# Patient Record
Sex: Male | Born: 1990
Health system: Southern US, Community
[De-identification: ages and names within clinical notes are randomized; demographics above are authoritative.]

## PROBLEM LIST (undated history)

## (undated) ENCOUNTER — Ambulatory Visit (HOSPITAL_COMMUNITY): Discharge status: Absent without leave | Payer: MEDICAID

## (undated) DIAGNOSIS — R569 Unspecified convulsions: Secondary | ICD-10-CM

## (undated) DIAGNOSIS — F819 Developmental disorder of scholastic skills, unspecified: Secondary | ICD-10-CM

## (undated) DIAGNOSIS — J45909 Unspecified asthma, uncomplicated: Secondary | ICD-10-CM

## (undated) DIAGNOSIS — I10 Essential (primary) hypertension: Secondary | ICD-10-CM

## (undated) HISTORY — DX: Developmental disorder of scholastic skills, unspecified: F81.9

## (undated) HISTORY — PX: OTHER SURGICAL HISTORY: SHX169

## (undated) HISTORY — DX: Essential (primary) hypertension: I10

---
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
## 2012-04-16 ENCOUNTER — Encounter (HOSPITAL_COMMUNITY): Payer: Self-pay | Admitting: *Deleted

## 2012-04-16 ENCOUNTER — Emergency Department (HOSPITAL_COMMUNITY)
Admission: EM | Admit: 2012-04-16 | Discharge: 2012-04-16 | Disposition: A | Discharge status: Home / Self Care | Payer: Medicaid Other | Attending: Emergency Medicine | Admitting: Emergency Medicine

## 2012-04-16 DIAGNOSIS — J069 Acute upper respiratory infection, unspecified: Secondary | ICD-10-CM | POA: Insufficient documentation

## 2012-04-16 DIAGNOSIS — R062 Wheezing: Secondary | ICD-10-CM

## 2012-04-16 DIAGNOSIS — Z79899 Other long term (current) drug therapy: Secondary | ICD-10-CM | POA: Insufficient documentation

## 2012-04-16 DIAGNOSIS — IMO0002 Reserved for concepts with insufficient information to code with codable children: Secondary | ICD-10-CM | POA: Insufficient documentation

## 2012-04-16 DIAGNOSIS — G40909 Epilepsy, unspecified, not intractable, without status epilepticus: Secondary | ICD-10-CM | POA: Insufficient documentation

## 2012-04-16 DIAGNOSIS — J45909 Unspecified asthma, uncomplicated: Secondary | ICD-10-CM | POA: Insufficient documentation

## 2012-04-16 DIAGNOSIS — Z8659 Personal history of other mental and behavioral disorders: Secondary | ICD-10-CM | POA: Insufficient documentation

## 2012-04-16 DIAGNOSIS — J3489 Other specified disorders of nose and nasal sinuses: Secondary | ICD-10-CM | POA: Insufficient documentation

## 2012-04-16 DIAGNOSIS — R51 Headache: Secondary | ICD-10-CM | POA: Insufficient documentation

## 2012-04-16 HISTORY — DX: Unspecified asthma, uncomplicated: J45.909

## 2012-04-16 HISTORY — DX: Unspecified convulsions: R56.9

## 2012-04-16 MED ORDER — ALBUTEROL SULFATE (5 MG/ML) 0.5% IN NEBU: 2.5000 mg | INHALATION_SOLUTION | Freq: Four times a day (QID) | RESPIRATORY_TRACT | Status: DC | PRN

## 2012-04-16 NOTE — ED Provider Notes (Signed)
History     CSN: [id number]  Arrival date & time 04/16/12  1825   First MD Initiated Contact with Patient 04/16/12 1955      Chief Complaint  Patient presents with  . Cough  . URI    (Consider location/radiation/quality/duration/timing/severity/associated sxs/prior treatment) HPI Comments: Patient with URI symptoms and wheezing intermittently for the past 2 days has been give OTC Mucinox and and Albuterol neb treatment Has remote Hx asthma  Also has an area on the back of his head that has been sore intermittently for many months without any sign of trauma, redness   Patient is a 21 y.o. male presenting with cough and URI. The history is provided by the patient.  Cough This is a new problem. Associated symptoms include headaches, rhinorrhea and wheezing. Pertinent negatives include no chills.  URI The primary symptoms include headaches, cough and wheezing. Primary symptoms do not include fever, vomiting or rash.  Symptoms associated with the illness include rhinorrhea. The illness is not associated with chills.    Past Medical History  Diagnosis Date  . Mental handicap   . Asthma   . Seizures     History reviewed. No pertinent past surgical history.  History reviewed. No pertinent family history.  History  Substance Use Topics  . Smoking status: Not on file  . Smokeless tobacco: Not on file  . Alcohol Use:       Review of Systems  Constitutional: Negative for fever and chills.  HENT: Positive for rhinorrhea.   Respiratory: Positive for cough and wheezing.   Gastrointestinal: Negative for vomiting.  Skin: Negative for rash and wound.  Neurological: Positive for headaches.    Allergies  Dilantin and Chloral hydrate  Home Medications   Current Outpatient Rx  Name  Route  Sig  Dispense  Refill  . FELBAMATE 600 MG PO TABS   Oral   Take 300-600 mg by mouth 3 (three) times daily. Takes 600mg  in the am, 300mg  in the afternoon and 600mg  in the pm         .  LAMOTRIGINE 150 MG PO TABS   Oral   Take 150 mg by mouth 2 (two) times daily. Take with lamictal 25mg =175mg          . LAMOTRIGINE 25 MG PO TABS   Oral   Take 25 mg by mouth 2 (two) times daily. Take with lamictal 150mg =175mg          . LEVETIRACETAM 250 MG PO TABS   Oral   Take 250 mg by mouth 2 (two) times daily.         Marland Kitchen PREDNISONE 5 MG PO TABS   Oral   Take 5 mg by mouth daily.         . ALBUTEROL SULFATE (5 MG/ML) 0.5% IN NEBU   Nebulization   Take 0.5 mLs (2.5 mg total) by nebulization every 6 (six) hours as needed for wheezing.   20 mL   12     BP 117/70  Pulse 87  Temp 97.8 F (36.6 C) (Oral)  Resp 20  SpO2 96%  Physical Exam  Constitutional: He appears well-developed and well-nourished.       MR no non conversational   HENT:  Head: Normocephalic.    Eyes: Pupils are equal, round, and reactive to light.  Neck: Normal range of motion.  Cardiovascular: Normal rate.   Pulmonary/Chest: No respiratory distress. He has no wheezes. He exhibits no tenderness.  Abdominal: Soft. Bowel sounds are normal.  Musculoskeletal: Normal range of motion.  Neurological: He is alert.  Skin: Skin is warm.    ED Course  Procedures (including critical care time)  Labs Reviewed - No data to display No results found.   1. URI (upper respiratory infection)   2. Wheezing       MDM   URI symptoms with reactive airway         Arman Filter, NP 04/16/12 2048

## 2012-04-16 NOTE — ED Notes (Signed)
Mom reports that pt having cough and cold symptoms since yesterday, gave him a breathing tx at home. No resp distress noted at triage. Pt also complaining recently of headache, pt has a handicap and unable to describe pain or symptoms.

## 2012-04-21 NOTE — ED Provider Notes (Signed)
Medical screening examination/treatment/procedure(s) were performed by non-physician practitioner and as supervising physician I was immediately available for consultation/collaboration.  Alainna Stawicki, MD 04/21/12 0131 

## 2012-10-18 ENCOUNTER — Other Ambulatory Visit: Payer: Self-pay

## 2012-10-18 DIAGNOSIS — G40309 Generalized idiopathic epilepsy and epileptic syndromes, not intractable, without status epilepticus: Secondary | ICD-10-CM

## 2012-10-18 MED ORDER — FELBATOL 600 MG PO TABS: ORAL_TABLET | ORAL | Status: DC

## 2012-10-18 MED ORDER — LAMICTAL 25 MG PO TABS: ORAL_TABLET | ORAL | Status: DC

## 2012-10-18 MED ORDER — LAMICTAL 150 MG PO TABS: ORAL_TABLET | ORAL | Status: DC

## 2012-10-18 NOTE — Telephone Encounter (Signed)
Rxs were sent to [facility] fax # [phone].

## 2012-10-18 NOTE — Telephone Encounter (Signed)
Pharmacy called and said pt transferred Rx's over there and that they need new Rx's with BMN on it.

## 2012-11-07 ENCOUNTER — Telehealth: Payer: Self-pay

## 2012-11-07 DIAGNOSIS — G40309 Generalized idiopathic epilepsy and epileptic syndromes, not intractable, without status epilepticus: Secondary | ICD-10-CM

## 2012-11-07 MED ORDER — PREDNISONE 5 MG PO TABS: ORAL_TABLET | ORAL | Status: DC

## 2012-11-07 MED ORDER — LEVETIRACETAM 500 MG PO TABS: ORAL_TABLET | ORAL | Status: DC

## 2012-11-07 NOTE — Telephone Encounter (Signed)
[name] lvm stating that pt needed refills on Keppra and Prednisone sent to [facility] on [address]. I called mom to see why she would need a refill on Prednisone and she said that Dr. Rexene Edison put child on it at a low dose for seizures and that he has been taking it for years. Please call mom with any questions at [phone].

## 2012-11-07 NOTE — Telephone Encounter (Signed)
The Rx's are ready for [employer]. Please fax to [facility] as requested. TG

## 2012-11-17 ENCOUNTER — Encounter: Payer: Self-pay | Admitting: Family

## 2012-11-17 DIAGNOSIS — R269 Unspecified abnormalities of gait and mobility: Secondary | ICD-10-CM | POA: Insufficient documentation

## 2012-11-17 DIAGNOSIS — R6250 Unspecified lack of expected normal physiological development in childhood: Secondary | ICD-10-CM | POA: Insufficient documentation

## 2012-11-17 DIAGNOSIS — F71 Moderate intellectual disabilities: Secondary | ICD-10-CM | POA: Insufficient documentation

## 2012-11-17 DIAGNOSIS — F848 Other pervasive developmental disorders: Secondary | ICD-10-CM

## 2012-11-17 DIAGNOSIS — G40309 Generalized idiopathic epilepsy and epileptic syndromes, not intractable, without status epilepticus: Secondary | ICD-10-CM | POA: Insufficient documentation

## 2012-11-18 ENCOUNTER — Ambulatory Visit: Payer: Self-pay | Admitting: Family

## 2012-12-29 ENCOUNTER — Other Ambulatory Visit: Payer: Self-pay

## 2012-12-29 DIAGNOSIS — G40309 Generalized idiopathic epilepsy and epileptic syndromes, not intractable, without status epilepticus: Secondary | ICD-10-CM

## 2012-12-29 MED ORDER — LAMICTAL 150 MG PO TABS: ORAL_TABLET | ORAL | Status: DC

## 2012-12-29 MED ORDER — LAMICTAL 25 MG PO TABS: ORAL_TABLET | ORAL | Status: DC

## 2012-12-29 MED ORDER — FELBATOL 600 MG PO TABS: ORAL_TABLET | ORAL | Status: DC

## 2013-01-20 ENCOUNTER — Ambulatory Visit (INDEPENDENT_AMBULATORY_CARE_PROVIDER_SITE_OTHER): Payer: Medicaid Other | Admitting: Family

## 2013-01-20 ENCOUNTER — Encounter: Payer: Self-pay | Admitting: Family

## 2013-01-20 VITALS — BP 120/70 | HR 80 | Ht 66.0 in | Wt 143.4 lb

## 2013-01-20 DIAGNOSIS — G40309 Generalized idiopathic epilepsy and epileptic syndromes, not intractable, without status epilepticus: Secondary | ICD-10-CM

## 2013-01-20 DIAGNOSIS — F848 Other pervasive developmental disorders: Secondary | ICD-10-CM

## 2013-01-20 DIAGNOSIS — F71 Moderate intellectual disabilities: Secondary | ICD-10-CM

## 2013-01-20 DIAGNOSIS — R269 Unspecified abnormalities of gait and mobility: Secondary | ICD-10-CM

## 2013-01-20 NOTE — Patient Instructions (Signed)
Continue your medications without change. If the arm spasms become more prominent, frequent or painful, call and let me know.  Please plan to follow up in 6 months or sooner if needed.

## 2013-01-20 NOTE — Progress Notes (Signed)
Patient: [name] MRN: [id number] Sex: male DOB: 12/11/1990  Provider: Elveria Rising, NP Location of Care: [facility]  Note type: Routine return visit  History of Present Illness: Referral Source: Dr. Maudie Flakes History from: Mother Chief Complaint: Seizures  [name] is a 22 y.o. 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 This was later thought to be gastroesophageal reflux. Nissen fundoplication caused vomiting to stop. He has organic gait disorder, essential tremor, and stuttering. He is taking and tolerating Felbatol, Lamictal and Keppra for his seizure disorder.   His mother says that he has had no overt seizures since last seen in August, 2013. He has had some brief, infrequent involuntary movements of his right arm. These have not changed in frequency or severity. [name] says that they are not painful and that he doesn't notice them.   Mom says that he is doing ok in school. He enjoys playing basketball at home with his friends.  Review of Systems: 12 system review was remarkable for allergies and seizure  Past Medical History  Diagnosis Date  . Intellectual delay   . Asthma   . Seizures    Hospitalizations: yes, Head Injury: no, Nervous System Infections: no, Immunizations up to date: yes Past Medical History Comments: The patient has been treated with broad-spectrum antiepileptic drugs and prednisone since he came to this community in 2004.  He has been admitted to [facility] with recurrent seizures.  He was hospitalized November 08, 2004 with involuntary movements that were choreiform in nature.  He has significant cognitive impairments, pervasive developmental delays in many domains, but no focal or lateralized neurologic findings.  MRI performed October 11, 2002  was normal.  EEG Oct 05 2002 showed diffuse background slowing and multifocal sharply contoured slow-wave is principally in the central, parietal and temporal regions, right greater than left.  He has organic gait disorder, essential tremor, and stuttering,  He had episodic vomiting. Nissen fundoplication caused vomiting to stop.   Surgical History Past Surgical History  Procedure Laterality Date  . Nissen fundoplication  April 2009    Family History family history is not on file. He has a nephew that is being evaluated for possible bipolar disorder. Family History is negative migraines, seizures, cognitive impairment, blindness, deafness, birth defects, chromosomal disorder, autism.  Social History History   Social History  . Marital Status: Single    Spouse Name: N/A    Number of Children: N/A  . Years of Education: N/A   Social History Main Topics  . Smoking status: Never Smoker   . Smokeless tobacco: None  . Alcohol Use: No  . Drug Use: No  . Sexual Activity: No   Other Topics Concern  . None   Social History Narrative  . None   Educational level 12th grade School Attending: [employer]. Occupation: Consulting civil engineer  Living with mother  Hobbies/Interest: Basketball School comments Dreshaun is doing well in school.  Current Outpatient Prescriptions on File Prior to Visit  Medication Sig Dispense Refill  . albuterol (PROVENTIL) (5 MG/ML) 0.5% nebulizer solution Take 0.5 mLs (2.5 mg total) by nebulization every 6 (six) hours as needed for wheezing.  20 mL  12  . FELBATOL 600 MG tablet Take 1 tab by mouth every morning, 1/2 tab every afternoon and 1 tab every evening  78 tablet  0  . LAMICTAL 150 MG tablet Take 1 tab by mouth twice daily.  60 tablet  0  . LAMICTAL 25 MG tablet Take 1 tab by mouth twice daily  60 tablet  0  . levETIRAcetam (KEPPRA) 500 MG tablet Take 1/2 tablet twice per day  Brand Medically Necessary  30 tablet  0  . predniSONE (DELTASONE) 5 MG  tablet Take 1 tab by mouth every morning  31 tablet  0   No current facility-administered medications on file prior to visit.   The medication list was reviewed and reconciled. All changes or newly prescribed medications were explained.  A complete medication list was provided to the patient/caregiver.  Allergies  Allergen Reactions  . Dilantin [Phenytoin Sodium Extended] Other (See Comments)    hallucinations  . Chloral Hydrate Hives and Rash    Physical Exam BP 120/70  Pulse 80  Ht 5\' 6"  (1.676 m)  Wt 143 lb 6.4 oz (65.046 kg)  BMI 23.16 kg/m2 General: alert, well developed, well nourished, in no acute distress brown hair, round eyes, right-handed Head: normocephalic, no dysmorphic features. He has an occipital protuberance that is unchanged. Ears, Nose and Throat: Otoscopic: tympanic membranes normal .  Pharynx: oropharynx is pink without exudates or tonsillar hypertrophy. Neck: supple, full range of motion, no cranial or cervical bruits Respiratory: auscultation clear Cardiovascular: no murmurs, pulses are normal Musculoskeletal: no skeletal deformities or apparent scoliosis Skin: no rashes or neurocutaneous lesions, he has multiple scars on his face.  Neurologic Exam  Mental Status: alert; Poor eye contact, slow to respond to commands, limited verbal output with dysarthria Cranial Nerves: visual fields are full to double simultaneous stimuli; extraocular movements are full and conjugate; pupils are round reactive to light; funduscopic examination shows sharp disc margins with normal vessels; symmetric facial strength; midline tongue and uvula; hearing is intact and symmetric Motor: Normal functional strength, tone, and mass; good fine motor movements; no pronator drift. Sensory: intact responses to touch and temperature  Coordination: good finger-to-nose, rapid repetitive alternating movements and finger apposition   Gait and Station:  broad-based but stable gait and station;  patient has difficulty walking on his heels, toes and tandems with difficulty; balance is poor; Romberg exam is negative; Gower response is negative Reflexes: symmetric and diminished bilaterally; no clonus; bilateral flexor plantar responses.   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 He is taking and tolerating Felbatol, Lamictal and Keppra for his seizure disorder.  He has had some brief, infrequent involuntary movements of his right arm that are not painful or problematic to him. We will make no changes in his plan of care at this time. I will see him back in follow up in 6 months or sooner if needed.

## 2013-02-10 ENCOUNTER — Other Ambulatory Visit: Payer: Self-pay | Admitting: Family

## 2013-02-10 DIAGNOSIS — G40309 Generalized idiopathic epilepsy and epileptic syndromes, not intractable, without status epilepticus: Secondary | ICD-10-CM

## 2013-02-10 DIAGNOSIS — F71 Moderate intellectual disabilities: Secondary | ICD-10-CM

## 2013-02-10 DIAGNOSIS — R269 Unspecified abnormalities of gait and mobility: Secondary | ICD-10-CM

## 2013-02-10 DIAGNOSIS — F849 Pervasive developmental disorder, unspecified: Secondary | ICD-10-CM

## 2013-03-16 ENCOUNTER — Other Ambulatory Visit: Payer: Self-pay

## 2013-03-16 DIAGNOSIS — G40309 Generalized idiopathic epilepsy and epileptic syndromes, not intractable, without status epilepticus: Secondary | ICD-10-CM

## 2013-03-16 MED ORDER — LEVETIRACETAM 500 MG PO TABS: ORAL_TABLET | ORAL | Status: DC

## 2013-03-16 MED ORDER — LAMICTAL 150 MG PO TABS: ORAL_TABLET | ORAL | Status: DC

## 2013-03-16 MED ORDER — LAMICTAL 25 MG PO TABS: ORAL_TABLET | ORAL | Status: DC

## 2013-03-16 MED ORDER — FELBATOL 600 MG PO TABS: ORAL_TABLET | ORAL | Status: DC

## 2013-03-16 MED ORDER — PREDNISONE 5 MG PO TABS: ORAL_TABLET | ORAL | Status: DC

## 2013-04-25 ENCOUNTER — Other Ambulatory Visit: Payer: Self-pay | Admitting: Pediatrics

## 2013-04-25 ENCOUNTER — Ambulatory Visit
Admission: RE | Admit: 2013-04-25 | Discharge: 2013-04-25 | Disposition: A | Discharge status: Home / Self Care | Payer: Medicaid Other | Source: Ambulatory Visit | Attending: Pediatrics | Admitting: Pediatrics

## 2013-04-25 DIAGNOSIS — IMO0001 Reserved for inherently not codable concepts without codable children: Secondary | ICD-10-CM

## 2013-07-31 ENCOUNTER — Ambulatory Visit (INDEPENDENT_AMBULATORY_CARE_PROVIDER_SITE_OTHER): Payer: Medicaid Other | Admitting: Family

## 2013-07-31 ENCOUNTER — Encounter: Payer: Self-pay | Admitting: Family

## 2013-07-31 VITALS — BP 122/68 | HR 82 | Ht 66.0 in | Wt 151.0 lb

## 2013-07-31 DIAGNOSIS — F71 Moderate intellectual disabilities: Secondary | ICD-10-CM

## 2013-07-31 DIAGNOSIS — L708 Other acne: Secondary | ICD-10-CM

## 2013-07-31 DIAGNOSIS — R269 Unspecified abnormalities of gait and mobility: Secondary | ICD-10-CM

## 2013-07-31 DIAGNOSIS — F848 Other pervasive developmental disorders: Secondary | ICD-10-CM

## 2013-07-31 DIAGNOSIS — G40309 Generalized idiopathic epilepsy and epileptic syndromes, not intractable, without status epilepticus: Secondary | ICD-10-CM

## 2013-07-31 DIAGNOSIS — L709 Acne, unspecified: Secondary | ICD-10-CM

## 2013-07-31 NOTE — Progress Notes (Signed)
Patient: [name] MRN: [id number] Sex: male DOB: Sep 14, 1990  Provider: Elveria Rising, NP Location of Care: [facility]  Note type: Routine return visit  History of Present Illness: Referral Source: [name] History from: [name] and [name] Chief Complaint: Seizures  [name] is a 23 y.o. 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 This was later thought to be gastroesophageal reflux. Nissen fundoplication caused vomiting to stop. He has organic gait disorder, essential tremor, and stuttering. He is taking and tolerating Felbatol, Lamictal and Keppra for his seizure disorder.  Today his [name] reports that he has had no seizures since he was last seen in September 2014. He has not had seizures in many years and [name] has made the decision to keep him on medication since he has been stable. He has brief involuntary movements of his right arm that have been ongoing for several years. These have not proven to be seizure activity. [name] says that they do not bother him.   [name] is doing well in school. There are no problems with behavior. He will graduate with a certificate in June. His [name] is unsure what he will do after graduation because of limited finances. She is concerned today because he has acne on his cheeks that has worsened. She says that she has asked his PCP for a referral to a dermatologist. She said that [name] has been otherwise healthy since last seen.   Review of Systems: 12 system review was unremarkable  Past Medical History  Diagnosis Date  . Intellectual delay   . Asthma   . Seizures    Hospitalizations: no, Head Injury: no, Nervous System Infections: no, Immunizations up to date: yes Past Medical History Comments: The  patient has been treated with broad-spectrum antiepileptic drugs and prednisone since he came to this community in 2004. He has been admitted to [facility] with recurrent seizures. He was hospitalized November 08, 2004 with involuntary movements that were choreiform in nature. He has significant cognitive impairments, pervasive developmental delays in many domains, but no focal or lateralized neurologic findings.  MRI performed October 11, 2002 was normal. EEG Oct 05 2002 showed diffuse background slowing and multifocal sharply contoured slow-wave is principally in the central, parietal and temporal regions, right greater than left.  He has organic gait disorder, essential tremor, and stuttering,  He had episodic vomiting. Nissen fundoplication caused vomiting to stop.   Surgical History Past Surgical History  Procedure Laterality Date  . Nissen fundoplication  April 2009    Family History family history is not on file. Family History is otherwise negative for migraines, seizures, cognitive impairment, blindness, deafness, birth defects, chromosomal disorder, autism.  Social History History   Social History  . Marital Status: Single    Spouse Name: N/A    Number of Children: N/A  . Years of Education: N/A   Social History Main Topics  . Smoking status: Never Smoker   . Smokeless tobacco: Never Used  . Alcohol Use: No  . Drug Use: No  . Sexual Activity: No   Other Topics Concern  . None   Social History Narrative  . None   Educational level: 12th grade special education School Attending: [name] [name] Living with:  [name]  Hobbies/Interest: Enjoys playing basketball, riding his bike and he is a  huge wrestling fan.  School comments:  [name] is doing well in school he will graduate June of 2015.  Physical Exam BP 122/68  Pulse 82  Ht 5\' 6"  (1.676 m)  Wt 151 lb (68.493 kg)  BMI 24.38 kg/m2 General: alert, well developed, well nourished, in no acute distress  brown hair, round eyes, right-handed  Head: normocephalic, no dysmorphic features. He has an occipital protuberance that is unchanged.  Ears, Nose and Throat: Otoscopic: tympanic membranes normal . Pharynx: oropharynx is pink without exudates or tonsillar hypertrophy.  Neck: supple, full range of motion, no cranial or cervical bruits  Respiratory: auscultation clear  Cardiovascular: no murmurs, pulses are normal  Musculoskeletal: no skeletal deformities or apparent scoliosis  Skin: no rashes or neurocutaneous lesions, he has acne and multiple scars on his face.   Neurologic Exam  Mental Status: alert; Poor eye contact, slow to respond to commands, limited verbal output with dysarthria  Cranial Nerves: visual fields are full to double simultaneous stimuli; extraocular movements are full and conjugate; pupils are round reactive to light; funduscopic examination shows sharp disc margins with normal vessels; symmetric facial strength; midline tongue and uvula; hearing is intact and symmetric  Motor: Normal functional strength, tone, and mass; good fine motor movements; no pronator drift.  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 He has had no seizures in many years and will continue on his medications without change. I agree with his [name] than he needs to see a dermatologist for  his acne. I will see him back in follow up in 6 months or sooner if needed.

## 2013-08-02 ENCOUNTER — Encounter: Payer: Self-pay | Admitting: Family

## 2013-08-02 DIAGNOSIS — L709 Acne, unspecified: Secondary | ICD-10-CM | POA: Insufficient documentation

## 2013-08-02 NOTE — Patient Instructions (Signed)
Continue [name]' medications without change for now. Let me know if he has any breakthrough seizures.  Please plan to return for follow up in 6 months or sooner if needed.

## 2014-01-16 ENCOUNTER — Telehealth: Payer: Self-pay | Admitting: Family

## 2014-01-16 NOTE — Telephone Encounter (Signed)
Mom [name] left a message about [name] saying that the school switched his bus route and he is riding a bus now for 1+1/2 hrs each way. Mom said that he gets agitated & can't tolerate that amount of time on the bus. She said that Dr Sharene Skeans has written a note before to school saying that he should not ride a bus for no more than 1 hour. She needs a note faxed to attention [employer]ymes fax [phone]. Mom can be reached at  [phone]. I called mom and talked with her. I told her that I would write a note and have Dr Sharene Skeans sign it. TG

## 2014-01-16 NOTE — Telephone Encounter (Signed)
The letter was written and faxed to school as requested. TG

## 2014-01-17 NOTE — Telephone Encounter (Signed)
Thanks

## 2014-01-17 NOTE — Telephone Encounter (Signed)
The fax would not go through so I called Mom and verified the fax number. The correct fax number for the school is [phone]. The letter was faxed as requested. TG

## 2014-01-23 ENCOUNTER — Encounter: Payer: Self-pay | Admitting: Family

## 2014-01-23 ENCOUNTER — Ambulatory Visit (INDEPENDENT_AMBULATORY_CARE_PROVIDER_SITE_OTHER): Payer: Medicaid Other | Admitting: Family

## 2014-01-23 VITALS — BP 118/70 | HR 80 | Ht 66.75 in | Wt 151.4 lb

## 2014-01-23 DIAGNOSIS — F71 Moderate intellectual disabilities: Secondary | ICD-10-CM

## 2014-01-23 DIAGNOSIS — R269 Unspecified abnormalities of gait and mobility: Secondary | ICD-10-CM

## 2014-01-23 DIAGNOSIS — F848 Other pervasive developmental disorders: Secondary | ICD-10-CM

## 2014-01-23 DIAGNOSIS — G40309 Generalized idiopathic epilepsy and epileptic syndromes, not intractable, without status epilepticus: Secondary | ICD-10-CM

## 2014-01-23 NOTE — Progress Notes (Signed)
Patient: [name] MRN: [id number] Sex: male DOB: February 05, 2022  Provider: Elveria Rising, NP Location of Care: [facility]  Note type: Routine return visit  History of Present Illness: Referral Source: Dr. Maudie Flakes History from: his mother Chief Complaint: Seizures  [name] is a 23 y.o. 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 This was later thought to be gastroesophageal reflux. Nissen fundoplication caused vomiting to stop. He has organic gait disorder, essential tremor, and stuttering. [name] was last seen July 31, 2013.   [name] is taking and tolerating Felbatol, Lamictal and Keppra for his seizure disorder. Today his mother reports that he has had no seizures since he was last seen. He has not had seizures in many years and Dr Sharene Skeans has made the decision to keep him on medication since he has been stable. He has brief involuntary movements of his right arm that have been ongoing for several years. These have not proven to be seizure activity. [name] says that they do not bother him.   [name] graduated with a certificate in June, and was eligible to continue to take classes for 3 years so he is doing so. He has been otherwise healthy since last seen.   Review of Systems: 12 system review was unremarkable  Past Medical History  Diagnosis Date  . Intellectual delay   . Asthma   . Seizures    Hospitalizations: No., Head Injury: No., Nervous System Infections: No., Immunizations up to date: Yes.   Past Medical History Comments: The patient has been treated with broad-spectrum antiepileptic drugs and prednisone since he came to this community in 2004. He has been admitted to [facility] with recurrent seizures. He was hospitalized November 08, 2004 with  involuntary movements that were choreiform in nature. He has significant cognitive impairments, pervasive developmental delays in many domains, but no focal or lateralized neurologic findings.  MRI performed October 11, 2002 was normal. EEG Oct 05 2002 showed diffuse background slowing and multifocal sharply contoured slow-wave is principally in the central, parietal and temporal regions, right greater than left.    Surgical History Past Surgical History  Procedure Laterality Date  . Nissen fundoplication  April 2009   Family History family history is not on file. Family History is otherwise negative for migraines, seizures, cognitive impairment, blindness, deafness, birth defects, chromosomal disorder, autism.  Social History History   Social History  . Marital Status: Single    Spouse Name: N/A    Number of Children: N/A  . Years of Education: N/A   Social History Main Topics  . Smoking status: Never Smoker   . Smokeless tobacco: Never Used  . Alcohol Use: No  . Drug Use: No  . Sexual Activity: No   Other Topics Concern  . None   Social History Narrative  . None   Educational level: 12th grade School Attending: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 He is taking courses at his high school.  Physical Exam BP 118/70  Pulse 80  Ht 5' 6.75" (1.695 m)  Wt 151 lb 6.4 oz (68.675 kg)  BMI 23.90 kg/m2 General: alert, well developed, well nourished, in no acute distress brown hair, round eyes, right-handed  Head: normocephalic, no dysmorphic features. He has an occipital protuberance that is unchanged.  Ears, Nose and Throat: Otoscopic: tympanic membranes normal . Pharynx: oropharynx is pink without exudates or tonsillar hypertrophy.  Neck: supple, full range of motion, no cranial or cervical bruits   Respiratory: auscultation clear  Cardiovascular: no murmurs, pulses are normal  Musculoskeletal: no skeletal deformities or apparent scoliosis  Skin: no rashes or neurocutaneous lesions, he has acne and multiple scars on his face.   Neurologic Exam  Mental Status: alert; Poor eye contact, slow to respond to commands, limited verbal output with dysarthria  Cranial Nerves: visual fields are full to double simultaneous stimuli; extraocular movements are full and conjugate; pupils are round reactive to light; funduscopic examination shows sharp disc margins with normal vessels; symmetric facial strength; midline tongue and uvula; hearing is intact and symmetric  Motor: Normal functional strength, tone, and mass; good fine motor movements; no pronator drift.  Sensory: intact responses to touch and temperature  Coordination: good finger-to-nose, rapid repetitive alternating movements and finger apposition  Gait and Station: broad-based but stable gait and station; patient has difficulty walking on his heels, toes and tandems with difficulty; balance is poor; Romberg exam is negative; Gower response is negative  Reflexes: symmetric and diminished bilaterally; no clonus; bilateral flexor plantar responses  Assessment and Plan [name] is a 23 year old young man with a history of infantile spasms and myoclonic seizures that he evolved into a mixed seizure disorder involving complex partial, and atypical absence seizures. He has had no seizures in many years and will continue on his medications without change. I will see him back in follow up in 6 months or sooner if needed.

## 2014-01-25 ENCOUNTER — Encounter: Payer: Self-pay | Admitting: Family

## 2014-01-25 NOTE — Patient Instructions (Signed)
[name] needs to continue his seizure medications without change. Call me if he has any seizures or if you have any concerns.   I will see him in follow up in 6 months or sooner if needed.

## 2014-04-24 ENCOUNTER — Other Ambulatory Visit: Payer: Self-pay

## 2014-04-24 DIAGNOSIS — G40309 Generalized idiopathic epilepsy and epileptic syndromes, not intractable, without status epilepticus: Secondary | ICD-10-CM

## 2014-04-24 MED ORDER — PREDNISONE 5 MG PO TABS: ORAL_TABLET | ORAL | Status: DC

## 2014-04-24 MED ORDER — LAMICTAL 150 MG PO TABS: ORAL_TABLET | ORAL | Status: DC

## 2014-04-24 MED ORDER — LEVETIRACETAM 500 MG PO TABS: ORAL_TABLET | ORAL | Status: DC

## 2014-04-24 MED ORDER — FELBATOL 600 MG PO TABS: ORAL_TABLET | ORAL | Status: DC

## 2014-04-24 MED ORDER — LAMICTAL 25 MG PO TABS
ORAL_TABLET | ORAL | Status: DC
Start: 2014-04-24 — End: 2014-08-20

## 2014-08-20 ENCOUNTER — Encounter: Payer: Self-pay | Admitting: Family

## 2014-08-20 ENCOUNTER — Ambulatory Visit (INDEPENDENT_AMBULATORY_CARE_PROVIDER_SITE_OTHER): Payer: Medicaid Other | Admitting: Family

## 2014-08-20 VITALS — BP 120/68 | HR 82 | Ht 66.75 in | Wt 155.0 lb

## 2014-08-20 DIAGNOSIS — R269 Unspecified abnormalities of gait and mobility: Secondary | ICD-10-CM

## 2014-08-20 DIAGNOSIS — R6250 Unspecified lack of expected normal physiological development in childhood: Secondary | ICD-10-CM

## 2014-08-20 DIAGNOSIS — F71 Moderate intellectual disabilities: Secondary | ICD-10-CM | POA: Diagnosis not present

## 2014-08-20 DIAGNOSIS — G40309 Generalized idiopathic epilepsy and epileptic syndromes, not intractable, without status epilepticus: Secondary | ICD-10-CM | POA: Diagnosis not present

## 2014-08-20 MED ORDER — LAMICTAL 150 MG PO TABS: ORAL_TABLET | ORAL | Status: DC

## 2014-08-20 MED ORDER — FELBATOL 600 MG PO TABS: ORAL_TABLET | ORAL | Status: DC

## 2014-08-20 MED ORDER — LAMICTAL 25 MG PO TABS: ORAL_TABLET | ORAL | Status: DC

## 2014-08-20 MED ORDER — KEPPRA 500 MG PO TABS: ORAL_TABLET | ORAL | Status: DC

## 2014-08-20 NOTE — Progress Notes (Signed)
Patient: [name] MRN: [id number] Sex: male DOB: 1990-12-18  Provider: Elveria Rising, NP Location of Care: [facility]  Note type: Routine return visit  History of Present Illness: Referral Source: Dr. Maudie Flakes  History from: patient and [facility] chart Chief Complaint: Seizures  [name] is a 24 y.o. 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 This was later thought to be gastroesophageal reflux. Nissen fundoplication caused vomiting to stop. He has organic gait disorder, essential tremor, and stuttering. He is taking and tolerating Felbatol, Lamictal and Keppra for his seizure disorder and had been seizure free for several years until this winter. [name] was last seen January 23, 2014.  Today his mother reports that he had 1 brief seizure since he was last seen. She said that it happened in January or February when he had the flu. [name] has brief involuntary movements of his right arm that have been ongoing for several years. These have not proven to be seizure activity and are not problematic to him.   [name] has been otherwise healthy since last seen. His mother's only concern today is about his vision, and she wonders if he should have an evaluation by an opthalmologist. She says that he has not had complaints about his vision, but that she wonders if he needs an evaluation as he has never had a formal eye examination.  Review of Systems: Please see the HPI for neurologic and other pertinent review of systems. Otherwise, the following systems are noncontributory including constitutional, eyes, ears, nose and throat, cardiovascular, respiratory, gastrointestinal, genitourinary, musculoskeletal, skin, endocrine, hematologic/lymph, allergic/immunologic and psychiatric.   Past  Medical History  Diagnosis Date  . Intellectual delay   . Asthma   . Seizures    Hospitalizations: No., Head Injury: No., Nervous System Infections: No., Immunizations up to date: Yes.   Past Medical History Comments:The patient has been treated with broad-spectrum antiepileptic drugs and prednisone since he came to this community in 2004. He has been admitted to [facility] with recurrent seizures. He was hospitalized November 08, 2004 with involuntary movements that were choreiform in nature. He has significant cognitive impairments, pervasive developmental delays in many domains, but no focal or lateralized neurologic findings.  MRI performed October 11, 2002 was normal. EEG Oct 05 2002 showed diffuse background slowing and multifocal sharply contoured slow-wave is principally in the central, parietal and temporal regions, right greater than left.   Surgical History Past Surgical History  Procedure Laterality Date  . Nissen fundoplication  April 2009    Family History family history is not on file. Family History is otherwise negative for migraines, seizures, cognitive impairment, blindness, deafness, birth defects, chromosomal disorder, autism.  Social History History   Social History  . Marital Status: Single    Spouse Name: N/A  . Number of Children: N/A  . Years of Education: N/A   Social History Main Topics  . Smoking status: Never Smoker   . Smokeless tobacco: Never Used  . Alcohol Use: No  . Drug Use: No  . Sexual Activity: No   Other Topics Concern  . None   Social History Narrative   Educational level: 12th grade School Attending: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 School  comments:  Master's mother reports that he is doing great in school in special education classes.  Allergies Allergies  Allergen Reactions  .  Dilantin [Phenytoin Sodium Extended] Other (See Comments)    hallucinations  . Chloral Hydrate Hives and Rash    Physical Exam BP 120/68 mmHg  Pulse 82  Ht 5' 6.75" (1.695 m)  Wt 155 lb (70.308 kg)  BMI 24.47 kg/m2 General: alert, well developed, well nourished, in no acute distress brown hair, round eyes, right-handed  Head: normocephalic, no dysmorphic features. He has an occipital protuberance that is unchanged.  Ears, Nose and Throat: Otoscopic: tympanic membranes normal . Pharynx: oropharynx is pink without exudates or tonsillar hypertrophy.  Neck: supple, full range of motion, no cranial or cervical bruits  Respiratory: auscultation clear  Cardiovascular: no murmurs, pulses are normal  Musculoskeletal: no skeletal deformities or apparent scoliosis  Skin: no rashes or neurocutaneous lesions, he has acne and multiple scars on his face.   Neurologic Exam  Mental Status: alert; Poor eye contact, slow to respond to commands, limited verbal output with dysarthria  Cranial Nerves: visual fields are full to double simultaneous stimuli; extraocular movements are full and conjugate; pupils are round reactive to light; funduscopic examination shows sharp disc margins with normal vessels; symmetric facial strength; midline tongue and uvula; hearing is intact and symmetric  Motor: Normal functional strength, tone, and mass; good fine motor movements; no pronator drift.  Sensory: intact responses to touch and temperature  Coordination: good finger-to-nose, rapid repetitive alternating movements and finger apposition  Gait and Station: broad-based but stable gait and station; patient has difficulty walking on his heels, toes and tandems with difficulty; balance is poor; Romberg exam is negative; Gower response is negative  Reflexes: symmetric and diminished bilaterally; no clonus; bilateral flexor plantar responses  Impression 1. 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 History of infantile spasms and myoclonic seizures 3. Cognitive delay 4. Organic gait disorder 5. Benign essential tremor 6. Speech disorder with dysarthria and stuttering   Recommendations for plan of care [facility]. [name] has neither had nor required imaging or lab studies since the last visit. 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 He had one seizure in January or February when he had the flu. We will make no changes in his medication since the seizure was triggered by illness. He will continue on his medications without change for now. I asked his mother to let me know if he has any more seizures. I talked with his mother about her question regarding him seeing an ophthalmologist and told her that it is good practice for adults to have an eye examination periodically. She will discuss a referral with his PCP. I will otherwise see him back in follow up in 6 months or sooner if needed.  The medication list was reviewed and reconciled.  No changes were made in the prescribed medications today.  A complete medication list was provided to his mother.  Total time spent with the patient was 25 minutes, of which 50% or more was spent in counseling and coordination of care.

## 2014-08-21 NOTE — Patient Instructions (Signed)
Continue giving [name] the medications for seizures as you have been giving them. Let me know if he has any more seizures.  Talk to his PCP about your questions regarding a referral for an eye examination.  Please plan to return for follow up in 6 months or sooner if needed.

## 2015-02-25 ENCOUNTER — Ambulatory Visit (INDEPENDENT_AMBULATORY_CARE_PROVIDER_SITE_OTHER): Payer: Medicaid Other | Admitting: Family

## 2015-02-25 ENCOUNTER — Encounter: Payer: Self-pay | Admitting: Family

## 2015-02-25 VITALS — BP 124/76 | HR 84 | Ht 66.0 in | Wt 154.4 lb

## 2015-02-25 DIAGNOSIS — R269 Unspecified abnormalities of gait and mobility: Secondary | ICD-10-CM

## 2015-02-25 DIAGNOSIS — G40309 Generalized idiopathic epilepsy and epileptic syndromes, not intractable, without status epilepticus: Secondary | ICD-10-CM

## 2015-02-25 DIAGNOSIS — R6250 Unspecified lack of expected normal physiological development in childhood: Secondary | ICD-10-CM

## 2015-02-25 DIAGNOSIS — F71 Moderate intellectual disabilities: Secondary | ICD-10-CM | POA: Diagnosis not present

## 2015-02-25 MED ORDER — PREDNISONE 5 MG PO TABS: ORAL_TABLET | ORAL | Status: DC

## 2015-02-25 MED ORDER — FELBATOL 600 MG PO TABS: ORAL_TABLET | ORAL | Status: DC

## 2015-02-25 MED ORDER — LAMICTAL 25 MG PO TABS: ORAL_TABLET | ORAL | Status: DC

## 2015-02-25 MED ORDER — LAMICTAL 150 MG PO TABS
ORAL_TABLET | ORAL | Status: DC
Start: 2015-02-25 — End: 2015-09-23

## 2015-02-25 MED ORDER — KEPPRA 500 MG PO TABS: ORAL_TABLET | ORAL | Status: DC

## 2015-02-25 NOTE — Progress Notes (Signed)
Patient: Rad Gramling MRN: [id number] Sex: male DOB: August 01, 1990  Provider: Elveria Rising, NP Location of Care: [facility]  Note type: Routine return visit  History of Present Illness: Referral Source: Dr. Theadore Nan History from: mother and [facility] chart Chief Complaint: Seizures  Ben Habermann is a 24 y.o. 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 This was later thought to be gastroesophageal reflux. Nissen fundoplication caused vomiting to stop. He has organic gait disorder, essential tremor, and stuttering. He is taking and tolerating Felbatol, Lamictal and Keppra for his seizure disorder and had been seizure free for several years until this winter. Aubra was last seen August 20, 2014.  Today his mother reports that Avraj has had no seizures since he was last seen. Tylan has brief involuntary movements of his right arm that have been ongoing for several years. These have not proven to be seizure activity and are not problematic to him. He sometimes complains of generalized scalp soreness but there are no visible scalp lesions present.   Davidmichael has been otherwise healthy since last seen. His mother has no other health concerns for him today other than previously mentioned.  Review of Systems: Please see the HPI for neurologic and other pertinent review of systems. Otherwise, the following systems are noncontributory including constitutional, eyes, ears, nose and throat, cardiovascular, respiratory, gastrointestinal, genitourinary, musculoskeletal, skin, endocrine, hematologic/lymph, allergic/immunologic and psychiatric.   Past Medical History  Diagnosis Date  . Intellectual delay   . Asthma   . Seizures (HCC)    Hospitalizations: No., Head Injury: No., Nervous System  Infections: No., Immunizations up to date: Yes.   Past Medical History Comments: The patient has been treated with broad-spectrum antiepileptic drugs and prednisone since he came to this community in 2004. He has been admitted to [facility] with recurrent seizures. He was hospitalized November 08, 2004 with involuntary movements that were choreiform in nature. He has significant cognitive impairments, pervasive developmental delays in many domains, but no focal or lateralized neurologic findings.  MRI performed October 11, 2002 was normal. EEG Oct 05 2002 showed diffuse background slowing and multifocal sharply contoured slow-wave is principally in the central, parietal and temporal regions, right greater than left.   Surgical History Past Surgical History  Procedure Laterality Date  . Nissen fundoplication  April 2009    Family History family history is not on file. Family History is otherwise negative for migraines, seizures, cognitive impairment, blindness, deafness, birth defects, chromosomal disorder, autism.  Allergies Allergies  Allergen Reactions  . Dilantin [Phenytoin Sodium Extended] Other (See Comments)    hallucinations  . Chloral Hydrate Hives and Rash    Physical Exam Ht  (1.676 m)  Wt 154 lb 6.4 oz (70.035 kg)  BMI 24.93 kg/m2 General: alert, well developed, well nourished, in no acute distress brown hair, round eyes, right-handed  Head: normocephalic, no dysmorphic features. He has an occipital protuberance that is unchanged.  Ears, Nose and Throat: Otoscopic: tympanic membranes normal . Pharynx: oropharynx is pink without exudates or tonsillar hypertrophy.  Neck: supple, full range of motion, no cranial or cervical bruits  Respiratory: auscultation clear  Cardiovascular: no murmurs, pulses are normal  Musculoskeletal: no skeletal deformities or apparent scoliosis  Skin: no rashes or neurocutaneous lesions, he has acne and multiple scars on his face.    Neurologic Exam  Mental Status: alert; Poor eye contact, slow to respond to commands, limited verbal output with dysarthria  Cranial Nerves: visual fields are full to double simultaneous stimuli; extraocular movements are full and conjugate; pupils are round reactive to light; funduscopic examination shows sharp disc margins with normal vessels; symmetric facial strength; midline tongue and uvula; hearing is intact and symmetric  Motor: Normal functional strength, tone, and mass; good fine motor movements; no pronator drift.  Sensory: intact responses to touch and temperature  Coordination: good finger-to-nose, rapid repetitive alternating movements and finger apposition  Gait and Station: broad-based but stable gait and station; patient has difficulty walking on his heels, toes and tandems with difficulty; balance is poor; Romberg exam is negative; Gower response is negative  Reflexes: symmetric and diminished bilaterally; no clonus; bilateral flexor plantar responses  Impression 1. 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 History of infantile spasms and myoclonic seizures 3. Cognitive delay 4. Organic gait disorder 5. Benign essential tremor 6. Speech disorder with dysarthria and stuttering   Recommendations for plan of care [facility]. [name] has neither had nor required imaging or lab studies since the last visit. 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 He has had no seizures since last seen in April 2016. He will continue on his medications without change for now. I asked his mother to let me know if he has any breakthrough seizures. I will see him back in follow up in  6 months or sooner if needed  The medication list was reviewed and reconciled.  No changes were made in the prescribed medications today.  A complete medication list was provided to the patient's mother.  Total time spent with the patient was 20 minutes, of which 50% or more was spent in counseling and coordination of care.

## 2015-02-27 NOTE — Patient Instructions (Signed)
[name] should continue his medication as he has been taking it. Let me know if he has any seizures or if you have any other concerns.   Please plan to return for follow up in 6 months or sooner if needed.

## 2015-08-16 ENCOUNTER — Encounter: Payer: Self-pay | Admitting: Family

## 2015-09-23 ENCOUNTER — Encounter: Payer: Self-pay | Admitting: Pediatrics

## 2015-09-23 ENCOUNTER — Ambulatory Visit (INDEPENDENT_AMBULATORY_CARE_PROVIDER_SITE_OTHER): Payer: Medicaid Other | Admitting: Pediatrics

## 2015-09-23 VITALS — BP 110/80 | HR 80 | Ht 66.0 in | Wt 157.8 lb

## 2015-09-23 DIAGNOSIS — F71 Moderate intellectual disabilities: Secondary | ICD-10-CM | POA: Diagnosis not present

## 2015-09-23 DIAGNOSIS — G40309 Generalized idiopathic epilepsy and epileptic syndromes, not intractable, without status epilepticus: Secondary | ICD-10-CM | POA: Diagnosis not present

## 2015-09-23 MED ORDER — FELBATOL 600 MG PO TABS: ORAL_TABLET | ORAL | Status: DC

## 2015-09-23 MED ORDER — PREDNISONE 5 MG PO TABS: ORAL_TABLET | ORAL | Status: DC

## 2015-09-23 MED ORDER — LAMICTAL 25 MG PO TABS: ORAL_TABLET | ORAL | Status: DC

## 2015-09-23 MED ORDER — KEPPRA 500 MG PO TABS: ORAL_TABLET | ORAL | Status: DC

## 2015-09-23 MED ORDER — LAMICTAL 150 MG PO TABS: ORAL_TABLET | ORAL | Status: DC

## 2015-09-23 NOTE — Progress Notes (Signed)
Patient: [name] MRN: [id number] Sex: male DOB: 04/12/1991  Provider: Deetta PerlaHICKLING,WILLIAM H, MD [facility]  Note type: Routine return visit  History of Present Illness: Referral Source: Dr. Theadore NanHilary McCormick History from: mother, patient and [facility] Chief Complaint: Seizures  [name] is a 25 y.o. male who returns on Sep 23, 2015 for the first time since February 25, 2015.  I have not seen him in a while.  He has been followed by my nurse practitioner, Elveria Risingina Goodpasture.  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  He is on three antiepileptic medications as well as prednisone and has been unwilling to take him off that medication for all these years because it is not causing Cushingoid response and every time it was discontinued he started to have seizures.  His mother says that he has episodes where he will flex his hand at the wrist and seem to be unable to control it.  I do not think that dystonic posturing occurs when he loses consciousness and I suspect that it is not a seizure.  This was mentioned back in October and was thought not represent a seizure.  Given that he has not lost contact with his world or had a convulsion, I do not want to change his medications.  This is his last year it has been high school.  He will be graduating soon and his mother is excited about taking to the prom.  He has a Dance movement psychotherapistmentor who has been working with him and trying to acquire gainful skills for work.  His health has been good.  He is sleeping well.  No other concerns were raised today.  Review of Systems: 12 system review was assessed and was negative  Past Medical History Diagnosis Date  . Intellectual delay   . Asthma   . Seizures (HCC)    Hospitalizations: No., Head Injury: No., Nervous  System Infections: No., Immunizations up to date: Yes.    Treated with broad-spectrum antiepileptic drugs and prednisone since he came to this community in 2004. [facility] with recurrent seizures. He was hospitalized November 08, 2004 with involuntary movements that were choreiform in nature. He has significant cognitive impairments, pervasive developmental delays in many domains, but no focal or lateralized neurologic findings.  MRI performed October 11, 2002 was normal. EEG Oct 05 2002 showed diffuse background slowing and multifocal sharply contoured slow-wave is principally in the central, parietal and temporal regions, right greater than left.   Behavior History none  Surgical History Procedure Laterality Date  . Nissen fundoplication  April 2009   Family History family history is not on file. Family history is negative for migraines, seizures, intellectual disabilities, blindness, deafness, birth defects, chromosomal disorder, or autism.  Social History . Marital Status: Single    Spouse Name: N/A  . Number of Children: N/A  . Years of Education: N/A   Social History Main Topics  . Smoking status: Never Smoker   . Smokeless tobacco: Never Used  . Alcohol Use: No  . Drug Use: No  . Sexual Activity: No   Social History Narrative    [name] is a 12th grade student at [employer], he does well in school. He lives with his mother, sister, and niece. He enjoys basketball, riding his bike, and watching wrestling.  Allergies Allergen Reactions  . Dilantin [Phenytoin Sodium Extended] Other (See Comments)    hallucinations  . Chloral Hydrate Hives and Rash   Physical Exam BP 110/80 mmHg  Pulse 80  Ht  (1.676 m)  Wt 157 lb 12.8 oz (71.578 kg)  BMI 25.48 kg/m2  General: alert, well developed, well nourished, in no acute distress, brown hair, brown eyes, right handed Head: normocephalic, no dysmorphic features Ears, Nose and Throat:  Otoscopic: tympanic membranes normal; pharynx: oropharynx is pink without exudates or tonsillar hypertrophy Neck: supple, full range of motion, no cranial or cervical bruits Respiratory: auscultation clear Cardiovascular: no murmurs, pulses are normal Musculoskeletal: no skeletal deformities or apparent scoliosis Skin: no neurocutaneous lesions  Neurologic Exam  Mental Status: alert; oriented to person; knowledge is belownormal for age; language is below normal; dysarthria Cranial Nerves: visual fields are full to double simultaneous stimuli; extraocular movements are full and conjugate; pupils are round reactive to light; funduscopic examination shows sharp disc margins with normal vessels; symmetric facial strength; midline tongue and uvula; air conduction is greater than bone conduction bilaterally Motor: Normal strength, tone and mass; good fine motor movements; no pronator drift Sensory: intact responses to cold, vibration, proprioception and stereognosis Coordination: good finger-to-nose, rapid repetitive alternating movements and finger apposition Gait and Station: normal gait and station: patient has difficulty walking on heels, toes and tandem with difficulty; balance is poor; Romberg exam is negative; Gower response is negative Reflexes: symmetric and diminished bilaterally; no clonus; bilateral flexor plantar responses  Assessment 1. 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 3. 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  Discussion I am pleased with how [name] is doing.    Plan I refilled prescriptions for Felbatol, Keppra, and both Lamictal tablets as well as prednisone.  The latter was electronically sent; the other four because they are trade drug were printed.  He will return to see me in six months' time.  I spent 30 minutes of face-to-face time with Zi and his mother, more than half of it in consultation.   Medication  List   albuterol (5 MG/ML) 0.5% nebulizer solution  Commonly known as:  PROVENTIL  Take 0.5 mLs (2.5 mg total) by nebulization every 6 (six) hours as needed for wheezing.     AMBULATORY NON FORMULARY MEDICATION  Clavaris  daily for acne     cetirizine 10 MG tablet  Commonly known as:  ZYRTEC  Take 10 mg by mouth as needed.     FELBATOL 600 MG tablet  Generic drug:  felbamate  Take 1 tab by mouth every morning, 1/2 tab every afternoon and 1 tab every evening     ibuprofen 400 MG tablet  Commonly known as:  ADVIL,MOTRIN  Reported on 09/23/2015     KEPPRA 500 MG tablet  Generic drug:  levETIRAcetam  Take 1/2 tablet twice per day     LAMICTAL 25 MG tablet  Generic drug:  lamoTRIgine  Take 1 tab by mouth twice daily     LAMICTAL 150 MG tablet  Generic drug:  lamoTRIgine  Take 1 tab by mouth twice daily.     predniSONE 5 MG tablet  Commonly known as:  DELTASONE  Take 1 tab by mouth every morning      The medication list was reviewed and reconciled. All changes or newly prescribed medications were explained.  A complete medication list was provided to the patient/caregiver.  Deetta Perla MD

## 2015-10-10 ENCOUNTER — Emergency Department (HOSPITAL_COMMUNITY)
Admission: EM | Admit: 2015-10-10 | Discharge: 2015-10-10 | Disposition: A | Discharge status: Home / Self Care | Payer: Medicaid Other | Attending: Emergency Medicine | Admitting: Emergency Medicine

## 2015-10-10 ENCOUNTER — Encounter (HOSPITAL_COMMUNITY): Payer: Self-pay

## 2015-10-10 DIAGNOSIS — R569 Unspecified convulsions: Secondary | ICD-10-CM | POA: Diagnosis present

## 2015-10-10 DIAGNOSIS — J45909 Unspecified asthma, uncomplicated: Secondary | ICD-10-CM | POA: Insufficient documentation

## 2015-10-10 DIAGNOSIS — Z7952 Long term (current) use of systemic steroids: Secondary | ICD-10-CM | POA: Insufficient documentation

## 2015-10-10 DIAGNOSIS — Z8659 Personal history of other mental and behavioral disorders: Secondary | ICD-10-CM | POA: Insufficient documentation

## 2015-10-10 DIAGNOSIS — Z79899 Other long term (current) drug therapy: Secondary | ICD-10-CM | POA: Diagnosis not present

## 2015-10-10 LAB — BASIC METABOLIC PANEL
ANION GAP: 8 (ref 5–15)
BUN: 13 mg/dL (ref 6–20)
CALCIUM: 9.6 mg/dL (ref 8.9–10.3)
CHLORIDE: 102 mmol/L (ref 101–111)
CO2: 27 mmol/L (ref 22–32)
Creatinine, Ser: 0.96 mg/dL (ref 0.61–1.24)
GFR calc non Af Amer: >60 mL/min (ref >60)
GLUCOSE: 95 mg/dL (ref 65–99)
Potassium: 4 mmol/L (ref 3.5–5.1)
Sodium: 137 mmol/L (ref 135–145)

## 2015-10-10 LAB — CBC WITH DIFFERENTIAL/PLATELET
BASOS PCT: 1 %
Basophils Absolute: 0.1 10*3/uL (ref 0.0–0.1)
Eosinophils Absolute: 0.1 10*3/uL (ref 0.0–0.7)
Eosinophils Relative: 1 %
HEMATOCRIT: 48.3 % (ref 39.0–52.0)
HEMOGLOBIN: 16.7 g/dL (ref 13.0–17.0)
LYMPHS ABS: 2 10*3/uL (ref 0.7–4.0)
LYMPHS PCT: 19 %
MCH: 30.7 pg (ref 26.0–34.0)
MCHC: 34.6 g/dL (ref 30.0–36.0)
MCV: 88.8 fL (ref 78.0–100.0)
MONOS PCT: 11 %
Monocytes Absolute: 1.1 10*3/uL — ABNORMAL HIGH (ref 0.1–1.0)
NEUTROS ABS: 7.4 10*3/uL (ref 1.7–7.7)
NEUTROS PCT: 68 %
Platelets: 347 10*3/uL (ref 150–400)
RBC: 5.44 MIL/uL (ref 4.22–5.81)
RDW: 12.8 % (ref 11.5–15.5)
WBC: 10.7 10*3/uL — ABNORMAL HIGH (ref 4.0–10.5)

## 2015-10-10 NOTE — ED Provider Notes (Signed)
CSN: [id number]     Arrival date & time 10/10/15  1820 History   First MD Initiated Contact with Patient 10/10/15 2149     Chief Complaint  Patient presents with  . possible seizure      (Consider location/radiation/quality/duration/timing/severity/associated sxs/prior Treatment) HPI   Pt is a 25 yo male with PMH of seizures, asthma and intellectual delay who presents to the ED accompanied by his mother with complaint of seizure, onset 6pm. Mother reports the pt was with his mentor earlier today who reported to the pt's mother that the pt had an episode where his nose started running, head leaned forward, lips turned blue and arms started twitching intermittently. Mentor reported the episode lasted for a few minutes and then resolved. Mother reports the pt was then dropped off at her house and she notes he appeared more fatigued tonight. She reports the pt having a hx of seizures and notes he has a variety of seizures ranging from full body convulsions to head leaning forward/twitching. She notes he has been taking his seizure medications as prescribed. Mother denies fever, cough, SOB, vomiting, diarrhea, weakness.   Neurologist Dr. Sharene SkeansHickling.   Level V caveat- intellectual delay, pt nonverbal at baseline  Past Medical History  Diagnosis Date  . Intellectual delay   . Asthma   . Seizures [facility](HCC)    Past Surgical History  Procedure Laterality Date  . Nissen fundoplication  April 2009   No family history on file. Social History  Substance Use Topics  . Smoking status: Never Smoker   . Smokeless tobacco: Never Used  . Alcohol Use: No    Review of Systems  Unable to perform ROS: Patient nonverbal      Allergies  Dilantin and Chloral hydrate  Home Medications   Prior to Admission medications   Medication Sig Start Date End Date Taking? Authorizing Provider  albuterol (PROVENTIL) (5 MG/ML) 0.5% nebulizer solution Take 0.5 mLs (2.5 mg total) by nebulization every 6 (six) hours  as needed for wheezing. 04/16/12   Earley FavorGail Schulz, NP  AMBULATORY NON FORMULARY MEDICATION Clavaris 40mg  daily for acne    Historical Provider, MD  cetirizine (ZYRTEC) 10 MG tablet Take 10 mg by mouth as needed.  05/29/14   Historical Provider, MD  FELBATOL 600 MG tablet Take 1 tab by mouth every morning, 1/2 tab every afternoon and 1 tab every evening 09/23/15   Deetta PerlaWilliam H Hickling, MD  KEPPRA 500 MG tablet Take 1/2 tablet twice per day 09/23/15   Deetta PerlaWilliam H Hickling, MD  LAMICTAL 150 MG tablet Take 1 tab by mouth twice daily. 09/23/15   Deetta PerlaWilliam H Hickling, MD  LAMICTAL 25 MG tablet Take 1 tab by mouth twice daily 09/23/15   Deetta PerlaWilliam H Hickling, MD  predniSONE (DELTASONE) 5 MG tablet Take 1 tab by mouth every morning 09/23/15   Deetta PerlaWilliam H Hickling, MD   BP 124/84 mmHg  Pulse 102  Temp(Src) 98.5 F (36.9 C) (Oral)  Resp 14  Wt 71.215 kg  SpO2 99% Physical Exam  Constitutional: He is oriented to person, place, and time. He appears well-developed and well-nourished. No distress.  Pt non verbal  HENT:  Head: Normocephalic and atraumatic.  Mouth/Throat: Oropharynx is clear and moist. No oropharyngeal exudate.  Eyes: Conjunctivae and EOM are normal. Pupils are equal, round, and reactive to light. Right eye exhibits no discharge. Left eye exhibits no discharge. No scleral icterus.  Neck: Normal range of motion. Neck supple.  Cardiovascular: Normal rate, regular rhythm, normal  heart sounds and intact distal pulses.   HR 96  Pulmonary/Chest: Effort normal and breath sounds normal. No respiratory distress. He has no wheezes. He has no rales. He exhibits no tenderness.  Abdominal: Soft. Bowel sounds are normal. He exhibits no distension and no mass. There is no tenderness. There is no rebound and no guarding.  Musculoskeletal: Normal range of motion. He exhibits no edema.  Lymphadenopathy:    He has no cervical adenopathy.  Neurological: He is alert and oriented to person, place, and time. He has normal  strength. No cranial nerve deficit or sensory deficit. He displays a negative Romberg sign. Coordination normal.  Skin: Skin is warm and dry. He is not diaphoretic.  Nursing note and vitals reviewed.   ED Course  Procedures (including critical care time) Labs Review Labs Reviewed  CBC WITH DIFFERENTIAL/PLATELET - Abnormal; Notable for the following:    WBC 10.7 (*)    Monocytes Absolute 1.1 (*)    All other components within normal limits  BASIC METABOLIC PANEL    Imaging Review No results found. I have personally reviewed and evaluated these images and lab results as part of my medical decision-making.   EKG Interpretation None      MDM   Final diagnoses:  Seizure (HCC)    Pt presents s/p reported seizure. Hx of seizures and intellectual delay, pt nonverbal at baseline. Mother reports pt has been taking his seizure meds as prescribed. Patient with no evidence of focal neuro deficits on physical exam and is at mental baseline.  Labs unremarkable.  Patient is advised to followup with neurologist in regards to today's event.  Mother verbalizes understanding.  Answered all questions.  Patient is hemodynamically stable and in no acute distress prior to discharge.      Satira Sark [place], [place] 10/10/15 2225  Jerelyn Scott, MD 10/10/15 2226

## 2015-10-10 NOTE — ED Notes (Signed)
Mother here with patient who reports that patient was with his mentor this evening and developed nose running and head leaned forward and lips turned blue with some twitching. Patient does have seizure disorder. On arrival patient alert, ambulatory to triage with steady gait. Lips pink, color normal.

## 2015-10-10 NOTE — Discharge Instructions (Signed)
Continue taking your home medications as prescribed. Follow up with your neurologist, Dr. Sharene SkeansHickling, within the next week for follow up and further management of your seizures. Please return to the Emergency Department if symptoms worsen or new onset of fever, change in behavior/mental status, unresponsive, vomiting, difficulty breathing, weakness.

## 2016-03-16 ENCOUNTER — Encounter (HOSPITAL_COMMUNITY): Payer: Self-pay | Admitting: Emergency Medicine

## 2016-03-16 DIAGNOSIS — J45909 Unspecified asthma, uncomplicated: Secondary | ICD-10-CM | POA: Insufficient documentation

## 2016-03-16 DIAGNOSIS — K529 Noninfective gastroenteritis and colitis, unspecified: Secondary | ICD-10-CM | POA: Diagnosis not present

## 2016-03-16 DIAGNOSIS — Z79899 Other long term (current) drug therapy: Secondary | ICD-10-CM | POA: Diagnosis not present

## 2016-03-16 DIAGNOSIS — R109 Unspecified abdominal pain: Secondary | ICD-10-CM | POA: Diagnosis present

## 2016-03-16 LAB — COMPREHENSIVE METABOLIC PANEL
ALBUMIN: 4.1 g/dL (ref 3.5–5.0)
ALK PHOS: 89 U/L (ref 38–126)
ALT: 16 U/L — ABNORMAL LOW (ref 17–63)
ANION GAP: 9 (ref 5–15)
AST: 19 U/L (ref 15–41)
BILIRUBIN TOTAL: 0.4 mg/dL (ref 0.3–1.2)
BUN: 9 mg/dL (ref 6–20)
CALCIUM: 8.9 mg/dL (ref 8.9–10.3)
CO2: 24 mmol/L (ref 22–32)
Chloride: 106 mmol/L (ref 101–111)
Creatinine, Ser: 0.91 mg/dL (ref 0.61–1.24)
GFR calc Af Amer: >60 mL/min (ref >60)
GFR calc non Af Amer: >60 mL/min (ref >60)
GLUCOSE: 111 mg/dL — AB (ref 65–99)
Potassium: 3.6 mmol/L (ref 3.5–5.1)
Sodium: 139 mmol/L (ref 135–145)
TOTAL PROTEIN: 7 g/dL (ref 6.5–8.1)

## 2016-03-16 LAB — CBC
HCT: 44.5 % (ref 39.0–52.0)
HEMOGLOBIN: 15.9 g/dL (ref 13.0–17.0)
MCH: 31.4 pg (ref 26.0–34.0)
MCHC: 35.7 g/dL (ref 30.0–36.0)
MCV: 87.8 fL (ref 78.0–100.0)
Platelets: 381 10*3/uL (ref 150–400)
RBC: 5.07 MIL/uL (ref 4.22–5.81)
RDW: 12.6 % (ref 11.5–15.5)
WBC: 9.3 10*3/uL (ref 4.0–10.5)

## 2016-03-16 LAB — LIPASE, BLOOD: Lipase: 27 U/L (ref 11–51)

## 2016-03-16 NOTE — ED Triage Notes (Signed)
Pt presents to ED for assessment of generalized abdominal pain starting 2 days ago.  Pt has extensive abdominal surgery history.  NAD noted at triage.  Pt has a disability, and is non-verbal.  Mother found pt today c/o pain to stomach, was diaphoretic and mother stated "not looking good".

## 2016-03-17 ENCOUNTER — Encounter (HOSPITAL_COMMUNITY): Payer: Self-pay | Admitting: *Deleted

## 2016-03-17 ENCOUNTER — Emergency Department (HOSPITAL_COMMUNITY)
Admission: EM | Admit: 2016-03-17 | Discharge: 2016-03-17 | Disposition: A | Discharge status: Home / Self Care | Payer: Medicaid Other | Attending: Emergency Medicine | Admitting: Emergency Medicine

## 2016-03-17 ENCOUNTER — Emergency Department (HOSPITAL_COMMUNITY): Payer: Medicaid Other

## 2016-03-17 DIAGNOSIS — K529 Noninfective gastroenteritis and colitis, unspecified: Secondary | ICD-10-CM

## 2016-03-17 LAB — URINALYSIS, ROUTINE W REFLEX MICROSCOPIC
Bilirubin Urine: NEGATIVE
GLUCOSE, UA: NEGATIVE mg/dL
Ketones, ur: NEGATIVE mg/dL
LEUKOCYTES UA: NEGATIVE
Nitrite: NEGATIVE
PH: 6 (ref 5.0–8.0)
Protein, ur: NEGATIVE mg/dL
Specific Gravity, Urine: 1.025 (ref 1.005–1.030)

## 2016-03-17 LAB — URINE MICROSCOPIC-ADD ON

## 2016-03-17 MED ORDER — ONDANSETRON HCL 4 MG/2ML IJ SOLN
4.0000 mg | Freq: Once | INTRAMUSCULAR | Status: AC
  Administered 2016-03-17: 4 mg via INTRAVENOUS
  Filled 2016-03-17: qty 2

## 2016-03-17 MED ORDER — ONDANSETRON 4 MG PO TBDP: 4.0000 mg | ORAL_TABLET | Freq: Three times a day (TID) | ORAL | 0 refills | Status: DC | PRN

## 2016-03-17 MED ORDER — IOPAMIDOL (ISOVUE-300) INJECTION 61%
INTRAVENOUS | Status: AC
  Administered 2016-03-17: 100 mL
  Filled 2016-03-17: qty 100

## 2016-03-17 MED ORDER — GI COCKTAIL ~~LOC~~
30.0000 mL | Freq: Once | ORAL | Status: AC
  Administered 2016-03-17: 30 mL via ORAL
  Filled 2016-03-17: qty 30

## 2016-03-17 MED ORDER — LEVETIRACETAM 250 MG PO TABS
250.0000 mg | ORAL_TABLET | Freq: Once | ORAL | Status: AC
  Administered 2016-03-17: 250 mg via ORAL
  Filled 2016-03-17: qty 1

## 2016-03-17 MED ORDER — FELBAMATE 600 MG/5ML PO SUSP
600.0000 mg | Freq: Once | ORAL | Status: AC
  Administered 2016-03-17: 600 mg via ORAL
  Filled 2016-03-17: qty 5

## 2016-03-17 MED ORDER — LAMOTRIGINE 25 MG PO TABS
175.0000 mg | ORAL_TABLET | Freq: Once | ORAL | Status: AC
  Administered 2016-03-17: 175 mg via ORAL
  Filled 2016-03-17: qty 1

## 2016-03-17 NOTE — Discharge Instructions (Signed)
Please follow-up with your doctor in the next 1-2 days for recheck. Your child develops diarrhea, you may use Imodium over-the-counter. You may alternate Tylenol and ibuprofen as needed for fever and pain. If your child develops worsening pain, vomiting that does not stop, blood in his stool, any symptoms concerning to you, please return to the hospital.

## 2016-03-17 NOTE — ED Provider Notes (Signed)
TIME SEEN:  By signing my name below, I, Arianna Nassar, attest that this documentation has been prepared under the direction and in the presence of Enbridge EnergyKristen N Edi Gorniak, DO.  Electronically Signed: Octavia HeirArianna Nassar, ED Scribe. 03/17/16. 4:30 AM.   CHIEF COMPLAINT:  Chief Complaint  Patient presents with  . Abdominal Pain   *history is provided by mother. Level 5 caveat for cognitive delay*  HPI:  HPI Comments: [name] is a 25 y.o. male who has a PMhx of intellectual delay and seizures presents to the Emergency Department by his mother complaining of constant, gradual worsening, moderate central abdominal pain x 2 days. Pt is mostly non-verbal at baseline. Pt vaguely describes his pain as burning. Associated loss of appetite. Mother notes pt has been complaining of similar symptoms in the past and was diagnosed with GERD. She says she found him in his room this evening very diaphoretic and she notes he did not "look good". She denies diarrhea, nausea, or vomiting.  States he has had a Nissens fundoplication in the past because "his esophagus was to big and he couldn't stop vomiting". She denies any known fever.   ROS:  Level V caveat for cognitive delay   PAST MEDICAL HISTORY/PAST SURGICAL HISTORY:  Past Medical History:  Diagnosis Date  . Asthma   . Intellectual delay   . Seizures (HCC)     MEDICATIONS:  Prior to Admission medications   Medication Sig Start Date End Date Taking? Authorizing Provider  albuterol (PROVENTIL) (5 MG/ML) 0.5% nebulizer solution Take 0.5 mLs (2.5 mg total) by nebulization every 6 (six) hours as needed for wheezing. 04/16/12   Earley FavorGail Schulz, NP  AMBULATORY NON FORMULARY MEDICATION Clavaris 40mg  daily for acne    Historical Provider, MD  cetirizine (ZYRTEC) 10 MG tablet Take 10 mg by mouth as needed.  05/29/14   Historical Provider, MD  FELBATOL 600 MG tablet Take 1 tab by mouth every morning, 1/2 tab every afternoon and 1 tab every evening 09/23/15   Deetta PerlaWilliam H  Hickling, MD  KEPPRA 500 MG tablet Take 1/2 tablet twice per day 09/23/15   Deetta PerlaWilliam H Hickling, MD  LAMICTAL 150 MG tablet Take 1 tab by mouth twice daily. 09/23/15   Deetta PerlaWilliam H Hickling, MD  LAMICTAL 25 MG tablet Take 1 tab by mouth twice daily 09/23/15   Deetta PerlaWilliam H Hickling, MD  predniSONE (DELTASONE) 5 MG tablet Take 1 tab by mouth every morning 09/23/15   Deetta PerlaWilliam H Hickling, MD    ALLERGIES:  Allergies  Allergen Reactions  . Dilantin [Phenytoin Sodium Extended] Other (See Comments)    hallucinations  . Chloral Hydrate Hives and Rash    SOCIAL HISTORY:  Social History  Substance Use Topics  . Smoking status: Never Smoker  . Smokeless tobacco: Never Used  . Alcohol use No    FAMILY HISTORY: History reviewed. No pertinent family history.  EXAM: BP 130/69 (BP Location: Right Arm)   Pulse 68   Temp 98 F (36.7 C) (Oral)   Resp 19   Ht 5\' 7"  (1.702 m)   Wt 160 lb (72.6 kg)   SpO2 99%   BMI 25.06 kg/m  CONSTITUTIONAL: Alert And will nod his head to some questions but is mostly nonverbal, nontoxic appearing, afebrile, well-nourished, well-hydrated HEAD: Normocephalic EYES: Conjunctivae clear, PERRL ENT: normal nose; no rhinorrhea; moist mucous membranes NECK: Supple, no meningismus, no LAD  CARD: RRR; S1 and S2 appreciated; no murmurs, no clicks, no rubs, no gallops RESP: Normal chest excursion  without splinting or tachypnea; breath sounds clear and equal bilaterally; no wheezes, no rhonchi, no rales, no hypoxia or respiratory distress, speaking full sentences ABD/GI: Normal bowel sounds; non-distended; soft, mildly tender to palpation diffusely throughout the abdomen, no significant tenderness at McBurney's point, negative Murphy sign, no rebound, no guarding, no peritoneal signs BACK:  The back appears normal and is non-tender to palpation, there is no CVA tenderness EXT: Normal ROM in all joints; non-tender to palpation; no edema; normal capillary refill; no cyanosis, no calf  tenderness or swelling    SKIN: Normal color for age and race; warm; no rash NEURO: Moves all extremities equally  MEDICAL DECISION MAKING: Patient here with abdominal pain. History and exam limited as patient is mostly nonverbal at baseline. Labs obtained in triage are unremarkable. He is mildly tender to palpation diffusely. Discussed with mother that this could be gastritis, GERD. Will treat with Zofran, GI cocktail. Given I am not able to obtain reliable history or exam, I feel he will need imaging for further evaluation of his pain. We'll obtain CT scan.  ED PROGRESS: Patient reports feeling better after GI cocktail, Zofran and fluids. Repeat abdominal exam completely benign.  CT scan shows small amount of extraluminal fluid in the lateral left mid abdomen with a single loop of mildly dilated jejunum likely from localized enteritis. No free air, bowel obstruction. Appendix is normal. Normal-appearing gallbladder, pancreas, kidneys. I feel he is safe to be discharged home. Suspect viral illness. Will discharge with Zofran to take as needed. Discussed with mother she should alternate Tylenol and Motrin for pain. Discussed with her knee may develop diarrhea and she can use Imodium over-the-counter. I have recommended close follow-up with their doctor in the next 1-2 days. I have given her copy of these results to take to her PCP. Have discussed with her at length return precautions. She is comfortable with this plan.  At this time, I do not feel there is any life-threatening condition present. I have reviewed and discussed all results (EKG, imaging, lab, urine as appropriate), exam findings with patient/family. I have reviewed nursing notes and appropriate previous records.  I feel the patient is safe to be discharged home without further emergent workup and can continue workup as an outpatient as needed. Discussed usual and customary return precautions. Patient/family verbalize understanding and are  comfortable with this plan.  Outpatient follow-up has been provided. All questions have been answered.    I personally performed the services described in this documentation, which was scribed in my presence. The recorded information has been reviewed and is accurate.    Layla MawKristen N Binta Statzer, DO 03/17/16 [phone]

## 2016-03-17 NOTE — ED Notes (Signed)
Patient transported to CT 

## 2016-04-12 ENCOUNTER — Emergency Department (HOSPITAL_COMMUNITY)
Admission: EM | Admit: 2016-04-12 | Discharge: 2016-04-13 | Disposition: A | Discharge status: Home / Self Care | Payer: Medicaid Other | Attending: Emergency Medicine | Admitting: Emergency Medicine

## 2016-04-12 ENCOUNTER — Encounter (HOSPITAL_COMMUNITY): Payer: Self-pay | Admitting: Emergency Medicine

## 2016-04-12 DIAGNOSIS — R109 Unspecified abdominal pain: Secondary | ICD-10-CM | POA: Diagnosis present

## 2016-04-12 DIAGNOSIS — J45909 Unspecified asthma, uncomplicated: Secondary | ICD-10-CM | POA: Insufficient documentation

## 2016-04-12 DIAGNOSIS — R1013 Epigastric pain: Secondary | ICD-10-CM | POA: Diagnosis not present

## 2016-04-12 NOTE — ED Triage Notes (Signed)
Pt presents with [facility] for increasing abd pain; pt noted to be rubbing RUQ; EMS reports upper abd firm and warm to the touch; pts family states pt has hx of MR and that he has not had a BM in 2 days; family also reports that pt has hx of unspecified abd surgery and possible enlarged intestine, was told to take miralax; VSS enroute per EMS report

## 2016-04-13 ENCOUNTER — Emergency Department (HOSPITAL_COMMUNITY): Payer: Medicaid Other

## 2016-04-13 LAB — CBC
HCT: 46.9 % (ref 39.0–52.0)
Hemoglobin: 17.4 g/dL — ABNORMAL HIGH (ref 13.0–17.0)
MCH: 31.9 pg (ref 26.0–34.0)
MCHC: 36.6 g/dL — AB (ref 30.0–36.0)
MCV: 85.6 fL (ref 78.0–100.0)
PLATELETS: 312 10*3/uL (ref 150–400)
RBC: 5.48 MIL/uL (ref 4.22–5.81)
RDW: 12.6 % (ref 11.5–15.5)
WBC: 10.5 10*3/uL (ref 4.0–10.5)

## 2016-04-13 LAB — URINALYSIS, ROUTINE W REFLEX MICROSCOPIC
BILIRUBIN URINE: NEGATIVE
Glucose, UA: NEGATIVE mg/dL
Ketones, ur: NEGATIVE mg/dL
Leukocytes, UA: NEGATIVE
Nitrite: NEGATIVE
PROTEIN: 30 mg/dL — AB
Specific Gravity, Urine: >1.046 — ABNORMAL HIGH (ref 1.005–1.030)
pH: 6 (ref 5.0–8.0)

## 2016-04-13 LAB — COMPREHENSIVE METABOLIC PANEL
ALK PHOS: 89 U/L (ref 38–126)
ALT: 18 U/L (ref 17–63)
AST: 25 U/L (ref 15–41)
Albumin: 4.6 g/dL (ref 3.5–5.0)
Anion gap: 12 (ref 5–15)
BUN: 8 mg/dL (ref 6–20)
CALCIUM: 9.4 mg/dL (ref 8.9–10.3)
CO2: 26 mmol/L (ref 22–32)
CREATININE: 0.92 mg/dL (ref 0.61–1.24)
Chloride: 100 mmol/L — ABNORMAL LOW (ref 101–111)
GFR calc non Af Amer: >60 mL/min (ref >60)
GLUCOSE: 110 mg/dL — AB (ref 65–99)
Potassium: 3.7 mmol/L (ref 3.5–5.1)
SODIUM: 138 mmol/L (ref 135–145)
Total Bilirubin: 0.4 mg/dL (ref 0.3–1.2)
Total Protein: 7.5 g/dL (ref 6.5–8.1)

## 2016-04-13 LAB — URINE MICROSCOPIC-ADD ON

## 2016-04-13 LAB — LIPASE, BLOOD: Lipase: 16 U/L (ref 11–51)

## 2016-04-13 LAB — RAPID STREP SCREEN (MED CTR MEBANE ONLY): STREPTOCOCCUS, GROUP A SCREEN (DIRECT): NEGATIVE

## 2016-04-13 MED ORDER — ONDANSETRON HCL 4 MG/2ML IJ SOLN
4.0000 mg | Freq: Once | INTRAMUSCULAR | Status: AC
  Administered 2016-04-13: 4 mg via INTRAVENOUS
  Filled 2016-04-13: qty 2

## 2016-04-13 MED ORDER — SODIUM CHLORIDE 0.9 % IV BOLUS (SEPSIS): 1000.0000 mL | Freq: Once | INTRAVENOUS | Status: DC

## 2016-04-13 MED ORDER — SODIUM CHLORIDE 0.9 % IV BOLUS (SEPSIS)
1000.0000 mL | Freq: Once | INTRAVENOUS | Status: AC
  Administered 2016-04-13: 1000 mL via INTRAVENOUS

## 2016-04-13 MED ORDER — FAMOTIDINE 20 MG PO TABS: 20.0000 mg | ORAL_TABLET | Freq: Two times a day (BID) | ORAL | 0 refills | Status: DC

## 2016-04-13 MED ORDER — GI COCKTAIL ~~LOC~~
30.0000 mL | Freq: Once | ORAL | Status: AC
  Administered 2016-04-13: 30 mL via ORAL
  Filled 2016-04-13: qty 30

## 2016-04-13 MED ORDER — MORPHINE SULFATE (PF) 4 MG/ML IV SOLN
2.0000 mg | Freq: Once | INTRAVENOUS | Status: AC
  Administered 2016-04-13: 2 mg via INTRAVENOUS
  Filled 2016-04-13: qty 1

## 2016-04-13 MED ORDER — IOPAMIDOL (ISOVUE-300) INJECTION 61%
INTRAVENOUS | Status: AC
  Administered 2016-04-13: 100 mL
  Filled 2016-04-13: qty 100

## 2016-04-13 NOTE — ED Provider Notes (Signed)
[facility] DEPT Provider Note   CSN: [id number] Arrival date & time: 04/12/16  2327     History   Chief Complaint Chief Complaint  Patient presents with  . Abdominal Pain    HPI [name] is a 25 y.o. male.  HPI [name] is a 25 y.o. male with history of MR, seizures, asthma, presents to emergency department complaining of abdominal pain. Patient's abdominal pain started suddenly, this evening according to his mother. His mother found him laying across his bed, diaphoretic, moaning in pain. Patient was pointing over periumbilical area, stating that he was hurting. Patient had similar episode a month ago for which she was seen in emergency department and was diagnosed with enteritis. Mother states the patient has been more comfortable since then until this evening. He has not had any episodes of emesis. She is unsure if his bowel movements but patient states that he has had regular bowel movements. Pt has also mentioned he has had some sore throat. No cough. No tx prior to coming in. Has been eating and drinking well.   Past Medical History:  Diagnosis Date  . Asthma   . Intellectual delay   . Seizures [facility])     Patient Active Problem List   Diagnosis Date Noted  . Acne 08/02/2013  . 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 Abnormality of gait 11/17/2012  . Moderate intellectual disabilities 11/17/2012  . Developmental delay disorder 11/17/2012    Past Surgical History:  Procedure Laterality Date  . NISSEN FUNDOPLICATION  April 2009       Home Medications    Prior to Admission medications   Medication Sig Start Date End Date Taking? Authorizing Provider  albuterol (PROVENTIL) (5 MG/ML) 0.5% nebulizer solution Take 0.5 mLs (2.5 mg total) by nebulization every 6 (six) hours as needed for wheezing. 04/16/12  Yes Earley Favor, NP  FELBATOL 600 MG tablet Take 1 tab by mouth every morning, 1/2 tab  every afternoon and 1 tab every evening Patient taking differently: Take 300-600 mg by mouth See admin instructions. Take 1 tab by mouth every morning, 1/2 tab every afternoon and 1 tab every evening 09/23/15  Yes Deetta Perla, MD  KEPPRA 500 MG tablet Take 1/2 tablet twice per day Patient taking differently: Take 250 mg by mouth 2 (two) times daily.  09/23/15  Yes Deetta Perla, MD  LAMICTAL 150 MG tablet Take 1 tab by mouth twice daily. Patient taking differently: Take 150 mg by mouth 2 (two) times daily. Take with lamictal 25 to equal 175 mg 09/23/15  Yes Deetta Perla, MD  LAMICTAL 25 MG tablet Take 1 tab by mouth twice daily Patient taking differently: Take 25 mg by mouth 2 (two) times daily. Take with lamictal 150 mg to equal 175 mg 09/23/15  Yes Deetta Perla, MD  ondansetron (ZOFRAN ODT) 4 MG disintegrating tablet Take 1 tablet (4 mg total) by mouth every 8 (eight) hours as needed for nausea or vomiting. 03/17/16  Yes Kristen N Ward, DO  predniSONE (DELTASONE) 5 MG tablet Take 5 mg by mouth daily. 03/19/16  Yes Historical Provider, MD    Family History History reviewed. No pertinent family history.  Social History Social History  Substance Use Topics  . Smoking status: Never Smoker  . Smokeless tobacco: Never Used  . Alcohol use No     Allergies   Dilantin [phenytoin sodium extended] and Chloral hydrate   Review of Systems  Review of Systems  Constitutional: Negative for chills and fever.  HENT: Positive for sore throat. Negative for trouble swallowing and voice change.   Respiratory: Negative for cough, chest tightness and shortness of breath.   Cardiovascular: Negative for chest pain, palpitations and leg swelling.  Gastrointestinal: Positive for abdominal pain. Negative for abdominal distention, diarrhea, nausea and vomiting.  Genitourinary: Negative for dysuria, frequency, hematuria and urgency.  Musculoskeletal: Negative for arthralgias, myalgias, neck  pain and neck stiffness.  Skin: Negative for rash.  Allergic/Immunologic: Negative for immunocompromised state.  Neurological: Negative for dizziness, weakness, light-headedness, numbness and headaches.  All other systems reviewed and are negative.    Physical Exam Updated Vital Signs BP 130/75   Pulse 88   Temp 99.2 F (37.3 C) (Oral)   Resp 16   Ht 5\' 6"  (1.676 m)   Wt 72.6 kg   SpO2 97%   BMI 25.82 kg/m   Physical Exam  Constitutional: He appears well-developed and well-nourished. No distress.  HENT:  Head: Normocephalic and atraumatic.  Eyes: Conjunctivae are normal.  Neck: Neck supple.  Cardiovascular: Normal rate, regular rhythm and normal heart sounds.   Pulmonary/Chest: Effort normal. No respiratory distress. He has no wheezes. He has no rales.  Abdominal: Soft. Bowel sounds are normal. He exhibits no distension. There is tenderness. There is no rebound.  Diffuse tenderness  Musculoskeletal: He exhibits no edema.  Neurological: He is alert.  Skin: Skin is warm and dry.  Nursing note and vitals reviewed.    ED Treatments / Results  Labs (all labs ordered are listed, but only abnormal results are displayed) Labs Reviewed  CBC - Abnormal; Notable for the following:       Result Value   Hemoglobin 17.4 (*)    MCHC 36.6 (*)    All other components within normal limits  RAPID STREP SCREEN (NOT AT [facility])  LIPASE, BLOOD  COMPREHENSIVE METABOLIC PANEL  URINALYSIS, ROUTINE W REFLEX MICROSCOPIC ([facility])    EKG  EKG Interpretation None       Radiology Ct Abdomen Pelvis W Contrast  Result Date: 04/13/2016 CLINICAL DATA:  Abdominal pain EXAM: CT ABDOMEN AND PELVIS WITH CONTRAST TECHNIQUE: Multidetector CT imaging of the abdomen and pelvis was performed using the standard protocol following bolus administration of intravenous contrast. CONTRAST:  100ml ISOVUE-300 IOPAMIDOL (ISOVUE-300) INJECTION 61% COMPARISON:  03/17/2016 FINDINGS: Lower chest: No acute  abnormality. Hepatobiliary: No focal liver abnormality is seen. No gallstones, gallbladder wall thickening, or biliary dilatation. Pancreas: Unremarkable. No pancreatic ductal dilatation or surrounding inflammatory changes. Spleen: Normal in size without focal abnormality. Adrenals/Urinary Tract: Adrenal glands are unremarkable. Kidneys are normal, without renal calculi, focal lesion, or hydronephrosis. Bladder is unremarkable. Stomach/Bowel: Stomach is within normal limits. Appendix appears normal. No evidence of bowel wall thickening, distention, or inflammatory changes. Vascular/Lymphatic: No significant vascular findings are present. No enlarged abdominal or pelvic lymph nodes. Reproductive: Prostate is unremarkable. Other: No abdominal wall hernia or abnormality. No abdominopelvic ascites. Musculoskeletal: No acute or significant osseous findings. IMPRESSION: No significant abnormality Electronically Signed   By: Ellery Plunkaniel R Mitchell M.D.   On: 04/13/2016 02:33   Dg Abd Acute W/chest  Result Date: 04/13/2016 CLINICAL DATA:  Acute onset of mid abdominal pain. Initial encounter. EXAM: DG ABDOMEN ACUTE W/ 1V CHEST COMPARISON:  Chest radiograph performed 03/10/2009 and CT of the abdomen and pelvis performed 03/17/2016 FINDINGS: The lungs are well-aerated and clear. There is no evidence of focal opacification, pleural effusion or pneumothorax. The cardiomediastinal silhouette  is within normal limits. The visualized bowel gas pattern is unremarkable. Scattered stool and air are seen within the colon; there is no evidence of small bowel dilatation to suggest obstruction. No free intra-abdominal air is identified on the provided upright view. Clips are noted about the gastroesophageal junction. No acute osseous abnormalities are seen; the sacroiliac joints are unremarkable in appearance. IMPRESSION: 1. Unremarkable bowel gas pattern; no free intra-abdominal air seen. Small amount of stool noted in the colon. 2. No  acute cardiopulmonary process seen. Electronically Signed   By: Roanna RaiderJeffery  Chang M.D.   On: 04/13/2016 01:07    Procedures Procedures (including critical care time)  Medications Ordered in ED Medications - No data to display   Initial Impression / Assessment and Plan / ED Course  I have reviewed the triage vital signs and the nursing notes.  Pertinent labs & imaging results that were available during my care of the patient were reviewed by me and considered in my medical decision making (see chart for details).  Clinical Course    Pt is a poor historian but able to answer most of questions, not sure how accurately. Pt with diffuse abdominal pain and tenderness. Pt in NAD. Will get labs, abd film, ua.    Labs unremarkable. UA pending. CT normal. Pt resting comfortably. Abdomen is soft. Will give more fluids and obtain UA.    3:52 AM UA concentrated, otherwise unremarkable. Again abdomen reassessed. Soft. Will dc home with outpatient follow up. Will start on pepcid. Tylenol for pain. Return precautions discussed with mother.   Vitals:   04/13/16 0200 04/13/16 0230 04/13/16 0300 04/13/16 0330  BP: 115/61 115/70 122/90 119/70  Pulse: 71 87 93 91  Resp: 16     Temp:      TempSrc:      SpO2: 97% 99% 99% 97%  Weight:      Height:         Final Clinical Impressions(s) / ED Diagnoses   Final diagnoses:  Epigastric pain    New Prescriptions New Prescriptions   FAMOTIDINE (PEPCID) 20 MG TABLET    Take 1 tablet (20 mg total) by mouth 2 (two) times daily.     Jaynie Crumbleatyana Vivian Okelley, PA-C 04/13/16 [id number]    Shon Batonourtney F Horton, MD 04/13/16 [phone]

## 2016-04-13 NOTE — ED Notes (Signed)
Patient transported to CT via stretcher.

## 2016-04-13 NOTE — ED Notes (Signed)
Pt verbalized understanding of d/c instructions and has no further questions. Pt is stable, A&Ox4, VSS.  

## 2016-04-13 NOTE — Discharge Instructions (Signed)
Take tylenol for pain. Take pepcid daily to help with pain. Drink plenty of fluids. Follow up with family doctor. Return if worsening.

## 2016-04-15 LAB — CULTURE, GROUP A STREP (THRC)

## 2016-05-08 ENCOUNTER — Emergency Department (HOSPITAL_COMMUNITY): Payer: Medicaid Other

## 2016-05-08 ENCOUNTER — Encounter (HOSPITAL_COMMUNITY): Payer: Self-pay

## 2016-05-08 ENCOUNTER — Emergency Department (HOSPITAL_COMMUNITY)
Admission: EM | Admit: 2016-05-08 | Discharge: 2016-05-08 | Disposition: A | Discharge status: Home / Self Care | Payer: Medicaid Other | Attending: Emergency Medicine | Admitting: Emergency Medicine

## 2016-05-08 DIAGNOSIS — J45909 Unspecified asthma, uncomplicated: Secondary | ICD-10-CM | POA: Insufficient documentation

## 2016-05-08 DIAGNOSIS — R1011 Right upper quadrant pain: Secondary | ICD-10-CM | POA: Diagnosis not present

## 2016-05-08 LAB — COMPREHENSIVE METABOLIC PANEL
ALBUMIN: 4.3 g/dL (ref 3.5–5.0)
ALT: 20 U/L (ref 17–63)
AST: 23 U/L (ref 15–41)
Alkaline Phosphatase: 86 U/L (ref 38–126)
Anion gap: 6 (ref 5–15)
BILIRUBIN TOTAL: 0.9 mg/dL (ref 0.3–1.2)
BUN: 12 mg/dL (ref 6–20)
CALCIUM: 9 mg/dL (ref 8.9–10.3)
CO2: 25 mmol/L (ref 22–32)
CREATININE: 0.81 mg/dL (ref 0.61–1.24)
Chloride: 106 mmol/L (ref 101–111)
GFR calc Af Amer: >60 mL/min (ref >60)
GFR calc non Af Amer: >60 mL/min (ref >60)
GLUCOSE: 120 mg/dL — AB (ref 65–99)
Potassium: 4.2 mmol/L (ref 3.5–5.1)
SODIUM: 137 mmol/L (ref 135–145)
TOTAL PROTEIN: 7.8 g/dL (ref 6.5–8.1)

## 2016-05-08 LAB — CBC
HCT: 49.3 % (ref 39.0–52.0)
Hemoglobin: 18 g/dL — ABNORMAL HIGH (ref 13.0–17.0)
MCH: 31.9 pg (ref 26.0–34.0)
MCHC: 36.5 g/dL — AB (ref 30.0–36.0)
MCV: 87.4 fL (ref 78.0–100.0)
PLATELETS: 335 10*3/uL (ref 150–400)
RBC: 5.64 MIL/uL (ref 4.22–5.81)
RDW: 12.6 % (ref 11.5–15.5)
WBC: 13.4 10*3/uL — ABNORMAL HIGH (ref 4.0–10.5)

## 2016-05-08 LAB — URINALYSIS, ROUTINE W REFLEX MICROSCOPIC
Bacteria, UA: NONE SEEN
Bilirubin Urine: NEGATIVE
GLUCOSE, UA: NEGATIVE mg/dL
Hgb urine dipstick: NEGATIVE
Ketones, ur: NEGATIVE mg/dL
Leukocytes, UA: NEGATIVE
NITRITE: NEGATIVE
PH: 7 (ref 5.0–8.0)
PROTEIN: 30 mg/dL — AB
SPECIFIC GRAVITY, URINE: 1.028 (ref 1.005–1.030)
Squamous Epithelial / LPF: NONE SEEN

## 2016-05-08 LAB — LIPASE, BLOOD: Lipase: 17 U/L (ref 11–51)

## 2016-05-08 MED ORDER — GI COCKTAIL ~~LOC~~
30.0000 mL | Freq: Once | ORAL | Status: AC
  Administered 2016-05-08: 30 mL via ORAL
  Filled 2016-05-08: qty 30

## 2016-05-08 MED ORDER — ACETAMINOPHEN 500 MG PO TABS
1000.0000 mg | ORAL_TABLET | Freq: Once | ORAL | Status: AC
  Administered 2016-05-08: 1000 mg via ORAL
  Filled 2016-05-08: qty 2

## 2016-05-08 MED ORDER — ONDANSETRON 4 MG PO TBDP
4.0000 mg | ORAL_TABLET | Freq: Once | ORAL | Status: AC
  Administered 2016-05-08: 4 mg via ORAL
  Filled 2016-05-08: qty 1

## 2016-05-08 NOTE — ED Triage Notes (Signed)
Pt presents with mother, reports right side flank pain. Pt also reported to his caregiver that he felt nauseous. Pt had surgery for acid reflux per his mother.

## 2016-05-08 NOTE — ED Notes (Signed)
Family updated.

## 2016-05-08 NOTE — ED Notes (Addendum)
Per pt's mother, pt has history of intellectual delay. Pt able to understand simple questions and commands and reply verbally with short answers.

## 2016-05-08 NOTE — ED Notes (Signed)
Pt given coca cola and saltine crackers and given chicken wing by mother. Pt able to eat without difficulty and denies N/V.

## 2016-05-08 NOTE — ED Notes (Signed)
Pt. returned from XR. 

## 2016-05-08 NOTE — ED Provider Notes (Signed)
[facility] Provider Note   CSN: [id number] Arrival date & time: 05/08/16  1230     History   Chief Complaint Chief Complaint  Patient presents with  . Flank Pain  . Nausea    HPI   Blood pressure 126/69, pulse 88, temperature 98.2 F (36.8 C), temperature source Oral, resp. rate 16, SpO2 98 %.  [name] is a 25 y.o. male with past medical history significant for MR, asthma, seizure disorder. History supplied by mother who reports that his caregiver reported he was not himself today, eyes were glossy he was pale, he was gripping his right upper quadrant stating he was in severe pain. He had nausea with no emesis. Is having normal bowel movements with no change in urination, fever, chills, decreased by mouth intake. States pain is severe.  Past Medical History:  Diagnosis Date  . Asthma   . Intellectual delay   . Seizures [facility])     Patient Active Problem List   Diagnosis Date Noted  . Acne 08/02/2013  . 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 Abnormality of gait 11/17/2012  . Moderate intellectual disabilities 11/17/2012  . Developmental delay disorder 11/17/2012    Past Surgical History:  Procedure Laterality Date  . NISSEN FUNDOPLICATION  April 2009       Home Medications    Prior to Admission medications   Medication Sig Start Date End Date Taking? Authorizing Provider  albuterol (PROVENTIL) (5 MG/ML) 0.5% nebulizer solution Take 0.5 mLs (2.5 mg total) by nebulization every 6 (six) hours as needed for wheezing. 04/16/12   Earley Favor, NP  famotidine (PEPCID) 20 MG tablet Take 1 tablet (20 mg total) by mouth 2 (two) times daily. 04/13/16   Tatyana Kirichenko, PA-C  FELBATOL 600 MG tablet Take 1 tab by mouth every morning, 1/2 tab every afternoon and 1 tab every evening Patient taking differently: Take 300-600 mg by mouth See admin instructions. Take 1 tab by mouth every morning, 1/2  tab every afternoon and 1 tab every evening 09/23/15   Deetta Perla, MD  KEPPRA 500 MG tablet Take 1/2 tablet twice per day Patient taking differently: Take 250 mg by mouth 2 (two) times daily.  09/23/15   Deetta Perla, MD  LAMICTAL 150 MG tablet Take 1 tab by mouth twice daily. Patient taking differently: Take 150 mg by mouth 2 (two) times daily. Take with lamictal 25 to equal 175 mg 09/23/15   Deetta Perla, MD  LAMICTAL 25 MG tablet Take 1 tab by mouth twice daily Patient taking differently: Take 25 mg by mouth 2 (two) times daily. Take with lamictal 150 mg to equal 175 mg 09/23/15   Deetta Perla, MD  ondansetron (ZOFRAN ODT) 4 MG disintegrating tablet Take 1 tablet (4 mg total) by mouth every 8 (eight) hours as needed for nausea or vomiting. 03/17/16   Kristen N Ward, DO  predniSONE (DELTASONE) 5 MG tablet Take 5 mg by mouth daily. 03/19/16   Historical Provider, MD    Family History History reviewed. No pertinent family history.  Social History Social History  Substance Use Topics  . Smoking status: Never Smoker  . Smokeless tobacco: Never Used  . Alcohol use No     Allergies   Dilantin [phenytoin sodium extended] and Chloral hydrate   Review of Systems Review of Systems  10 systems reviewed and found to be negative, except as noted in the HPI.   Physical  Exam Updated Vital Signs BP 124/75   Pulse 80   Temp 98.2 F (36.8 C) (Oral)   Resp 16   SpO2 97%   Physical Exam  Constitutional: He is oriented to person, place, and time. He appears well-developed and well-nourished. No distress.  HENT:  Head: Normocephalic and atraumatic.  Mouth/Throat: Oropharynx is clear and moist.  Eyes: Conjunctivae and EOM are normal. Pupils are equal, round, and reactive to light.  Neck: Normal range of motion.  Cardiovascular: Normal rate, regular rhythm and intact distal pulses.   Pulmonary/Chest: Effort normal and breath sounds normal.  Abdominal: Soft. There is  no tenderness.  Holding right upper quadrant. No tenderness to deep palpation of any quadrant, normoactive bowel sounds. Negative CVA tenderness to percussion bilaterally.  Genitourinary:  Genitourinary Comments: GU exam a chaperoned by my mother. Patient is circumcised, no testicular pain or swelling.  Musculoskeletal: Normal range of motion.  Neurological: He is alert and oriented to person, place, and time.  Skin: He is not diaphoretic.  Psychiatric: He has a normal mood and affect.  Nursing note and vitals reviewed.    ED Treatments / Results  Labs (all labs ordered are listed, but only abnormal results are displayed) Labs Reviewed  COMPREHENSIVE METABOLIC PANEL - Abnormal; Notable for the following:       Result Value   Glucose, Bld 120 (*)    All other components within normal limits  CBC - Abnormal; Notable for the following:    WBC 13.4 (*)    Hemoglobin 18.0 (*)    MCHC 36.5 (*)    All other components within normal limits  URINALYSIS, ROUTINE W REFLEX MICROSCOPIC - Abnormal; Notable for the following:    Protein, ur 30 (*)    All other components within normal limits  LIPASE, BLOOD    EKG  EKG Interpretation None       Radiology Dg Abdomen Acute W/chest  Result Date: 05/08/2016 CLINICAL DATA:  RIGHT-sided flank pain EXAM: DG ABDOMEN ACUTE W/ 1V CHEST COMPARISON:  CT 04/13/2016 FINDINGS: Normal mediastinum and cardiac silhouette. Normal pulmonary vasculature. No evidence of effusion, infiltrate, or pneumothorax. No acute bony abnormality. No dilated large or small bowel. Gas throughout the small bowel and colon including rectum. Multiple surgical clips in the gastric cardiac region consistent Nissen fundoplication. IMPRESSION: 1.  No acute cardiopulmonary process. 2. No bowel abnormality. Electronically Signed   By: Genevive BiStewart  Edmunds M.D.   On: 05/08/2016 19:13    Procedures Procedures (including critical care time)  Medications Ordered in ED Medications    acetaminophen (TYLENOL) tablet 1,000 mg (1,000 mg Oral Given 05/08/16 1757)  gi cocktail (Maalox,Lidocaine,Donnatal) (30 mLs Oral Given 05/08/16 1757)  ondansetron (ZOFRAN-ODT) disintegrating tablet 4 mg (4 mg Oral Given 05/08/16 1918)     Initial Impression / Assessment and Plan / ED Course  I have reviewed the triage vital signs and the nursing notes.  Pertinent labs & imaging results that were available during my care of the patient were reviewed by me and considered in my medical decision making (see chart for details).  Clinical Course     Vitals:   05/08/16 1915 05/08/16 1930 05/08/16 1945 05/08/16 2000  BP: 116/68 130/83 118/60 124/75  Pulse: 91 71 73 80  Resp:      Temp:      TempSrc:      SpO2: 100% 98% 100% 97%    Medications  acetaminophen (TYLENOL) tablet 1,000 mg (1,000 mg Oral Given 05/08/16 1757)  gi cocktail (Maalox,Lidocaine,Donnatal) (30 mLs Oral Given 05/08/16 1757)  ondansetron (ZOFRAN-ODT) disintegrating tablet 4 mg (4 mg Oral Given 05/08/16 1918)    [name] is 25 y.o. male presenting with Right upper quadrant pain and nausea with no vomiting. Abdominal exam benign with no tenderness to deep palpation of any quadrant, normoactive bowel sounds. Blood work reassuring. Patient is eating voraciously, acute abdominal series negative. He had a recent CT I don't think it would be in his best interest to repeat the imaging. Urinalysis with no hematuria or signs of infection. GU exam is negative with no testicular pain or swelling. Repeat abdominal exam is benign, patient given GI referral.  Evaluation does not show pathology that would require ongoing emergent intervention or inpatient treatment. Pt is hemodynamically stable and mentating appropriately. Discussed findings and plan with patient/guardian, who agrees with care plan. All questions answered. Return precautions discussed and outpatient follow up given.    Final Clinical Impressions(s) / ED Diagnoses    Final diagnoses:  Right upper quadrant abdominal pain    New Prescriptions New Prescriptions   No medications on file     Wynetta Emeryicole Keyatta Tolles, PA-C 05/08/16 2021    Shaune Pollackameron Isaacs, MD 05/10/16 1012

## 2016-05-08 NOTE — Discharge Instructions (Signed)
Take acetaminophen (Tylenol) up to 650 mg (this is normally 2 over-the-counter pills) up to every 4-6 hours as needed for pain control. Make sure your other medications do not contain acetaminophen (Read the labels!) ° °Please follow with your primary care doctor in the next 2 days for a check-up. They must obtain records for further management.  ° °Do not hesitate to return to the Emergency Department for any new, worsening or concerning symptoms.  ° °

## 2016-05-08 NOTE — ED Notes (Signed)
Pt transported to XR.  

## 2016-05-08 NOTE — ED Notes (Signed)
Family updated on plan of care  

## 2016-05-08 NOTE — ED Notes (Signed)
EDP at bedside  

## 2016-05-14 ENCOUNTER — Encounter: Payer: Self-pay | Admitting: Gastroenterology

## 2016-05-25 ENCOUNTER — Encounter: Payer: Self-pay | Admitting: Gastroenterology

## 2016-05-25 ENCOUNTER — Encounter (INDEPENDENT_AMBULATORY_CARE_PROVIDER_SITE_OTHER): Payer: Self-pay

## 2016-05-25 ENCOUNTER — Ambulatory Visit (INDEPENDENT_AMBULATORY_CARE_PROVIDER_SITE_OTHER): Payer: Medicaid Other | Admitting: Gastroenterology

## 2016-05-25 VITALS — BP 98/68 | HR 72 | Ht 66.5 in | Wt 160.2 lb

## 2016-05-25 DIAGNOSIS — R1084 Generalized abdominal pain: Secondary | ICD-10-CM

## 2016-05-25 DIAGNOSIS — K5900 Constipation, unspecified: Secondary | ICD-10-CM | POA: Diagnosis not present

## 2016-05-25 MED ORDER — POLYETHYLENE GLYCOL 3350 17 GM/SCOOP PO POWD: ORAL | 3 refills | Status: DC

## 2016-05-25 NOTE — Progress Notes (Signed)
05/25/2016 [name] [id number] Sep 21, 1990   HISTORY OF PRESENT ILLNESS:  This is a 26 year old male who is new to our practice and was told to follow-up here from recent ER visits. He is here today with his mother who tells me his story and answers all of the questions. The patient has intellectual delay and history of seizures. Does not speak much during his visit. He apparently has a history of a Nissen fundoplication when he was 26 years old. The story that his mother gave me regarding his symptoms at that time did not really sound reflux related. This was performed by pediatric surgeon, Dr. Leeanne MannanFarooqui.  There is a biopsy report from 07/2003 that showed EG junction with features c/w mild reflux.  Anyway, he was recently in the ER on 2 occasions, once in November and once in December with complaints of abdominal pain. His mother says that he cannot really relay a history or his symptoms well. She said that all he was saying was that he was having abdominal pain and looked pale and sweaty. CT scan of the abdomen and pelvis with contrast on November 7 showed a small amount of extraluminal fluid in the left lateral mid abdomen with a single loop of mildly dilated jejunum in that area of noted, question localized enteritis.  Then, repeat CT scan on December 4 was normal and showed that those issues have resolved.  Currently he is not having pain and has been feeling well again for the past couple of weeks. He apparently does have some constipation on and off. In association with this constipation he has seen small amounts of bright blood on the toilet paper on occasion.  Labs including CBC, CMP, and lipase have all been unremarkable except for a WBC count of 13.4 on the last ER visit.   Past Medical History:  Diagnosis Date  . Asthma   . Intellectual delay   . Seizures (HCC)    Past Surgical History:  Procedure Laterality Date  . NISSEN FUNDOPLICATION  April 2009    reports that he has  never smoked. He has never used smokeless tobacco. He reports that he does not drink alcohol or use drugs. family history includes Asthma in his mother; Diabetes in his maternal grandmother; GER disease in his mother; Heart disease in his maternal grandmother; Leukemia in his maternal grandmother; Thyroid disease in his mother and sister. Allergies  Allergen Reactions  . Dilantin [Phenytoin Sodium Extended] Other (See Comments)    hallucinations  . Chloral Hydrate Hives and Rash      Outpatient Encounter Prescriptions as of 05/25/2016  Medication Sig  . albuterol (PROVENTIL) (5 MG/ML) 0.5% nebulizer solution Take 0.5 mLs (2.5 mg total) by nebulization every 6 (six) hours as needed for wheezing.  . famotidine (PEPCID) 20 MG tablet Take 1 tablet (20 mg total) by mouth 2 (two) times daily.  . FELBATOL 600 MG tablet Take 1 tab by mouth every morning, 1/2 tab every afternoon and 1 tab every evening (Patient taking differently: Take 300-600 mg by mouth See admin instructions. Take 1 tab by mouth every morning, 1/2 tab every afternoon and 1 tab every evening)  . KEPPRA 500 MG tablet Take 1/2 tablet twice per day (Patient taking differently: Take 250 mg by mouth 2 (two) times daily. )  . LAMICTAL 150 MG tablet Take 1 tab by mouth twice daily. (Patient taking differently: Take 150 mg by mouth 2 (two) times daily. Take with lamictal 25 to equal  175 mg)  . LAMICTAL 25 MG tablet Take 1 tab by mouth twice daily (Patient taking differently: Take 25 mg by mouth 2 (two) times daily. Take with lamictal 150 mg to equal 175 mg)  . predniSONE (DELTASONE) 5 MG tablet Take 5 mg by mouth daily.  . polyethylene glycol powder (GLYCOLAX/MIRALAX) powder Mix 17g of Miralax in 8 oz of water daily  . [DISCONTINUED] ondansetron (ZOFRAN ODT) 4 MG disintegrating tablet Take 1 tablet (4 mg total) by mouth every 8 (eight) hours as needed for nausea or vomiting. (Patient not taking: Reported on 05/25/2016)   No  facility-administered encounter medications on file as of 05/25/2016.      REVIEW OF SYSTEMS  : All other systems reviewed and negative except where noted in the History of Present Illness.   PHYSICAL EXAM: BP 98/68 (BP Location: Left Arm, Patient Position: Sitting, Cuff Size: Normal)   Pulse 72   Ht 5' 6.5" (1.689 m)   Wt 160 lb 3.2 oz (72.7 kg)   BMI 25.47 kg/m  General: Well developed male in no acute distress; slow mentation, does not speak much Head: Normocephalic and atraumatic Eyes:  Sclerae anicteric, conjunctiva pink. Ears: Normal auditory acuity Lungs: Clear throughout to auscultation Heart: Regular rate and rhythm Abdomen: Soft, non-distended.  Normal bowel sounds.  Non-tender. Rectal:  No external abnormalities noted.  DRE revealed some slightly hard, but mobile stool in the rectal vault, hemoccult negative.   Musculoskeletal: Symmetrical with no gross deformities  Skin: No lesions on visible extremities Extremities: No edema  Neurological: Alert oriented x 4, grossly non-focal Psychological:  Alert and cooperative. Normal mood and affect  ASSESSMENT AND PLAN: -26 year old male with complaints of abdominal pain. This is now resolved.  CT scan in November suggested possibly some localized enteritis, which was then resolved on CT scan in December.  He does seem to have some constipation on-and-off so I wonder if this is causing him some lower abdominal pain when present.  I'm going to place him on MiraLAX daily. We will try to obtain records from pediatric surgeon, Dr. Leeanne Mannan, so that we can see exactly what was performed 9 years ago.  They will contact our office back with any other issues.   CC:  Christel Mormon, MD

## 2016-05-25 NOTE — Patient Instructions (Addendum)
If you are age 24 or older, your body mass index should be between 23-30. Your Body mass index is 25.47 kg/m. If this is out of the aforementioned range listed, please consider follow up with your Primary Care Provider.  If you are age 24 or younger, your body mass index should be between 19-25. Your Body mass index is 25.47 kg/m. If this is out of the aformentioned range listed, please consider follow up with your Primary Care Provider.   We have sent the following medications to your pharmacy for you to pick up at your convenience:  Miralax   We have requested records from Dr Leeanne MannanFarooqui.  Please contact our office if symptoms return.  Thank you.

## 2016-06-01 NOTE — Progress Notes (Signed)
Reviewed and agree with documentation and assessment and plan. K. Veena Andrianna Manalang , MD   

## 2016-06-22 ENCOUNTER — Ambulatory Visit (INDEPENDENT_AMBULATORY_CARE_PROVIDER_SITE_OTHER): Payer: Medicaid Other | Admitting: Family

## 2016-06-22 ENCOUNTER — Encounter (INDEPENDENT_AMBULATORY_CARE_PROVIDER_SITE_OTHER): Payer: Self-pay | Admitting: Family

## 2016-06-22 VITALS — BP 114/74 | HR 84 | Ht 66.5 in | Wt 156.8 lb

## 2016-06-22 DIAGNOSIS — R269 Unspecified abnormalities of gait and mobility: Secondary | ICD-10-CM

## 2016-06-22 DIAGNOSIS — F71 Moderate intellectual disabilities: Secondary | ICD-10-CM | POA: Diagnosis not present

## 2016-06-22 DIAGNOSIS — G40309 Generalized idiopathic epilepsy and epileptic syndromes, not intractable, without status epilepticus: Secondary | ICD-10-CM | POA: Diagnosis not present

## 2016-06-22 DIAGNOSIS — R6250 Unspecified lack of expected normal physiological development in childhood: Secondary | ICD-10-CM

## 2016-06-22 NOTE — Progress Notes (Signed)
Patient: [name] MRN: [id number] Sex: male DOB: Feb 23, 1991  Provider: Elveria Rising, NP Location of Care: [facility]  Note type: Routine return visit  History of Present Illness: Referral Source: Theadore Nan, MD History from: patient, [facility] chart and parent Chief Complaint: Generalized convulsive epilepsy  [name] is a 26 y.o. 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 This was later thought to be gastroesophageal reflux. Nissen fundoplication caused vomiting to stop. He has organic gait disorder, essential tremor, and stuttering. He is taking and tolerating Felbatol, Lamictal, Keppra and Prednisone for his seizure disorder and has remained seizure free on these medications. [name] was last seen by Dr Sharene Skeans on Sep 23, 2015.  Today his mother reports that [name] has had no seizures since he was last seen. [name] has brief involuntary movements of his right arm in which he involuntarily flexes his wrist. He has no loss of awareness with these events.These have not proven to be seizure activity and are not problematic to him. He sometimes complains of generalized scalp soreness but there are no visible scalp lesions present.   His mother reports today that [name] has had some episodes of stomach pain in which he was seen in the ER and was ultimately determined to be constipation. She is concerned because he missed the last in the series of HPV vaccines and says that the health department says that he is now too old to receive that vaccine. [name] has been otherwise healthy since last seen. His mother has no other health concerns for him today other than previously mentioned.  Review of Systems: Please see the HPI for neurologic and other pertinent review of systems.  Otherwise, the following systems are noncontributory including constitutional, eyes, ears, nose and throat, cardiovascular, respiratory, gastrointestinal, genitourinary, musculoskeletal, skin, endocrine, hematologic/lymph, allergic/immunologic and psychiatric.   Past Medical History:  Diagnosis Date  . Asthma   . Intellectual delay   . Seizures (HCC)    Hospitalizations: No., Head Injury: No., Nervous System Infections: No., Immunizations up to date: Yes.   Past Medical History Comments: The patient has been treated with broad-spectrum antiepileptic drugs and prednisone since he came to this community in 2004. He has been admitted to [facility] with recurrent seizures. He was hospitalized November 08, 2004 with involuntary movements that were choreiform in nature. He has significant cognitive impairments, pervasive developmental delays in many domains, but no focal or lateralized neurologic findings.  MRI performed October 11, 2002 was normal. EEG Oct 05 2002 showed diffuse background slowing and multifocal sharply contoured slow-wave is principally in the central, parietal and temporal regions, right greater than left.  Surgical History Past Surgical History:  Procedure Laterality Date  . NISSEN FUNDOPLICATION  April 2009    Family History family history includes Asthma in his mother; Diabetes in his maternal grandmother; GER disease in his mother; Heart disease in his maternal grandmother; Leukemia in his maternal grandmother; Thyroid disease in his mother and sister. Family History is otherwise negative for migraines, seizures, cognitive impairment, blindness, deafness, birth defects, chromosomal disorder, autism.  Social History Social History   Social History  . Marital status: Single    Spouse name: N/A  . Number of children: N/A  . Years of education: N/A   Social History Main Topics  . Smoking status: Never Smoker  . Smokeless tobacco: Never  Used  . Alcohol use No  .  Drug use: No  . Sexual activity: No   Other Topics Concern  . None   Social History Narrative   [name] attends [employer] three days a week. He works one day at [employer]. He lives with his mother and mother's boyfriend. He enjoys basketball, riding his bike, and watching wrestling.     Allergies Allergies  Allergen Reactions  . Dilantin [Phenytoin Sodium Extended] Other (See Comments)    hallucinations  . Chloral Hydrate Hives and Rash    Physical Exam BP 114/74   Pulse 84   Ht 5' 6.5" (1.689 m)   Wt 156 lb 12.8 oz (71.1 kg)   BMI 24.93 kg/m  General: alert, well developed, well nourished, in no acute distress brown hair, round eyes, right-handed  Head: normocephalic, no dysmorphic features. He has an occipital protuberance that is unchanged.  Ears, Nose and Throat: Otoscopic: tympanic membranes normal . Pharynx: oropharynx is pink without exudates or tonsillar hypertrophy.  Neck: supple, full range of motion, no cranial or cervical bruits  Respiratory: auscultation clear  Cardiovascular: no murmurs, pulses are normal  Musculoskeletal: no skeletal deformities or apparent scoliosis  Skin: no rashes or neurocutaneous lesions, he has acne and multiple scars on his face.   Neurologic Exam  Mental Status: alert; Poor eye contact, slow to respond to commands, limited verbal output with dysarthria  Cranial Nerves: visual fields are full to double simultaneous stimuli; extraocular movements are full and conjugate; pupils are round reactive to light; funduscopic examination shows sharp disc margins with normal vessels; symmetric facial strength; midline tongue and uvula; hearing is intact and symmetric  Motor: Normal functional strength, tone, and mass; good fine motor movements; no pronator drift.  Sensory: intact responses to touch and temperature  Coordination: good finger-to-nose, rapid repetitive alternating movements and finger apposition  Gait and  Station: broad-based but stable gait and station; patient has difficulty walking on his heels, toes and tandems with difficulty; balance is poor; Romberg exam is negative; Gower response is negative  Reflexes: symmetric and diminished bilaterally; no clonus; bilateral flexor plantar responses  Impression 1. 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 History of infantile spasms and myoclonic seizures 3. Cognitive delay 4. Organic gait disorder 5. Benign essential tremor 6. Speech disorder with dysarthria and stuttering  Recommendations for plan of care The patient's previous [facility] records were reviewed. Lamberto has neither had nor required imaging or lab studies since the last visit. 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 He is taking and tolerating Felbatol, Keppra, Lamictal and Prednisone for his seizure disorder and has remained seizure free. He will continue on his medications without change for now. I asked his mother to let me know if he has any breakthrough seizures. I will see him back in follow up in 6 months or sooner if needed. Leul and his mother agreed with the plans made today.  The medication list was reviewed and reconciled.  No changes were made in the prescribed medications today.  A complete medication list was provided to the patient and his mother.  Allergies as of 06/22/2016      Reactions   Dilantin [phenytoin Sodium Extended] Other (See Comments)   hallucinations   Chloral Hydrate Hives,  Rash      Medication List       Accurate as of 06/22/16 11:59 PM. Always use your most recent med list.          albuterol (5 MG/ML) 0.5% nebulizer solution Commonly known as:  PROVENTIL Take 0.5 mLs  (2.5 mg total) by nebulization every 6 (six) hours as needed for wheezing.   famotidine 20 MG tablet Commonly known as:  PEPCID Take 1 tablet (20 mg total) by mouth 2 (two) times daily.   FELBATOL 600 MG tablet Generic drug:  felbamate Take 1 tab by mouth every morning, 1/2 tab every afternoon and 1 tab every evening   KEPPRA 500 MG tablet Generic drug:  levETIRAcetam Take 1/2 tablet twice per day   LAMICTAL 150 MG tablet Generic drug:  lamoTRIgine Take 1 tab by mouth twice daily.   LAMICTAL 25 MG tablet Generic drug:  lamoTRIgine Take 1 tab by mouth twice daily   polyethylene glycol powder powder Commonly known as:  GLYCOLAX/MIRALAX Mix 17g of Miralax in 8 oz of water daily   predniSONE 5 MG tablet Commonly known as:  DELTASONE Take 5 mg by mouth daily.       Dr. Sharene Skeans was consulted regarding the patient.   Total time spent with the patient was 30 minutes, of which 50% or more was spent in counseling and coordination of care.   Elveria Rising NP-C

## 2016-06-23 NOTE — Patient Instructions (Signed)
Continue giving [name]' medication as you have been giving it. Let me know if his seizures become more frequent.   Please plan on returning for follow up in 6 months or sooner if needed.

## 2016-06-24 MED ORDER — LAMICTAL 150 MG PO TABS: ORAL_TABLET | ORAL | 5 refills | Status: DC

## 2016-06-24 MED ORDER — PREDNISONE 5 MG PO TABS: 5.0000 mg | ORAL_TABLET | Freq: Every day | ORAL | 5 refills | Status: DC

## 2016-06-24 MED ORDER — KEPPRA 500 MG PO TABS: ORAL_TABLET | ORAL | 5 refills | Status: DC

## 2016-06-24 MED ORDER — FELBATOL 600 MG PO TABS: ORAL_TABLET | ORAL | 5 refills | Status: DC

## 2016-06-24 MED ORDER — LAMICTAL 25 MG PO TABS: ORAL_TABLET | ORAL | 5 refills | Status: DC

## 2016-10-09 ENCOUNTER — Encounter (HOSPITAL_COMMUNITY): Payer: Self-pay

## 2016-10-09 ENCOUNTER — Emergency Department (HOSPITAL_COMMUNITY)
Admission: EM | Admit: 2016-10-09 | Discharge: 2016-10-09 | Disposition: A | Discharge status: Home / Self Care | Payer: Medicaid Other | Attending: Emergency Medicine | Admitting: Emergency Medicine

## 2016-10-09 ENCOUNTER — Emergency Department (HOSPITAL_COMMUNITY): Payer: Medicaid Other

## 2016-10-09 DIAGNOSIS — M79641 Pain in right hand: Secondary | ICD-10-CM | POA: Diagnosis present

## 2016-10-09 DIAGNOSIS — L509 Urticaria, unspecified: Secondary | ICD-10-CM | POA: Insufficient documentation

## 2016-10-09 DIAGNOSIS — J45909 Unspecified asthma, uncomplicated: Secondary | ICD-10-CM | POA: Diagnosis not present

## 2016-10-09 DIAGNOSIS — Z79899 Other long term (current) drug therapy: Secondary | ICD-10-CM | POA: Diagnosis not present

## 2016-10-09 DIAGNOSIS — R21 Rash and other nonspecific skin eruption: Secondary | ICD-10-CM

## 2016-10-09 MED ORDER — DEXAMETHASONE SODIUM PHOSPHATE 10 MG/ML IJ SOLN
10.0000 mg | Freq: Once | INTRAMUSCULAR | Status: AC
  Administered 2016-10-09: 10 mg via INTRAMUSCULAR
  Filled 2016-10-09: qty 1

## 2016-10-09 MED ORDER — RANITIDINE HCL 150 MG PO TABS: 150.0000 mg | ORAL_TABLET | Freq: Two times a day (BID) | ORAL | 0 refills | Status: DC

## 2016-10-09 MED ORDER — HYDROXYZINE HCL 25 MG PO TABS: 25.0000 mg | ORAL_TABLET | Freq: Four times a day (QID) | ORAL | 0 refills | Status: DC

## 2016-10-09 NOTE — ED Provider Notes (Signed)
[facility]-EMERGENCY DEPT Provider Note   CSN: [id number] Arrival date & time: 10/09/16  1811   By signing my name below, I, Freida Busman, attest that this documentation has been prepared under the direction and in the presence of Kerrie Buffalo, NP. Electronically Signed: Freida Busman, Scribe. 10/09/2016. 8:26 PM.  History   Chief Complaint Chief Complaint  Patient presents with  . Hand Pain    The history is provided by the patient. No language interpreter was used.  Hand Pain  This is a new problem. The current episode started 6 to 12 hours ago. The problem has not changed since onset.Pertinent negatives include no abdominal pain and no headaches.     HPI Comments:  [name] is a 26 y.o. male with a history of developmental delay, who presents to the Emergency Department with his mother complaining of atraumatic, right hand pain that began today. He reports pain specifically to the back of his hand. Mom reports rash to the RUE, chest, and back that was first noticed today. She states she has a similar rash. Mom denies recent environmental changes. She notes they have a dog at home that often plays outside. No alleviating factors noted. No HA, fever, chills, or abdominal pain.    Past Medical History:  Diagnosis Date  . Asthma   . Intellectual delay   . Seizures [facility])     Patient Active Problem List   Diagnosis Date Noted  . Constipation 05/25/2016  . Generalized abdominal pain 05/25/2016  . Acne 08/02/2013  . 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 Abnormality of gait 11/17/2012  . Moderate intellectual disabilities 11/17/2012  . Developmental delay disorder 11/17/2012    Past Surgical History:  Procedure Laterality Date  . NISSEN FUNDOPLICATION  April 2009       Home Medications    Prior to Admission medications   Medication Sig Start Date End Date Taking? Authorizing Provider  albuterol  (PROVENTIL) (5 MG/ML) 0.5% nebulizer solution Take 0.5 mLs (2.5 mg total) by nebulization every 6 (six) hours as needed for wheezing. 04/16/12   Earley Favor, NP  famotidine (PEPCID) 20 MG tablet Take 1 tablet (20 mg total) by mouth 2 (two) times daily. Patient not taking: Reported on 06/22/2016 04/13/16   Jaynie Crumble, PA-C  FELBATOL 600 MG tablet Take 1 tab by mouth every morning, 1/2 tab every afternoon and 1 tab every evening 06/24/16   Elveria Rising, NP  hydrOXYzine (ATARAX/VISTARIL) 25 MG tablet Take 1 tablet (25 mg total) by mouth every 6 (six) hours. 10/09/16   Janne Napoleon, NP  KEPPRA 500 MG tablet Take 1/2 tablet twice per day 06/24/16   Elveria Rising, NP  LAMICTAL 150 MG tablet Take 1 tab by mouth twice daily. 06/24/16   Elveria Rising, NP  LAMICTAL 25 MG tablet Take 1 tab by mouth twice daily 06/24/16   Elveria Rising, NP  polyethylene glycol powder (GLYCOLAX/MIRALAX) powder Mix 17g of Miralax in 8 oz of water daily 05/25/16   Zehr, Shanda Bumps D, PA-C  predniSONE (DELTASONE) 5 MG tablet Take 1 tablet (5 mg total) by mouth daily. 06/24/16   Elveria Rising, NP  ranitidine (ZANTAC) 150 MG tablet Take 1 tablet (150 mg total) by mouth 2 (two) times daily. 10/09/16   Janne Napoleon, NP    Family History Family History  Problem Relation Age of Onset  . Asthma Mother   . GER disease Mother   . Thyroid disease Mother   .  Thyroid disease Sister   . Leukemia Maternal Grandmother   . Diabetes Maternal Grandmother   . Heart disease Maternal Grandmother     Social History Social History  Substance Use Topics  . Smoking status: Never Smoker  . Smokeless tobacco: Never Used  . Alcohol use No     Allergies   Dilantin [phenytoin sodium extended] and Chloral hydrate   Review of Systems Review of Systems  Constitutional: Negative for chills and fever.  HENT: Negative.   Eyes: Negative for redness.  Gastrointestinal: Negative for abdominal pain.  Musculoskeletal: Positive  for arthralgias and myalgias.  Skin: Positive for rash.  Neurological: Negative for headaches.     Physical Exam Updated Vital Signs BP 128/87 (BP Location: Left Arm)   Pulse 80   Temp 97.7 F (36.5 C) (Oral)   Resp 16   Ht 5\' 6"  (1.676 m)   Wt 72.6 kg (160 lb)   SpO2 98%   BMI 25.82 kg/m   Physical Exam  Constitutional: He appears well-developed and well-nourished. No distress.  HENT:  Head: Normocephalic and atraumatic.  Mouth/Throat: Oropharynx is clear and moist.  Eyes: Conjunctivae are normal.  Neck: Neck supple.  Cardiovascular: Normal rate.   Pulmonary/Chest: Effort normal.  Musculoskeletal:  Minimal swelling and tenderness to dorsum of right hand  Radial pulse is 2+ with adequate circulation  Neurological: He is alert.  Skin: Skin is warm and dry. Rash noted.  Papular rash to upper chest and BL forearms   Nursing note and vitals reviewed.    ED Treatments / Results  DIAGNOSTIC STUDIES:  Oxygen Saturation is 98% on RA, normal by my interpretation.    COORDINATION OF CARE:  8:20 PM Discussed treatment plan with pt and mother at bedside and they agreed to plan.  Labs (all labs ordered are listed, but only abnormal results are displayed) Labs Reviewed - No data to display  Radiology Dg Hand Complete Right  Result Date: 10/09/2016 CLINICAL DATA:  Central hand pain and itching. EXAM: RIGHT HAND - COMPLETE 3+ VIEW COMPARISON:  01/31/2008 FINDINGS: There is no evidence of fracture or dislocation. There is no evidence of arthropathy or other focal bone abnormality. Soft tissues are unremarkable. IMPRESSION: Negative. Electronically Signed   By: Tollie Ethavid  Kwon M.D.   On: 10/09/2016 21:13    Procedures Procedures (including critical care time)  Medications Ordered in ED Medications  dexamethasone (DECADRON) injection 10 mg (10 mg Intramuscular Given 10/09/16 2249)     Initial Impression / Assessment and Plan / ED Course  I have reviewed the triage vital signs  and the nursing notes. Patient X-Ray negative for obvious fracture or dislocation.  Pt advised to follow up with PCP as scheduled next week. Conservative therapy recommended and discussed. Patient also with nonspecific eruptions. No signs of infection. Follow up with PCP in 2-3 days. Patient will be discharged home & is agreeable with above plan. Returns precautions discussed. Pt appears safe for discharge. Ace wrap applied here in the ED and patient ready for d/c without focal neuro deficits.    Final Clinical Impressions(s) / ED Diagnoses   Final diagnoses:  Right hand pain  Rash    New Prescriptions Discharge Medication List as of 10/09/2016 10:35 PM    START taking these medications   Details  hydrOXYzine (ATARAX/VISTARIL) 25 MG tablet Take 1 tablet (25 mg total) by mouth every 6 (six) hours., Starting Fri 10/09/2016, Print    ranitidine (ZANTAC) 150 MG tablet Take 1 tablet (150 mg  total) by mouth 2 (two) times daily., Starting Fri 10/09/2016, Print      I personally performed the services described in this documentation, which was scribed in my presence. The recorded information has been reviewed and is accurate.    Kerrie Buffalo [place], [place] 10/10/16 1610    Derwood Kaplan, MD 10/10/16 [phone]

## 2016-10-09 NOTE — ED Notes (Signed)
Pt mother came to nurses desk and requested a [place] sandwich and a coke for pt. Ok per [employer]. Pt given the same.

## 2016-10-09 NOTE — ED Notes (Signed)
Patient transported to X-ray 

## 2016-10-09 NOTE — ED Triage Notes (Signed)
Pt complaining of R hand pain. Pt with full ROM. Pt with full sensation. Pt states hand itching, painful to middle of hand.

## 2016-11-07 ENCOUNTER — Emergency Department (HOSPITAL_COMMUNITY)
Admission: EM | Admit: 2016-11-07 | Discharge: 2016-11-07 | Disposition: A | Discharge status: Home / Self Care | Payer: Medicaid Other | Attending: Emergency Medicine | Admitting: Emergency Medicine

## 2016-11-07 ENCOUNTER — Emergency Department (HOSPITAL_COMMUNITY): Payer: Medicaid Other

## 2016-11-07 ENCOUNTER — Encounter (HOSPITAL_COMMUNITY): Payer: Self-pay | Admitting: *Deleted

## 2016-11-07 DIAGNOSIS — Z79899 Other long term (current) drug therapy: Secondary | ICD-10-CM | POA: Diagnosis not present

## 2016-11-07 DIAGNOSIS — G40909 Epilepsy, unspecified, not intractable, without status epilepticus: Secondary | ICD-10-CM | POA: Diagnosis not present

## 2016-11-07 DIAGNOSIS — R1031 Right lower quadrant pain: Secondary | ICD-10-CM | POA: Diagnosis present

## 2016-11-07 DIAGNOSIS — J45909 Unspecified asthma, uncomplicated: Secondary | ICD-10-CM | POA: Insufficient documentation

## 2016-11-07 LAB — URINALYSIS, ROUTINE W REFLEX MICROSCOPIC
Bilirubin Urine: NEGATIVE
GLUCOSE, UA: NEGATIVE mg/dL
HGB URINE DIPSTICK: NEGATIVE
KETONES UR: NEGATIVE mg/dL
Leukocytes, UA: NEGATIVE
Nitrite: NEGATIVE
PROTEIN: NEGATIVE mg/dL
Specific Gravity, Urine: 1.043 — ABNORMAL HIGH (ref 1.005–1.030)
pH: 6 (ref 5.0–8.0)

## 2016-11-07 LAB — COMPREHENSIVE METABOLIC PANEL
ALT: 26 U/L (ref 17–63)
AST: 27 U/L (ref 15–41)
Albumin: 4.8 g/dL (ref 3.5–5.0)
Alkaline Phosphatase: 77 U/L (ref 38–126)
Anion gap: 9 (ref 5–15)
BUN: 7 mg/dL (ref 6–20)
CHLORIDE: 105 mmol/L (ref 101–111)
CO2: 24 mmol/L (ref 22–32)
CREATININE: 1.11 mg/dL (ref 0.61–1.24)
Calcium: 9 mg/dL (ref 8.9–10.3)
GFR calc Af Amer: >60 mL/min (ref >60)
Glucose, Bld: 116 mg/dL — ABNORMAL HIGH (ref 65–99)
Potassium: 3.2 mmol/L — ABNORMAL LOW (ref 3.5–5.1)
SODIUM: 138 mmol/L (ref 135–145)
Total Bilirubin: 0.9 mg/dL (ref 0.3–1.2)
Total Protein: 7.6 g/dL (ref 6.5–8.1)

## 2016-11-07 LAB — CBC
HCT: 43.5 % (ref 39.0–52.0)
Hemoglobin: 15.7 g/dL (ref 13.0–17.0)
MCH: 31.2 pg (ref 26.0–34.0)
MCHC: 36.1 g/dL — ABNORMAL HIGH (ref 30.0–36.0)
MCV: 86.5 fL (ref 78.0–100.0)
PLATELETS: 327 10*3/uL (ref 150–400)
RBC: 5.03 MIL/uL (ref 4.22–5.81)
RDW: 12.6 % (ref 11.5–15.5)
WBC: 10.3 10*3/uL (ref 4.0–10.5)

## 2016-11-07 LAB — LIPASE, BLOOD: LIPASE: 20 U/L (ref 11–51)

## 2016-11-07 MED ORDER — SODIUM CHLORIDE 0.9 % IV BOLUS (SEPSIS)
1000.0000 mL | Freq: Once | INTRAVENOUS | Status: AC
  Administered 2016-11-07: 1000 mL via INTRAVENOUS

## 2016-11-07 MED ORDER — MORPHINE SULFATE (PF) 4 MG/ML IV SOLN
4.0000 mg | Freq: Once | INTRAVENOUS | Status: AC
  Administered 2016-11-07: 4 mg via INTRAVENOUS
  Filled 2016-11-07: qty 1

## 2016-11-07 MED ORDER — ONDANSETRON HCL 4 MG/2ML IJ SOLN
4.0000 mg | Freq: Once | INTRAMUSCULAR | Status: AC
  Administered 2016-11-07: 4 mg via INTRAVENOUS
  Filled 2016-11-07: qty 2

## 2016-11-07 MED ORDER — POTASSIUM CHLORIDE CRYS ER 20 MEQ PO TBCR
40.0000 meq | EXTENDED_RELEASE_TABLET | Freq: Once | ORAL | Status: AC
  Administered 2016-11-07: 40 meq via ORAL
  Filled 2016-11-07: qty 2

## 2016-11-07 MED ORDER — IOPAMIDOL (ISOVUE-300) INJECTION 61%
INTRAVENOUS | Status: AC
  Administered 2016-11-07: 100 mL
  Filled 2016-11-07: qty 100

## 2016-11-07 MED ORDER — ONDANSETRON 4 MG PO TBDP: 4.0000 mg | ORAL_TABLET | Freq: Three times a day (TID) | ORAL | 0 refills | Status: DC | PRN

## 2016-11-07 NOTE — ED Notes (Signed)
Pt given ginger ale and graham crackers.

## 2016-11-07 NOTE — ED Triage Notes (Signed)
Pt arrived by [facility] from home. Pt having right side abd pain today, unable to describe the specific pain. Had episode of n/v. Pt has hx of seizures, has taken all meds as prescribed, mom states that pt has new twitch around his mouth and she is concerned he is going to have a seizure.

## 2016-11-07 NOTE — Discharge Instructions (Signed)
As discussed, take plenty of fluids and stay well-hydrated. Slowly reintroduce foods starting with liquids, broths and then bland foods. Zofran as needed for nausea/vomiting. Follow-up to primary care provider.  Monitor for any worsening in condition and return if the pain worsens, follow up with fever, nausea/vomiting persists, diarrhea or any new concerning symptoms in the meantime.

## 2016-11-07 NOTE — ED Provider Notes (Signed)
[facility] DEPT Provider Note   CSN: [id number] Arrival date & time: 11/07/16  1154     History   Chief Complaint Chief Complaint  Patient presents with  . Abdominal Pain    HPI [name] is a 26 y.o. male presenting with sudden onset right-sided abdominal pain with associated nausea and vomiting starting today. Mom explains that he has difficulty localizing pain and just says it hurts and points in that general area. He was bending over and holding his right side. The pain is worse with sitting up and alleviated by lying down. Nothing tried for the pain. Denies fever, diarrhea, dysuria, testicular swelling, testicular pain, hematuria, last bowel movement was 2 days ago, he is passing flatus. No new foods or medications and patient is compliant with current medications.  HPI  Past Medical History:  Diagnosis Date  . Asthma   . Intellectual delay   . Seizures [facility])     Patient Active Problem List   Diagnosis Date Noted  . Constipation 05/25/2016  . Generalized abdominal pain 05/25/2016  . Acne 08/02/2013  . 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 Abnormality of gait 11/17/2012  . Moderate intellectual disabilities 11/17/2012  . Developmental delay disorder 11/17/2012    Past Surgical History:  Procedure Laterality Date  . NISSEN FUNDOPLICATION  April 2009       Home Medications    Prior to Admission medications   Medication Sig Start Date End Date Taking? Authorizing Provider  FELBATOL 600 MG tablet Take 1 tab by mouth every morning, 1/2 tab every afternoon and 1 tab every evening Patient taking differently: Take 300 mg by mouth 2 (two) times daily. Take 1 tab by mouth every morning, 1/2 tab every afternoon and 1 tab every evening 06/24/16  Yes Goodpasture, Inetta Fermo, NP  KEPPRA 500 MG tablet Take 1/2 tablet twice per day Patient taking differently: Take 250 mg by mouth daily. Take 1/2 tablet  twice per day 06/24/16  Yes Goodpasture, Inetta Fermo, NP  LAMICTAL 150 MG tablet Take 1 tab by mouth twice daily. Patient taking differently: Take 150 mg by mouth 2 (two) times daily. Take 1 tab by mouth twice daily. 06/24/16  Yes Elveria Rising, NP  predniSONE (DELTASONE) 5 MG tablet Take 1 tablet (5 mg total) by mouth daily. 06/24/16  Yes Elveria Rising, NP  albuterol (PROVENTIL) (5 MG/ML) 0.5% nebulizer solution Take 0.5 mLs (2.5 mg total) by nebulization every 6 (six) hours as needed for wheezing. 04/16/12   Earley Favor, NP  famotidine (PEPCID) 20 MG tablet Take 1 tablet (20 mg total) by mouth 2 (two) times daily. Patient not taking: Reported on 06/22/2016 04/13/16   Jaynie Crumble, PA-C  hydrOXYzine (ATARAX/VISTARIL) 25 MG tablet Take 1 tablet (25 mg total) by mouth every 6 (six) hours. 10/09/16   Janne Napoleon, NP  LAMICTAL 25 MG tablet Take 1 tab by mouth twice daily 06/24/16   Elveria Rising, NP  ondansetron (ZOFRAN ODT) 4 MG disintegrating tablet Take 1 tablet (4 mg total) by mouth every 8 (eight) hours as needed for nausea or vomiting. 11/07/16   Mathews Robinsons B, PA-C  polyethylene glycol powder (GLYCOLAX/MIRALAX) powder Mix 17g of Miralax in 8 oz of water daily 05/25/16   Zehr, Princella Pellegrini, PA-C  ranitidine (ZANTAC) 150 MG tablet Take 1 tablet (150 mg total) by mouth 2 (two) times daily. 10/09/16   Janne Napoleon, NP    Family History Family History  Problem Relation  Age of Onset  . Asthma Mother   . GER disease Mother   . Thyroid disease Mother   . Thyroid disease Sister   . Leukemia Maternal Grandmother   . Diabetes Maternal Grandmother   . Heart disease Maternal Grandmother     Social History Social History  Substance Use Topics  . Smoking status: Never Smoker  . Smokeless tobacco: Never Used  . Alcohol use No     Allergies   Dilantin [phenytoin sodium extended] and Chloral hydrate   Review of Systems Review of Systems  Constitutional: Negative for chills and fever.   HENT: Negative for ear pain and sore throat.   Eyes: Negative for pain and visual disturbance.  Respiratory: Negative for cough, shortness of breath and stridor.   Cardiovascular: Negative for chest pain and palpitations.  Gastrointestinal: Positive for abdominal pain, nausea and vomiting. Negative for abdominal distention, blood in stool and diarrhea.       N/V once  Genitourinary: Negative for difficulty urinating, discharge, dysuria, hematuria, penile swelling, scrotal swelling and testicular pain.  Musculoskeletal: Negative for arthralgias, back pain, gait problem, joint swelling, myalgias, neck pain and neck stiffness.  Skin: Negative for color change, pallor and rash.  Neurological: Negative for dizziness, seizures, syncope, light-headedness and headaches.     Physical Exam Updated Vital Signs BP 127/78   Pulse 100   Temp 99.4 F (37.4 C) (Oral)   SpO2 100%   Physical Exam  Constitutional: He appears well-developed and well-nourished. No distress.  Patient is afebrile, non-toxic appearing, lying in bed in mild discomfort.  HENT:  Head: Normocephalic and atraumatic.  Eyes: Conjunctivae and EOM are normal.  Neck: Normal range of motion.  Cardiovascular: Normal rate, regular rhythm and normal heart sounds.   No murmur heard. Pulmonary/Chest: Effort normal and breath sounds normal. No respiratory distress. He has no wheezes. He has no rales.  Abdominal: Soft. Bowel sounds are normal. He exhibits no distension and no mass. There is tenderness. There is no rebound and no guarding.  Flat contour, active bowel sounds. abdomen is supple and non-tender to both light and deep palpation other than RLQ and no rebound tenderness. No palpated masses. No costovertebral angle tenderness. Surgical scars midline Negative murphy's sign Negative McBurney's point tenderness   Musculoskeletal: Normal range of motion. He exhibits no edema.  Neurological: He is alert.  Skin: Skin is warm and  dry. No rash noted. He is not diaphoretic. No erythema. No pallor.  Psychiatric: He has a normal mood and affect.  Nursing note and vitals reviewed.    ED Treatments / Results  Labs (all labs ordered are listed, but only abnormal results are displayed) Labs Reviewed  COMPREHENSIVE METABOLIC PANEL - Abnormal; Notable for the following:       Result Value   Potassium 3.2 (*)    Glucose, Bld 116 (*)    All other components within normal limits  CBC - Abnormal; Notable for the following:    MCHC 36.1 (*)    All other components within normal limits  URINALYSIS, ROUTINE W REFLEX MICROSCOPIC - Abnormal; Notable for the following:    Specific Gravity, Urine 1.043 (*)    All other components within normal limits  LIPASE, BLOOD    EKG  EKG Interpretation None       Radiology Ct Abdomen Pelvis W Contrast  Result Date: 11/07/2016 CLINICAL DATA:  RIGHT-side abdominal pain, single episode of nausea and vomiting, history seizures, asthma EXAM: CT ABDOMEN AND PELVIS WITH CONTRAST  TECHNIQUE: Multidetector CT imaging of the abdomen and pelvis was performed using the standard protocol following bolus administration of intravenous contrast. Sagittal and coronal MPR images reconstructed from axial data set. CONTRAST:  ISOVUE-300 IOPAMIDOL (ISOVUE-300) INJECTION 61% IV. No oral contrast administered. COMPARISON:  04/13/2016 FINDINGS: Lower chest: Lung bases clear Hepatobiliary: Gallbladder and liver normal appearance Pancreas: Normal appearance Spleen: Normal appearance Adrenals/Urinary Tract: Adrenal glands normal appearance. Kidneys, ureters, and bladder normal appearance Stomach/Bowel: Normal appendix, retrocecal. Surgical clips at gastroesophageal junction question prior hiatal hernia repair anti reflux procedure. Mildly prominent stool in rectum. Stomach and bowel loops otherwise normal appearance. No bowel dilatation or bowel wall thickening. Vascular/Lymphatic: Normal appearance of vascular  structures. Few scattered normal sized mesenteric lymph nodes in RIGHT mid abdomen. Reproductive: Unremarkable Other: No free air or free fluid. No hernia or acute inflammatory process. Musculoskeletal: Unremarkable IMPRESSION: No acute intra-abdominal or intrapelvic abnormalities. Electronically Signed   By: Ulyses Southward M.D.   On: 11/07/2016 15:10    Procedures Procedures (including critical care time)  Medications Ordered in ED Medications  sodium chloride 0.9 % bolus 1,000 mL (0 mLs Intravenous Stopped 11/07/16 1614)  ondansetron (ZOFRAN) injection 4 mg (4 mg Intravenous Given 11/07/16 1419)  morphine 4 MG/ML injection 4 mg (4 mg Intravenous Given 11/07/16 1419)  iopamidol (ISOVUE-300) 61 % injection (100 mLs  Contrast Given 11/07/16 1450)  potassium chloride SA (K-DUR,KLOR-CON) CR tablet 40 mEq (40 mEq Oral Given 11/07/16 1618)     Initial Impression / Assessment and Plan / ED Course  I have reviewed the triage vital signs and the nursing notes.  Pertinent labs & imaging results that were available during my care of the patient were reviewed by me and considered in my medical decision making (see chart for details).     Patient presented with sudden onset right-sided pain. Difficult to assess the exact location of the pain but he points to the right side although reports discomfort diffusely.  Ordered CT abdomen pelvis  On reassessment, patient had significantly improved. NO nausea or vomiting while in ED. Successful PO challenge. Patient was drinking ginger ale and eating crackers.  CT negative  Dc home with symptomatic relief and close PCP follow up. Advised slow reintroduction of food. Zofran when necessary. Patient is stable, afebrile, nontoxic-appearing and ready to go home.  Discussed strict return precautions and advised to return to the emergency department if experiencing any new or worsening symptoms. Instructions were understood and patient agreed with discharge  plan.  Final Clinical Impressions(s) / ED Diagnoses   Final diagnoses:  Right lower quadrant abdominal pain    New Prescriptions New Prescriptions   ONDANSETRON (ZOFRAN ODT) 4 MG DISINTEGRATING TABLET    Take 1 tablet (4 mg total) by mouth every 8 (eight) hours as needed for nausea or vomiting.     Georgiana Shore, PA-C 11/07/16 1757    Maia Plan, MD 11/07/16 Ernestina Columbia

## 2016-11-12 ENCOUNTER — Telehealth (INDEPENDENT_AMBULATORY_CARE_PROVIDER_SITE_OTHER): Payer: Self-pay | Admitting: Pediatrics

## 2016-11-12 NOTE — Telephone Encounter (Signed)
  Who's calling (name and relationship to patient) : Aram BeechamCynthia, mother  Best contact number: [phone]  Provider they see: Bubba CampHickling / Tina  Reason for call: Mother called in requesting a letter explaining diagnosis, asthma, frequent nose bleeds and the need to stay in a climate controled environment.  Please call mother to pick up letter and/or for any questions regarding letter at [phone].     PRESCRIPTION REFILL ONLY  Name of prescription:  Pharmacy:

## 2016-11-12 NOTE — Telephone Encounter (Signed)
I called and talked to Mom. She said that she needs a brief letter with diagnosis of seizures and to also say that he has asthma and frequent nosebleeds, and needs to have a climate controlled environment so that he can have air conditioning. I told Mom that I would write the letter and that she could pick it up tomorrow. TG

## 2017-01-28 ENCOUNTER — Encounter (HOSPITAL_COMMUNITY): Payer: Self-pay | Admitting: Emergency Medicine

## 2017-01-28 DIAGNOSIS — Z79899 Other long term (current) drug therapy: Secondary | ICD-10-CM | POA: Diagnosis not present

## 2017-01-28 DIAGNOSIS — R109 Unspecified abdominal pain: Secondary | ICD-10-CM | POA: Insufficient documentation

## 2017-01-28 DIAGNOSIS — J45909 Unspecified asthma, uncomplicated: Secondary | ICD-10-CM | POA: Insufficient documentation

## 2017-01-28 LAB — URINALYSIS, ROUTINE W REFLEX MICROSCOPIC
Bilirubin Urine: NEGATIVE
GLUCOSE, UA: NEGATIVE mg/dL
HGB URINE DIPSTICK: NEGATIVE
Ketones, ur: NEGATIVE mg/dL
LEUKOCYTES UA: NEGATIVE
Nitrite: NEGATIVE
PROTEIN: NEGATIVE mg/dL
SPECIFIC GRAVITY, URINE: 1.017 (ref 1.005–1.030)
pH: 7 (ref 5.0–8.0)

## 2017-01-28 LAB — CBC
HCT: 42.8 % (ref 39.0–52.0)
HEMOGLOBIN: 15.2 g/dL (ref 13.0–17.0)
MCH: 31.1 pg (ref 26.0–34.0)
MCHC: 35.5 g/dL (ref 30.0–36.0)
MCV: 87.5 fL (ref 78.0–100.0)
Platelets: 367 10*3/uL (ref 150–400)
RBC: 4.89 MIL/uL (ref 4.22–5.81)
RDW: 12.5 % (ref 11.5–15.5)
WBC: 11.4 10*3/uL — AB (ref 4.0–10.5)

## 2017-01-28 LAB — COMPREHENSIVE METABOLIC PANEL
ALT: 23 U/L (ref 17–63)
AST: 28 U/L (ref 15–41)
Albumin: 4.3 g/dL (ref 3.5–5.0)
Alkaline Phosphatase: 75 U/L (ref 38–126)
Anion gap: 9 (ref 5–15)
BUN: 10 mg/dL (ref 6–20)
CHLORIDE: 108 mmol/L (ref 101–111)
CO2: 24 mmol/L (ref 22–32)
Calcium: 9.3 mg/dL (ref 8.9–10.3)
Creatinine, Ser: 0.98 mg/dL (ref 0.61–1.24)
GFR calc non Af Amer: >60 mL/min (ref >60)
Glucose, Bld: 107 mg/dL — ABNORMAL HIGH (ref 65–99)
Potassium: 4.1 mmol/L (ref 3.5–5.1)
SODIUM: 141 mmol/L (ref 135–145)
Total Bilirubin: 0.6 mg/dL (ref 0.3–1.2)
Total Protein: 7 g/dL (ref 6.5–8.1)

## 2017-01-28 LAB — LIPASE, BLOOD: LIPASE: 30 U/L (ref 11–51)

## 2017-01-28 NOTE — ED Triage Notes (Signed)
Patient arrives with complaint of sudden onset right flank pain. States it started hurting today. Patient is special needs per his mother. Mother states patient has complained of lower abdominal pain before with similar presentation and was seen. No previous history of flank pain. Mother states patient was playing flag football today and suffered no trauma during. Prior to that patient had been complaining of the pain, but it was mild. This evening around 2000 patient cried out in pain and felt sick. No emesis.

## 2017-01-29 ENCOUNTER — Emergency Department (HOSPITAL_COMMUNITY)
Admission: EM | Admit: 2017-01-29 | Discharge: 2017-01-29 | Disposition: A | Discharge status: Home / Self Care | Payer: Medicaid Other | Attending: Emergency Medicine | Admitting: Emergency Medicine

## 2017-01-29 DIAGNOSIS — R109 Unspecified abdominal pain: Secondary | ICD-10-CM

## 2017-01-29 MED ORDER — DICYCLOMINE HCL 20 MG PO TABS: 20.0000 mg | ORAL_TABLET | Freq: Two times a day (BID) | ORAL | 0 refills | Status: DC

## 2017-01-29 NOTE — ED Provider Notes (Signed)
[facility] Provider Note   CSN: [id number] Arrival date & time: 01/28/17  2051     History   Chief Complaint Chief Complaint  Patient presents with  . Flank Pain    HPI [name] is a 26 y.o. male.  Patient with history of intellectual delay, seizures -- presents with complaint of right-sided flank pain of acute onset yesterday. He was intermittent with radiation to the back. Pain resolved. Patient was waiting in emergency department for 12 hours without recurrence of symptoms. History of Nissen fundoplication. No fevers, vomiting, diarrhea. Patient has occasional constipation but none recently. No urinary symptoms. No treatments specifically for these symptoms prior to arrival. He has had 3 emergency department visits in the past year for abdominal pain. CT scan ordered at each visit was unrevealing.       Past Medical History:  Diagnosis Date  . Asthma   . Intellectual delay   . Seizures [facility])     Patient Active Problem List   Diagnosis Date Noted  . Constipation 05/25/2016  . Generalized abdominal pain 05/25/2016  . Acne 08/02/2013  . 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 Abnormality of gait 11/17/2012  . Moderate intellectual disabilities 11/17/2012  . Developmental delay disorder 11/17/2012    Past Surgical History:  Procedure Laterality Date  . NISSEN FUNDOPLICATION  April 2009       Home Medications    Prior to Admission medications   Medication Sig Start Date End Date Taking? Authorizing Provider  albuterol (PROVENTIL) (5 MG/ML) 0.5% nebulizer solution Take 0.5 mLs (2.5 mg total) by nebulization every 6 (six) hours as needed for wheezing. 04/16/12   Earley Favor, NP  dicyclomine (BENTYL) 20 MG tablet Take 1 tablet (20 mg total) by mouth 2 (two) times daily. 01/29/17   Renne Crigler, PA-C  FELBATOL 600 MG tablet Take 1 tab by mouth every morning, 1/2 tab every afternoon and  1 tab every evening Patient taking differently: Take 300 mg by mouth 2 (two) times daily. Take 1 tab by mouth every morning, 1/2 tab every afternoon and 1 tab every evening 06/24/16   Elveria Rising, NP  hydrOXYzine (ATARAX/VISTARIL) 25 MG tablet Take 1 tablet (25 mg total) by mouth every 6 (six) hours. 10/09/16   Neese, Theodora Blow, NP  KEPPRA 500 MG tablet Take 1/2 tablet twice per day Patient taking differently: Take 250 mg by mouth daily. Take 1/2 tablet twice per day 06/24/16   Elveria Rising, NP  LAMICTAL 150 MG tablet Take 1 tab by mouth twice daily. Patient taking differently: Take 150 mg by mouth 2 (two) times daily. Take 1 tab by mouth twice daily. 06/24/16   Elveria Rising, NP  LAMICTAL 25 MG tablet Take 1 tab by mouth twice daily 06/24/16   Elveria Rising, NP  ondansetron (ZOFRAN ODT) 4 MG disintegrating tablet Take 1 tablet (4 mg total) by mouth every 8 (eight) hours as needed for nausea or vomiting. 11/07/16   Mathews Robinsons B, PA-C  polyethylene glycol powder (GLYCOLAX/MIRALAX) powder Mix 17g of Miralax in 8 oz of water daily 05/25/16   Zehr, Shanda Bumps D, PA-C  predniSONE (DELTASONE) 5 MG tablet Take 1 tablet (5 mg total) by mouth daily. 06/24/16   Elveria Rising, NP  ranitidine (ZANTAC) 150 MG tablet Take 1 tablet (150 mg total) by mouth 2 (two) times daily. 10/09/16   Janne Napoleon, NP    Family History Family History  Problem Relation Age of  Onset  . Asthma Mother   . GER disease Mother   . Thyroid disease Mother   . Thyroid disease Sister   . Leukemia Maternal Grandmother   . Diabetes Maternal Grandmother   . Heart disease Maternal Grandmother     Social History Social History  Substance Use Topics  . Smoking status: Never Smoker  . Smokeless tobacco: Never Used  . Alcohol use No     Allergies   Dilantin [phenytoin sodium extended] and Chloral hydrate   Review of Systems Review of Systems  Constitutional: Negative for fever.  HENT: Negative for rhinorrhea  and sore throat.   Eyes: Negative for redness.  Respiratory: Negative for cough.   Cardiovascular: Negative for chest pain.  Gastrointestinal: Negative for abdominal pain, diarrhea, nausea and vomiting.  Genitourinary: Positive for flank pain. Negative for dysuria.  Musculoskeletal: Negative for myalgias.  Skin: Negative for rash.  Neurological: Negative for headaches.     Physical Exam Updated Vital Signs BP 124/71   Pulse 67   Temp 98.1 F (36.7 C) (Oral)   Resp 16   SpO2 100%   Physical Exam  Constitutional: He appears well-developed and well-nourished.  HENT:  Head: Normocephalic and atraumatic.  Mouth/Throat: Oropharynx is clear and moist.  Eyes: Conjunctivae are normal. Right eye exhibits no discharge. Left eye exhibits no discharge.  Neck: Normal range of motion. Neck supple.  Cardiovascular: Normal rate, regular rhythm and normal heart sounds.   No murmur heard. Pulmonary/Chest: Effort normal and breath sounds normal. No respiratory distress. He has no wheezes. He has no rales.  Abdominal: Soft. Bowel sounds are normal. There is no tenderness. There is no rebound and no guarding.  Neurological: He is alert.  Skin: Skin is warm and dry.  Psychiatric: He has a normal mood and affect.  Nursing note and vitals reviewed.    ED Treatments / Results  Labs (all labs ordered are listed, but only abnormal results are displayed) Labs Reviewed  COMPREHENSIVE METABOLIC PANEL - Abnormal; Notable for the following:       Result Value   Glucose, Bld 107 (*)    All other components within normal limits  CBC - Abnormal; Notable for the following:    WBC 11.4 (*)    All other components within normal limits  URINALYSIS, ROUTINE W REFLEX MICROSCOPIC - Abnormal; Notable for the following:    APPearance CLOUDY (*)    All other components within normal limits  LIPASE, BLOOD   Procedures Procedures (including critical care time)  Medications Ordered in ED Medications - No  data to display   Initial Impression / Assessment and Plan / ED Course  I have reviewed the triage vital signs and the nursing notes.  Pertinent labs & imaging results that were available during my care of the patient were reviewed by me and considered in my medical decision making (see chart for details).     Patient seen and examined.   Vital signs reviewed and are as follows: BP 124/71   Pulse 67   Temp 98.1 F (36.7 C) (Oral)   Resp 16   SpO2 100%   Patient's exam is essentially normal at this time. Lab work is reassuring showing only mild leukocytosis. No abdominal pain on exam. Patient states he feels well. Will discharge to home with prescription for Bentyl to use if crampy pain returns.  Mother will watch patient over the weekend. The patient was urged to return to the Emergency Department immediately with worsening of  current symptoms, worsening abdominal pain, persistent vomiting, blood noted in stools, fever, or any other concerns. The patient verbalized understanding.    Final Clinical Impressions(s) / ED Diagnoses   Final diagnoses:  Flank pain   Patient with abdominal/flank pain. Vitals are stable, no fever. Symptoms are completely resolved with completely benign exam at time of ED evaluation. Labs reassuring with only mild leukocytosis noted. Imaging not indicated -- previous CTs were essentially negative. No signs of dehydration, patient is tolerating PO's. Lungs are clear and no signs suggestive of PNA. Low concern for appendicitis, cholecystitis, pancreatitis, ruptured viscus, UTI, kidney stone, aortic dissection, aortic aneurysm or other emergent abdominal etiology. Supportive therapy indicated with return if symptoms worsen.    New Prescriptions New Prescriptions   DICYCLOMINE (BENTYL) 20 MG TABLET    Take 1 tablet (20 mg total) by mouth 2 (two) times daily.     Renne Crigler, PA-C 01/29/17 1009    Mancel Bale, MD 01/29/17 [phone]

## 2017-01-29 NOTE — ED Notes (Signed)
PA at bedside,  

## 2017-01-29 NOTE — Discharge Instructions (Signed)
Please read and follow all provided instructions.  Your diagnoses today include:  1. Flank pain     Tests performed today include:  Blood counts and electrolytes  Blood tests to check liver and kidney function  Blood tests to check pancreas function  Urine test to look for infection  Vital signs. See below for your results today.   Medications prescribed:   Bentyl - medication for intestinal cramps and spasms  Take any prescribed medications only as directed.  Home care instructions:   Follow any educational materials contained in this packet.  Follow-up instructions: Please follow-up with your primary care provider in the next 3 days for further evaluation of your symptoms.    Return instructions:  SEEK IMMEDIATE MEDICAL ATTENTION IF:  The pain does not go away or becomes severe   A temperature above 101F develops   Repeated vomiting occurs (multiple episodes)   The pain becomes localized to portions of the abdomen. The right side could possibly be appendicitis. In an adult, the left lower portion of the abdomen could be colitis or diverticulitis.   Blood is being passed in stools or vomit (bright red or black tarry stools)   You develop chest pain, difficulty breathing, dizziness or fainting, or become confused, poorly responsive, or inconsolable (young children)  If you have any other emergent concerns regarding your health  Additional Information: Abdominal (belly) pain can be caused by many things. Your caregiver performed an examination and possibly ordered blood/urine tests and imaging (CT scan, x-rays, ultrasound). Many cases can be observed and treated at home after initial evaluation in the emergency department. Even though you are being discharged home, abdominal pain can be unpredictable. Therefore, you need a repeated exam if your pain does not resolve, returns, or worsens. Most patients with abdominal pain don't have to be admitted to the hospital or  have surgery, but serious problems like appendicitis and gallbladder attacks can start out as nonspecific pain. Many abdominal conditions cannot be diagnosed in one visit, so follow-up evaluations are very important.  Your vital signs today were: BP 124/71    Pulse 67    Temp 98.1 F (36.7 C) (Oral)    Resp 16    SpO2 100%  If your blood pressure (bp) was elevated above 135/85 this visit, please have this repeated by your doctor within one month. --------------

## 2017-03-11 ENCOUNTER — Encounter (HOSPITAL_COMMUNITY): Payer: Self-pay | Admitting: Family Medicine

## 2017-03-11 ENCOUNTER — Ambulatory Visit (HOSPITAL_COMMUNITY)
Admission: EM | Admit: 2017-03-11 | Discharge: 2017-03-11 | Disposition: A | Discharge status: Home / Self Care | Payer: Medicaid Other | Attending: Emergency Medicine | Admitting: Emergency Medicine

## 2017-03-11 DIAGNOSIS — T148XXA Other injury of unspecified body region, initial encounter: Secondary | ICD-10-CM | POA: Diagnosis not present

## 2017-03-11 DIAGNOSIS — S00212A Abrasion of left eyelid and periocular area, initial encounter: Secondary | ICD-10-CM

## 2017-03-11 DIAGNOSIS — S00211A Abrasion of right eyelid and periocular area, initial encounter: Secondary | ICD-10-CM | POA: Diagnosis not present

## 2017-03-11 NOTE — ED Provider Notes (Signed)
[facility]    CSN: [id number] Arrival date & time: 03/11/17  1910     History   Chief Complaint Chief Complaint  Patient presents with  . Eye Pain    HPI [name] is a 26 y.o. male.   [name] presents with his mother with complaints of bilateral under eye abrasions which started last night. Without URI symptoms, allergies, itchy eyes, eye drainage. Without previous similar. In discussing more about cause mother realizes that patient had makeup on under his eyes for his halloween costume last night, and had to remove it in the shower, rubbing in this area.    ROS per HPI.       Past Medical History:  Diagnosis Date  . Asthma   . Intellectual delay   . Seizures [facility])     Patient Active Problem List   Diagnosis Date Noted  . Constipation 05/25/2016  . Generalized abdominal pain 05/25/2016  . Acne 08/02/2013  . 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 Abnormality of gait 11/17/2012  . Moderate intellectual disabilities 11/17/2012  . Developmental delay disorder 11/17/2012    Past Surgical History:  Procedure Laterality Date  . NISSEN FUNDOPLICATION  April 2009       Home Medications    Prior to Admission medications   Medication Sig Start Date End Date Taking? Authorizing Provider  albuterol (PROVENTIL) (5 MG/ML) 0.5% nebulizer solution Take 0.5 mLs (2.5 mg total) by nebulization every 6 (six) hours as needed for wheezing. 04/16/12   Earley Favor, NP  dicyclomine (BENTYL) 20 MG tablet Take 1 tablet (20 mg total) by mouth 2 (two) times daily. 01/29/17   Renne Crigler, PA-C  FELBATOL 600 MG tablet Take 1 tab by mouth every morning, 1/2 tab every afternoon and 1 tab every evening Patient taking differently: Take 300 mg by mouth 2 (two) times daily. Take 1 tab by mouth every morning, 1/2 tab every afternoon and 1 tab every evening 06/24/16   Elveria Rising, NP  hydrOXYzine  (ATARAX/VISTARIL) 25 MG tablet Take 1 tablet (25 mg total) by mouth every 6 (six) hours. 10/09/16   Neese, Theodora Blow, NP  KEPPRA 500 MG tablet Take 1/2 tablet twice per day Patient taking differently: Take 250 mg by mouth daily. Take 1/2 tablet twice per day 06/24/16   Elveria Rising, NP  LAMICTAL 150 MG tablet Take 1 tab by mouth twice daily. Patient taking differently: Take 150 mg by mouth 2 (two) times daily. Take 1 tab by mouth twice daily. 06/24/16   Elveria Rising, NP  LAMICTAL 25 MG tablet Take 1 tab by mouth twice daily 06/24/16   Elveria Rising, NP  ondansetron (ZOFRAN ODT) 4 MG disintegrating tablet Take 1 tablet (4 mg total) by mouth every 8 (eight) hours as needed for nausea or vomiting. 11/07/16   Mathews Robinsons B, PA-C  polyethylene glycol powder (GLYCOLAX/MIRALAX) powder Mix 17g of Miralax in 8 oz of water daily 05/25/16   Zehr, Shanda Bumps D, PA-C  predniSONE (DELTASONE) 5 MG tablet Take 1 tablet (5 mg total) by mouth daily. 06/24/16   Elveria Rising, NP  ranitidine (ZANTAC) 150 MG tablet Take 1 tablet (150 mg total) by mouth 2 (two) times daily. 10/09/16   Janne Napoleon, NP    Family History Family History  Problem Relation Age of Onset  . Asthma Mother   . GER disease Mother   . Thyroid disease Mother   . Thyroid disease Sister   .  Leukemia Maternal Grandmother   . Diabetes Maternal Grandmother   . Heart disease Maternal Grandmother     Social History Social History  Substance Use Topics  . Smoking status: Never Smoker  . Smokeless tobacco: Never Used  . Alcohol use No     Allergies   Dilantin [phenytoin sodium extended] and Chloral hydrate   Review of Systems Review of Systems   Physical Exam Triage Vital Signs ED Triage Vitals [03/11/17 1936]  Enc Vitals Group     BP 129/60     Pulse Rate 83     Resp 18     Temp 98.3 F (36.8 C)     Temp src      SpO2 98 %     Weight      Height      Head Circumference      Peak Flow      Pain Score      Pain  Loc      Pain Edu?      Excl. in GC?    No data found.   Updated Vital Signs BP 129/60 (BP Location: Right Arm)   Pulse 83   Temp 98.3 F (36.8 C)   Resp 18   SpO2 98%   Visual Acuity Right Eye Distance:   Left Eye Distance:   Bilateral Distance:    Right Eye Near:   Left Eye Near:    Bilateral Near:     Physical Exam  Constitutional: He appears well-developed and well-nourished. No distress.  HENT:  Head: Head is without raccoon's eyes, without Battle's sign, without right periorbital erythema and without left periorbital erythema.  Right Ear: Tympanic membrane, external ear and ear canal normal.  Left Ear: Tympanic membrane and external ear normal.  Nose: Nose normal.  Mouth/Throat: Uvula is midline, oropharynx is clear and moist and mucous membranes are normal. No tonsillar exudate.  Eyes: Pupils are equal, round, and reactive to light. Conjunctivae, EOM and lids are normal.  Cardiovascular: Normal rate and regular rhythm.   Pulmonary/Chest: Effort normal and breath sounds normal.  Neurological: He is alert.  Skin: Skin is warm and dry. Abrasion noted.  Bilateral under eyes with ~305mm abrasions  Vitals reviewed.    UC Treatments / Results  Labs (all labs ordered are listed, but only abnormal results are displayed) Labs Reviewed - No data to display  EKG  EKG Interpretation None       Radiology No results found.  Procedures Procedures (including critical care time)  Medications Ordered in UC Medications - No data to display   Initial Impression / Assessment and Plan / UC Course  I have reviewed the triage vital signs and the nursing notes.  Pertinent labs & imaging results that were available during my care of the patient were reviewed by me and considered in my medical decision making (see chart for details).     Redness under eyes consistent with abrasions, likely related to rubbing off of eye makeup and/or irritation to sensitive skin from  the makeup. Mother verbalizes agreement of diagnosis. Keep area clean and dry, try to avoid rubbing or touching. Wash as typically would in shower. Patient's mother verbalized understanding and agreeable to plan.      Final Clinical Impressions(s) / UC Diagnoses   Final diagnoses:  Abrasion    New Prescriptions Discharge Medication List as of 03/11/2017  8:21 PM       Controlled Substance Prescriptions [place] Controlled Substance Registry consulted? Not Applicable  Georgetta Haber, NP 03/11/17 [phone]

## 2017-03-11 NOTE — Discharge Instructions (Signed)
Keep area clean and dry, wash normally with soap and water. Try to avoid touching to keep it clean

## 2017-03-11 NOTE — ED Triage Notes (Addendum)
Pt here bilateral eye bruising underneath eye and pain. Unsure of how it happened. Pt special needs and unable to communicate well.

## 2017-04-26 ENCOUNTER — Telehealth (INDEPENDENT_AMBULATORY_CARE_PROVIDER_SITE_OTHER): Payer: Self-pay | Admitting: Family

## 2017-04-26 NOTE — Telephone Encounter (Signed)
Spoke to mom about the letter that she is requesting. She states that Inetta Fermoina has written a letter before for her that explained Jasiel's condition.

## 2017-04-26 NOTE — Telephone Encounter (Signed)
°  Who's calling (name and relationship to patient) : Aram BeechamCynthia, mother Best contact number: [phone] Provider they see: Cloretta NedQuan Reason for call: Mother is requesting a letter be written to help cover the cost of their electric bill.     PRESCRIPTION REFILL ONLY  Name of prescription:  Pharmacy:

## 2017-04-27 NOTE — Telephone Encounter (Signed)
I called Mom and left a message. I will call her again tomorrow. TG

## 2017-04-28 NOTE — Telephone Encounter (Signed)
I called and talked to Mom. She said that she needed the same letter that was written in July regarding a climate controlled environment, but needs it dated now. I told Mom that I would reprint the letter with current date and have Dr Sharene SkeansHickling to sign it. Mom wants the letter mailed to her when the letter is ready. TG

## 2017-04-29 NOTE — Telephone Encounter (Signed)
I mailed the signed letter to Mom. TG

## 2017-05-30 ENCOUNTER — Encounter (HOSPITAL_COMMUNITY): Payer: Self-pay | Admitting: Emergency Medicine

## 2017-05-30 ENCOUNTER — Observation Stay (HOSPITAL_COMMUNITY)
Admission: EM | Admit: 2017-05-30 | Discharge: 2017-06-01 | Disposition: A | Discharge status: Home / Self Care | Payer: Medicaid Other | Attending: Internal Medicine | Admitting: Internal Medicine

## 2017-05-30 ENCOUNTER — Other Ambulatory Visit: Payer: Self-pay

## 2017-05-30 DIAGNOSIS — K625 Hemorrhage of anus and rectum: Secondary | ICD-10-CM | POA: Insufficient documentation

## 2017-05-30 DIAGNOSIS — R569 Unspecified convulsions: Secondary | ICD-10-CM

## 2017-05-30 DIAGNOSIS — G40909 Epilepsy, unspecified, not intractable, without status epilepticus: Principal | ICD-10-CM | POA: Insufficient documentation

## 2017-05-30 DIAGNOSIS — G40901 Epilepsy, unspecified, not intractable, with status epilepticus: Secondary | ICD-10-CM | POA: Diagnosis present

## 2017-05-30 DIAGNOSIS — J45909 Unspecified asthma, uncomplicated: Secondary | ICD-10-CM | POA: Insufficient documentation

## 2017-05-30 DIAGNOSIS — Z79899 Other long term (current) drug therapy: Secondary | ICD-10-CM | POA: Diagnosis not present

## 2017-05-30 DIAGNOSIS — F71 Moderate intellectual disabilities: Secondary | ICD-10-CM | POA: Insufficient documentation

## 2017-05-30 DIAGNOSIS — S31809A Unspecified open wound of unspecified buttock, initial encounter: Secondary | ICD-10-CM | POA: Diagnosis present

## 2017-05-30 DIAGNOSIS — G40309 Generalized idiopathic epilepsy and epileptic syndromes, not intractable, without status epilepticus: Secondary | ICD-10-CM

## 2017-05-30 LAB — TYPE AND SCREEN
ABO/RH(D): O NEG
ANTIBODY SCREEN: NEGATIVE

## 2017-05-30 LAB — CBC
HCT: 47.7 % (ref 39.0–52.0)
Hemoglobin: 17.1 g/dL — ABNORMAL HIGH (ref 13.0–17.0)
MCH: 31 pg (ref 26.0–34.0)
MCHC: 35.8 g/dL (ref 30.0–36.0)
MCV: 86.6 fL (ref 78.0–100.0)
Platelets: 372 10*3/uL (ref 150–400)
RBC: 5.51 MIL/uL (ref 4.22–5.81)
RDW: 12.3 % (ref 11.5–15.5)
WBC: 9.3 10*3/uL (ref 4.0–10.5)

## 2017-05-30 LAB — COMPREHENSIVE METABOLIC PANEL
ALT: 20 U/L (ref 17–63)
AST: 24 U/L (ref 15–41)
Albumin: 4.5 g/dL (ref 3.5–5.0)
Alkaline Phosphatase: 95 U/L (ref 38–126)
Anion gap: 12 (ref 5–15)
BILIRUBIN TOTAL: 0.9 mg/dL (ref 0.3–1.2)
BUN: 10 mg/dL (ref 6–20)
CHLORIDE: 103 mmol/L (ref 101–111)
CO2: 22 mmol/L (ref 22–32)
Calcium: 9.3 mg/dL (ref 8.9–10.3)
Creatinine, Ser: 0.91 mg/dL (ref 0.61–1.24)
Glucose, Bld: 98 mg/dL (ref 65–99)
Potassium: 3.6 mmol/L (ref 3.5–5.1)
Sodium: 137 mmol/L (ref 135–145)
TOTAL PROTEIN: 7.6 g/dL (ref 6.5–8.1)

## 2017-05-30 NOTE — ED Notes (Signed)
Pt's mother called out for help in the waiting room stating "He's having another seizure". Pt was not shaking. Pt following all commands and talking. Pt's mother requested Pt be checked out by an Charity fundraiserN. Pt taken back to Triage Room. Pt currently talking on cell phone.

## 2017-05-30 NOTE — ED Triage Notes (Signed)
Mom reports hearing patient yell from the bathroom.  On arrival to bathroom found in floor over toilet.  Mom reports finding blood in the stool.  Reports bright red blood.  Endorses that when walking patient into ER he began shaking and she sat him on the ground.  Hx of seizures.

## 2017-05-30 NOTE — ED Provider Notes (Signed)
[facility] EMERGENCY DEPARTMENT Provider Note   CSN: [id number] Arrival date & time: 05/30/17  2118     History   Chief Complaint Chief Complaint  Patient presents with  . Rectal Bleeding    HPI [name] is a 27 y.o. male with a hx of developmental delay, asthma, seizures presents to the Emergency Department after rectal bleeding and several seizure.    Level 5 Caveat for AMS.  Pt's mother (and legal guardian) at bedside and provides the entire history.  Reports that today patient to the bathroom alone and she heard him calling her name.  When she arrived patient was slumped over on the toilet with blood in the toilet.  She reports that when she wiped he had lots of blood.  She does not know exactly where it was coming from.  Other also reports that patient has had several seizures since arrival in the emergency department.  She reports long history of seizures for which he takes Keppra, Lamictal and Felbatol.  She reports he has not had his night doses of these medications but he did have his morning doses.  She denies any missed doses.  She states when they were walking into the emergency department he stopped and began shaking.  She reports she was able to assist him to the ground and he did not fall or hit his head.  She reports since that time he has had 2 additional episodes similar to this but at no point has he returned to his baseline.  She reports he is normally alert and talkative however he has not done any talking since arrival here in the emergency department.  She denies recent illness, fevers or other signs of infection.  She reports that his been many years since he had a seizure.  He is followed by Dr. Sharene Skeans of pediatric neurology.  The history is provided by the patient and medical records. No language interpreter was used.    Past Medical History:  Diagnosis Date  . Asthma   . Intellectual delay   . Seizures [facility]     Patient Active  Problem List   Diagnosis Date Noted  . Seizure (HCC) 05/31/2017  . Seizures (HCC) 05/31/2017  . Constipation 05/25/2016  . Generalized abdominal pain 05/25/2016  . Acne 08/02/2013  . 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 Abnormality of gait 11/17/2012  . Moderate intellectual disabilities 11/17/2012  . Developmental delay disorder 11/17/2012    Past Surgical History:  Procedure Laterality Date  . NISSEN FUNDOPLICATION  April 2009       Home Medications    Prior to Admission medications   Medication Sig Start Date End Date Taking? Authorizing Provider  albuterol (PROVENTIL) (5 MG/ML) 0.5% nebulizer solution Take 0.5 mLs (2.5 mg total) by nebulization every 6 (six) hours as needed for wheezing. 04/16/12  Yes Earley Favor, NP  FELBATOL 600 MG tablet Take 1 tab by mouth every morning, 1/2 tab every afternoon and 1 tab every evening Patient taking differently: Take 300 mg by mouth 2 (two) times daily. Take 1 tab by mouth every morning, 1/2 tab every afternoon and 1 tab every evening 06/24/16  Yes Goodpasture, Inetta Fermo, NP  hydrOXYzine (ATARAX/VISTARIL) 25 MG tablet Take 1 tablet (25 mg total) by mouth every 6 (six) hours. Patient taking differently: Take 25 mg by mouth every 6 (six) hours as needed for anxiety or itching.  10/09/16  Yes Janne Napoleon, NP  KEPPRA 500 MG tablet Take 1/2 tablet twice per day Patient taking differently: Take 250 mg by mouth daily. Take 1/2 tablet twice per day 06/24/16  Yes Goodpasture, Inetta Fermo, NP  LAMICTAL 150 MG tablet Take 1 tab by mouth twice daily. Patient taking differently: Take 150 mg by mouth 2 (two) times daily. Take 1 tab by mouth twice daily. 06/24/16  Yes Elveria Rising, NP  LAMICTAL 25 MG tablet Take 1 tab by mouth twice daily 06/24/16  Yes Goodpasture, Inetta Fermo, NP  ondansetron (ZOFRAN ODT) 4 MG disintegrating tablet Take 1 tablet (4 mg total) by mouth every 8 (eight) hours as needed for  nausea or vomiting. 11/07/16  Yes Mathews Robinsons B, PA-C  polyethylene glycol powder (GLYCOLAX/MIRALAX) powder Mix 17g of Miralax in 8 oz of water daily Patient taking differently: Take 0.5 Containers by mouth daily as needed for mild constipation. Mix 17g of Miralax in 8 oz of water 05/25/16  Yes Zehr, Shanda Bumps D, PA-C  predniSONE (DELTASONE) 5 MG tablet Take 1 tablet (5 mg total) by mouth daily. 06/24/16  Yes Elveria Rising, NP    Family History Family History  Problem Relation Age of Onset  . Asthma Mother   . GER disease Mother   . Thyroid disease Mother   . Thyroid disease Sister   . Leukemia Maternal Grandmother   . Diabetes Maternal Grandmother   . Heart disease Maternal Grandmother     Social History Social History   Tobacco Use  . Smoking status: Never Smoker  . Smokeless tobacco: Never Used  Substance Use Topics  . Alcohol use: No    Alcohol/week: 0.0 oz  . Drug use: No     Allergies   Dilantin [phenytoin sodium extended] and Chloral hydrate   Review of Systems Review of Systems  Unable to perform ROS: Mental status change     Physical Exam Updated Vital Signs BP 132/74   Pulse 70   Temp 97.9 F (36.6 C)   Resp 16   Ht 5\' 6"  (1.676 m)   Wt 72.6 kg (160 lb)   SpO2 99%   BMI 25.82 kg/m   Physical Exam  Constitutional: He appears well-developed and well-nourished. No distress.  Awake  HENT:  Head: Normocephalic and atraumatic.  Eyes: Conjunctivae are normal. No scleral icterus.  Neck: Normal range of motion. Neck supple.  No nuchal rigidity  Cardiovascular: Normal rate, regular rhythm and intact distal pulses.  Pulmonary/Chest: Effort normal and breath sounds normal. No respiratory distress. He has no wheezes.  Equal chest expansion  Abdominal: Soft. Bowel sounds are normal. He exhibits no mass. There is no tenderness. There is no rebound and no guarding.  Genitourinary: Rectal exam shows mass. Rectal exam shows no external hemorrhoid, no  internal hemorrhoid, no fissure, no tenderness and anal tone normal.     Genitourinary Comments: Chaperone present No bleeding from the rectum.  Brown stool on DRE.  No internal or external hemorrhoids.  Musculoskeletal: Normal range of motion. He exhibits no edema.  Neurological: He is alert. GCS eye subscore is 4. GCS verbal subscore is 1. GCS motor subscore is 6.  Mental Status:  Alert, intermittently follows commands. Cranial Nerves:  Pupils equal, round, reactive to light Ptosis not present, extra-ocular motions intact bilaterally as pt tracks around the room Distal pulses palpable throughout  No clonus Grip strength 4/5 bilaterally.  Pt unable to lift his legs off the bed.  Skin: Skin is warm and dry. He is not diaphoretic.  Psychiatric: He  has a normal mood and affect.  Nursing note and vitals reviewed.    ED Treatments / Results  Labs (all labs ordered are listed, but only abnormal results are displayed) Labs Reviewed  CBC - Abnormal; Notable for the following components:      Result Value   Hemoglobin 17.1 (*)    All other components within normal limits  COMPREHENSIVE METABOLIC PANEL - Abnormal; Notable for the following components:   Glucose, Bld 101 (*)    All other components within normal limits  POC OCCULT BLOOD, ED - Abnormal; Notable for the following components:   Fecal Occult Bld POSITIVE (*)    All other components within normal limits  COMPREHENSIVE METABOLIC PANEL  MAGNESIUM  CBC WITH DIFFERENTIAL/PLATELET  URINALYSIS, ROUTINE W REFLEX MICROSCOPIC  RAPID URINE DRUG SCREEN, HOSP PERFORMED  LAMOTRIGINE LEVEL  HIV ANTIBODY (ROUTINE TESTING)  TYPE AND SCREEN  ABO/RH    EKG  EKG Interpretation  Date/Time:  Sunday May 30 2017 21:25:11 EST Ventricular Rate:  78 PR Interval:  130 QRS Duration: 70 QT Interval:  360 QTC Calculation: 410 R Axis:   45 Text Interpretation:  Normal sinus rhythm Normal ECG No acute changes No significant change  since last tracing Confirmed by Derwood Kaplan [phone]) on 05/31/2017 12:36:34 AM       Radiology Ct Head Wo Contrast  Result Date: 05/31/2017 CLINICAL DATA:  Altered mental status.  Possible seizure. EXAM: CT HEAD WITHOUT CONTRAST TECHNIQUE: Contiguous axial images were obtained from the base of the skull through the vertex without intravenous contrast. COMPARISON:  Head CT 09/29/2005. FINDINGS: Brain: No mass lesion, intraparenchymal hemorrhage or extra-axial collection. No evidence of acute cortical infarct. Brain parenchyma and CSF-containing spaces are normal for age. Vascular: No hyperdense vessel or unexpected calcification. Skull: Normal visualized skull base, calvarium and extracranial soft tissues. Sinuses/Orbits: No sinus fluid levels or advanced mucosal thickening. No mastoid effusion. Normal orbits. IMPRESSION: Normal head CT. Electronically Signed   By: Deatra Robinson M.D.   On: 05/31/2017 01:40    Procedures .Critical Care Performed by: Dierdre Forth, PA-C Authorized by: Dierdre Forth, PA-C   Critical care provider statement:    Critical care time (minutes):  30   Critical care time was exclusive of:  Separately billable procedures and treating other patients   Critical care was necessary to treat or prevent imminent or life-threatening deterioration of the following conditions:  CNS failure or compromise   Critical care was time spent personally by me on the following activities:  Blood draw for specimens, development of treatment plan with patient or surrogate, discussions with consultants, evaluation of patient's response to treatment, examination of patient, obtaining history from patient or surrogate, ordering and review of laboratory studies, ordering and review of radiographic studies, re-evaluation of patient's condition and review of old charts   (including critical care time)  Medications Ordered in ED Medications  albuterol (PROVENTIL) (2.5 MG/3ML)  0.083% nebulizer solution 2.5 mg (not administered)  acetaminophen (TYLENOL) tablet 650 mg (not administered)    Or  acetaminophen (TYLENOL) suppository 650 mg (not administered)  ondansetron (ZOFRAN) tablet 4 mg (not administered)    Or  ondansetron (ZOFRAN) injection 4 mg (not administered)  0.9 %  sodium chloride infusion ( Intravenous New Bag/Given 05/31/17 0257)  LORazepam (ATIVAN) injection 1 mg (not administered)  levETIRAcetam (KEPPRA) tablet 500 mg (not administered)  felbamate (FELBATOL) 600 MG/5ML suspension 600 mg (not administered)    And  felbamate (FELBATOL) 600 MG/5ML suspension 300 mg (not  administered)  lamoTRIgine (LAMICTAL) tablet 175 mg (not administered)  levETIRAcetam (KEPPRA) 1,500 mg in sodium chloride 0.9 % 100 mL IVPB (0 mg Intravenous Stopped 05/31/17 0129)  felbamate (FELBATOL) 600 MG/5ML suspension 600 mg (600 mg Oral Given 05/31/17 0253)     Initial Impression / Assessment and Plan / ED Course  I have reviewed the triage vital signs and the nursing notes.  Pertinent labs & imaging results that were available during my care of the patient were reviewed by me and considered in my medical decision making (see chart for details).  Clinical Course as of May 31 714  Mon May 31, 2017  [id number] Discussed with Dr. Lyman BishopArror who will evaluate  [HM]  0227 Discussed with Dr. Toniann FailKakrakandy who will admit.  [HM]  0300 Patient mental status continues to improve.  He is now following more commands and is more alert.  [HM]    Clinical Course User Index [HM] Winfrey Chillemi, Dahlia ClientHannah, [place] JerseyPA-C    Patient presents with multiple complaints.  Most concerningly, patient has had 4 seizures today without return to baseline between.  No missed medication doses.  Patient loaded with Keppra.  After reevaluation as his mental status improved, patient was able to pass a swallow screen in his home medication of Felbatol was given.  Patient was discussed with and evaluated by neurology.  CT scan was  negative for acute intracranial pathology.  Labs were reassuring.  No additional seizures during ED stay.  Multiple re-evaluations and patient's mental status continues to improve.  Additionally, mother concerned about rectal bleeding.  On exam patient with small wound to the gluteal cleft suspicious for ruptured small abscess.  No rectal pain or fistula noted.  No hemorrhoids.  Stool on DRE is brown.  Suspect bleeding is from the wound and not from his rectum.  Abdomen is soft and nontender.  Patient admitted to try at hospitalist for further evaluation and treatment.  Final Clinical Impressions(s) / ED Diagnoses   Final diagnoses:  Wound of gluteal cleft, unspecified laterality, initial encounter  Status epilepticus [facility](HCC)    ED Discharge Orders    None       Tristian Sickinger, Boyd KerbsHannah, PA-C 05/31/17 0719    Derwood KaplanNanavati, Ankit, MD 05/31/17 [phone]

## 2017-05-31 ENCOUNTER — Encounter (HOSPITAL_COMMUNITY): Payer: Self-pay | Admitting: Internal Medicine

## 2017-05-31 ENCOUNTER — Emergency Department (HOSPITAL_COMMUNITY): Payer: Medicaid Other

## 2017-05-31 ENCOUNTER — Other Ambulatory Visit: Payer: Self-pay

## 2017-05-31 ENCOUNTER — Telehealth (INDEPENDENT_AMBULATORY_CARE_PROVIDER_SITE_OTHER): Payer: Self-pay | Admitting: Pediatrics

## 2017-05-31 DIAGNOSIS — R569 Unspecified convulsions: Secondary | ICD-10-CM | POA: Diagnosis not present

## 2017-05-31 LAB — CBC WITH DIFFERENTIAL/PLATELET
Basophils Absolute: 0.1 10*3/uL (ref 0.0–0.1)
Basophils Relative: 1 %
Eosinophils Absolute: 0.1 10*3/uL (ref 0.0–0.7)
Eosinophils Relative: 1 %
HEMATOCRIT: 46.5 % (ref 39.0–52.0)
HEMOGLOBIN: 16.6 g/dL (ref 13.0–17.0)
LYMPHS ABS: 1.7 10*3/uL (ref 0.7–4.0)
LYMPHS PCT: 16 %
MCH: 31 pg (ref 26.0–34.0)
MCHC: 35.7 g/dL (ref 30.0–36.0)
MCV: 86.9 fL (ref 78.0–100.0)
MONOS PCT: 8 %
Monocytes Absolute: 0.8 10*3/uL (ref 0.1–1.0)
NEUTROS ABS: 7.6 10*3/uL (ref 1.7–7.7)
Neutrophils Relative %: 74 %
Platelets: 383 10*3/uL (ref 150–400)
RBC: 5.35 MIL/uL (ref 4.22–5.81)
RDW: 12.6 % (ref 11.5–15.5)
WBC: 10.1 10*3/uL (ref 4.0–10.5)

## 2017-05-31 LAB — URINALYSIS, ROUTINE W REFLEX MICROSCOPIC
Bilirubin Urine: NEGATIVE
GLUCOSE, UA: NEGATIVE mg/dL
Hgb urine dipstick: NEGATIVE
KETONES UR: NEGATIVE mg/dL
Leukocytes, UA: NEGATIVE
NITRITE: NEGATIVE
PH: 7 (ref 5.0–8.0)
Protein, ur: NEGATIVE mg/dL
SPECIFIC GRAVITY, URINE: 1.012 (ref 1.005–1.030)

## 2017-05-31 LAB — COMPREHENSIVE METABOLIC PANEL
ALK PHOS: 91 U/L (ref 38–126)
ALT: 18 U/L (ref 17–63)
AST: 25 U/L (ref 15–41)
Albumin: 4.4 g/dL (ref 3.5–5.0)
Anion gap: 12 (ref 5–15)
BUN: 9 mg/dL (ref 6–20)
CALCIUM: 9.4 mg/dL (ref 8.9–10.3)
CHLORIDE: 103 mmol/L (ref 101–111)
CO2: 22 mmol/L (ref 22–32)
CREATININE: 0.91 mg/dL (ref 0.61–1.24)
GFR calc non Af Amer: >60 mL/min (ref >60)
Glucose, Bld: 101 mg/dL — ABNORMAL HIGH (ref 65–99)
Potassium: 4 mmol/L (ref 3.5–5.1)
Sodium: 137 mmol/L (ref 135–145)
Total Bilirubin: 0.9 mg/dL (ref 0.3–1.2)
Total Protein: 7.5 g/dL (ref 6.5–8.1)

## 2017-05-31 LAB — GLUCOSE, CAPILLARY
GLUCOSE-CAPILLARY: 162 mg/dL — AB (ref 65–99)
Glucose-Capillary: 118 mg/dL — ABNORMAL HIGH (ref 65–99)

## 2017-05-31 LAB — RAPID URINE DRUG SCREEN, HOSP PERFORMED
Amphetamines: NOT DETECTED
BARBITURATES: NOT DETECTED
Benzodiazepines: NOT DETECTED
COCAINE: NOT DETECTED
Opiates: NOT DETECTED
TETRAHYDROCANNABINOL: NOT DETECTED

## 2017-05-31 LAB — POC OCCULT BLOOD, ED: Fecal Occult Bld: POSITIVE — AB

## 2017-05-31 LAB — MAGNESIUM: Magnesium: 2.1 mg/dL (ref 1.7–2.4)

## 2017-05-31 LAB — ABO/RH: ABO/RH(D): O NEG

## 2017-05-31 LAB — HIV ANTIBODY (ROUTINE TESTING W REFLEX): HIV SCREEN 4TH GENERATION: NONREACTIVE

## 2017-05-31 MED ORDER — FELBAMATE 600 MG/5ML PO SUSP
600.0000 mg | Freq: Once | ORAL | Status: AC
  Administered 2017-05-31: 600 mg via ORAL
  Filled 2017-05-31: qty 5

## 2017-05-31 MED ORDER — SODIUM CHLORIDE 0.9 % IV SOLN
INTRAVENOUS | Status: AC
  Administered 2017-05-31: 03:00:00 via INTRAVENOUS

## 2017-05-31 MED ORDER — FELBAMATE 600 MG PO TABS: 300.0000 mg | ORAL_TABLET | Freq: Two times a day (BID) | ORAL | Status: DC

## 2017-05-31 MED ORDER — LEVETIRACETAM 250 MG PO TABS: 250.0000 mg | ORAL_TABLET | Freq: Every day | ORAL | Status: DC

## 2017-05-31 MED ORDER — LORAZEPAM 2 MG/ML IJ SOLN: 1.0000 mg | INTRAMUSCULAR | Status: DC | PRN

## 2017-05-31 MED ORDER — ACETAMINOPHEN 650 MG RE SUPP: 650.0000 mg | Freq: Four times a day (QID) | RECTAL | Status: DC | PRN

## 2017-05-31 MED ORDER — SODIUM CHLORIDE 0.9 % IV SOLN
1500.0000 mg | Freq: Once | INTRAVENOUS | Status: AC
  Administered 2017-05-31: 1500 mg via INTRAVENOUS
  Filled 2017-05-31: qty 15

## 2017-05-31 MED ORDER — ONDANSETRON HCL 4 MG PO TABS: 4.0000 mg | ORAL_TABLET | Freq: Four times a day (QID) | ORAL | Status: DC | PRN

## 2017-05-31 MED ORDER — INFLUENZA VAC SPLIT QUAD 0.5 ML IM SUSY
0.5000 mL | PREFILLED_SYRINGE | INTRAMUSCULAR | Status: DC
  Filled 2017-05-31: qty 0.5

## 2017-05-31 MED ORDER — PREDNISONE 5 MG PO TABS
5.0000 mg | ORAL_TABLET | Freq: Every day | ORAL | Status: DC
  Administered 2017-05-31 – 2017-06-01 (×2): 5 mg via ORAL
  Filled 2017-05-31 (×2): qty 1

## 2017-05-31 MED ORDER — SODIUM CHLORIDE 0.9 % IV SOLN
1000.0000 mg | Freq: Once | INTRAVENOUS | Status: DC
  Filled 2017-05-31: qty 10

## 2017-05-31 MED ORDER — ALBUTEROL SULFATE (2.5 MG/3ML) 0.083% IN NEBU: 2.5000 mg | INHALATION_SOLUTION | Freq: Four times a day (QID) | RESPIRATORY_TRACT | Status: DC | PRN

## 2017-05-31 MED ORDER — LAMOTRIGINE 25 MG PO TABS: 25.0000 mg | ORAL_TABLET | Freq: Two times a day (BID) | ORAL | Status: DC

## 2017-05-31 MED ORDER — ONDANSETRON HCL 4 MG/2ML IJ SOLN: 4.0000 mg | Freq: Four times a day (QID) | INTRAMUSCULAR | Status: DC | PRN

## 2017-05-31 MED ORDER — LEVETIRACETAM 500 MG PO TABS: 500.0000 mg | ORAL_TABLET | Freq: Every day | ORAL | Status: DC

## 2017-05-31 MED ORDER — FELBAMATE 600 MG/5ML PO SUSP
300.0000 mg | ORAL | Status: DC
  Administered 2017-05-31: 300 mg via ORAL
  Filled 2017-05-31 (×2): qty 2.5

## 2017-05-31 MED ORDER — CEPHALEXIN 250 MG PO CAPS: 500.0000 mg | ORAL_CAPSULE | Freq: Three times a day (TID) | ORAL | Status: DC

## 2017-05-31 MED ORDER — LEVETIRACETAM 500 MG PO TABS
500.0000 mg | ORAL_TABLET | Freq: Two times a day (BID) | ORAL | Status: DC
  Administered 2017-05-31 – 2017-06-01 (×3): 500 mg via ORAL
  Filled 2017-05-31 (×3): qty 1

## 2017-05-31 MED ORDER — LAMOTRIGINE 150 MG PO TABS: 150.0000 mg | ORAL_TABLET | Freq: Two times a day (BID) | ORAL | Status: DC

## 2017-05-31 MED ORDER — ACETAMINOPHEN 325 MG PO TABS: 650.0000 mg | ORAL_TABLET | Freq: Four times a day (QID) | ORAL | Status: DC | PRN

## 2017-05-31 MED ORDER — LAMOTRIGINE 25 MG PO TABS
175.0000 mg | ORAL_TABLET | Freq: Two times a day (BID) | ORAL | Status: DC
  Administered 2017-05-31 – 2017-06-01 (×2): 175 mg via ORAL
  Filled 2017-05-31 (×4): qty 1

## 2017-05-31 MED ORDER — FELBAMATE 600 MG/5ML PO SUSP
600.0000 mg | Freq: Two times a day (BID) | ORAL | Status: DC
  Administered 2017-05-31 – 2017-06-01 (×2): 600 mg via ORAL
  Filled 2017-05-31 (×4): qty 5

## 2017-05-31 NOTE — H&P (Signed)
History and Physical    [name] OZH:[id number] DOB: November 28, 1990 DOA: 05/30/2017  PCP: Christel Mormon, MD  Patient coming from: Home.  History obtained from patient's mother.  Chief Complaint: Seizures.  HPI: [name] is a 27 y.o. male with known history of seizures, asthma, intellectual delay was brought to the ER after patient had staring spells which was typical of his seizures.  Last evening around 8 PM patient was noticed to have a staring spell which lasted for 2 minutes following which patient was confused for about 1 minute.  Patient did not lose consciousness.  On noticing this patient's mother brought patient to the ER in the car.  On the way walking to the ER from the corpora patient had another seizure.  Patient was brought to the ER and had had another 2 additional episodes which lasted for same duration with confusion.  Per patient's mother patient did not have any fever or chills headache nausea vomiting abdominal pain chest pain or cough or diarrhea.  Has been taking his medications as advised.  Last time patient had seizure was more than 3 years ago as per patient's mother.  ED Course: In the ER patient was given 1.5 g of Keppra loading dose.  CT head was unremarkable.  EKG showed normal sinus rhythm.  Neurologist was consulted.  Patient admitted for further observation.  My exam patient appears to be back to baseline as per the patient's mother.  Patient does not talk much as per the patient's mother.  Also has noticed some blood around the rectal area which was from a small skin tear on the gluteal cleft.  Review of Systems: As per HPI, rest all negative.   Past Medical History:  Diagnosis Date  . Asthma   . Intellectual delay   . Seizures (HCC)     Past Surgical History:  Procedure Laterality Date  . NISSEN FUNDOPLICATION  April 2009     reports that  has never smoked. he has never used smokeless tobacco. He reports that he does not drink alcohol or use  drugs.  Allergies  Allergen Reactions  . Dilantin [Phenytoin Sodium Extended] Other (See Comments)    hallucinations  . Chloral Hydrate Hives and Rash    Family History  Problem Relation Age of Onset  . Asthma Mother   . GER disease Mother   . Thyroid disease Mother   . Thyroid disease Sister   . Leukemia Maternal Grandmother   . Diabetes Maternal Grandmother   . Heart disease Maternal Grandmother     Prior to Admission medications   Medication Sig Start Date End Date Taking? Authorizing Provider  albuterol (PROVENTIL) (5 MG/ML) 0.5% nebulizer solution Take 0.5 mLs (2.5 mg total) by nebulization every 6 (six) hours as needed for wheezing. 04/16/12   Earley Favor, NP  dicyclomine (BENTYL) 20 MG tablet Take 1 tablet (20 mg total) by mouth 2 (two) times daily. 01/29/17   Renne Crigler, PA-C  FELBATOL 600 MG tablet Take 1 tab by mouth every morning, 1/2 tab every afternoon and 1 tab every evening Patient taking differently: Take 300 mg by mouth 2 (two) times daily. Take 1 tab by mouth every morning, 1/2 tab every afternoon and 1 tab every evening 06/24/16   Elveria Rising, NP  hydrOXYzine (ATARAX/VISTARIL) 25 MG tablet Take 1 tablet (25 mg total) by mouth every 6 (six) hours. 10/09/16   Janne Napoleon, NP  KEPPRA 500 MG tablet Take 1/2 tablet twice per day  Patient taking differently: Take 250 mg by mouth daily. Take 1/2 tablet twice per day 06/24/16   Elveria Rising, NP  LAMICTAL 150 MG tablet Take 1 tab by mouth twice daily. Patient taking differently: Take 150 mg by mouth 2 (two) times daily. Take 1 tab by mouth twice daily. 06/24/16   Elveria Rising, NP  LAMICTAL 25 MG tablet Take 1 tab by mouth twice daily 06/24/16   Elveria Rising, NP  ondansetron (ZOFRAN ODT) 4 MG disintegrating tablet Take 1 tablet (4 mg total) by mouth every 8 (eight) hours as needed for nausea or vomiting. 11/07/16   Mathews Robinsons B, PA-C  polyethylene glycol powder (GLYCOLAX/MIRALAX) powder Mix 17g of  Miralax in 8 oz of water daily 05/25/16   Zehr, Shanda Bumps D, PA-C  predniSONE (DELTASONE) 5 MG tablet Take 1 tablet (5 mg total) by mouth daily. 06/24/16   Elveria Rising, NP  ranitidine (ZANTAC) 150 MG tablet Take 1 tablet (150 mg total) by mouth 2 (two) times daily. 10/09/16   Janne Napoleon, NP    Physical Exam: Vitals:   05/31/17 0115 05/31/17 0147 05/31/17 0200 05/31/17 0210  BP: 103/77  134/79 (!) 126/92  Pulse: 91  78 74  Resp:    16  Temp:  97.9 F (36.6 C)    TempSrc:      SpO2: 100%  98% 98%  Weight:      Height:          Constitutional: Moderately built and nourished. Vitals:   05/31/17 0115 05/31/17 0147 05/31/17 0200 05/31/17 0210  BP: 103/77  134/79 (!) 126/92  Pulse: 91  78 74  Resp:    16  Temp:  97.9 F (36.6 C)    TempSrc:      SpO2: 100%  98% 98%  Weight:      Height:       Eyes: Anicteric no pallor. ENMT: No discharge from the ears eyes nose or mouth. Neck: No mass felt.  No neck rigidity. Respiratory: No rhonchi or crepitations. Cardiovascular: S1-S2 heard no murmurs appreciated. Abdomen: Soft nontender bowel sounds present.  Musculoskeletal: No edema no joint effusion. Skin: No rash. Neurologic: Alert awake oriented to his name.  Recognizes mother.  Moves all extremities.  Pupils are equal and reacting to light. Psychiatric: Patient has intellectual delay.   Labs on Admission: I have personally reviewed following labs and imaging studies  CBC: Recent Labs  Lab 05/30/17 2146  WBC 9.3  HGB 17.1*  HCT 47.7  MCV 86.6  PLT 372   Basic Metabolic Panel: Recent Labs  Lab 05/30/17 2146  NA 137  K 3.6  CL 103  CO2 22  GLUCOSE 98  BUN 10  CREATININE 0.91  CALCIUM 9.3   GFR: Estimated Creatinine Clearance: 113 mL/min (by C-G formula based on SCr of 0.91 mg/dL). Liver Function Tests: Recent Labs  Lab 05/30/17 2146  AST 24  ALT 20  ALKPHOS 95  BILITOT 0.9  PROT 7.6  ALBUMIN 4.5   No results for input(s): LIPASE, AMYLASE in the  last 168 hours. No results for input(s): AMMONIA in the last 168 hours. Coagulation Profile: No results for input(s): INR, PROTIME in the last 168 hours. Cardiac Enzymes: No results for input(s): CKTOTAL, CKMB, CKMBINDEX, TROPONINI in the last 168 hours. BNP (last 3 results) No results for input(s): PROBNP in the last 8760 hours. HbA1C: No results for input(s): HGBA1C in the last 72 hours. CBG: No results for input(s): GLUCAP in the last  168 hours. Lipid Profile: No results for input(s): CHOL, HDL, LDLCALC, TRIG, CHOLHDL, LDLDIRECT in the last 72 hours. Thyroid Function Tests: No results for input(s): TSH, T4TOTAL, FREET4, T3FREE, THYROIDAB in the last 72 hours. Anemia Panel: No results for input(s): VITAMINB12, FOLATE, FERRITIN, TIBC, IRON, RETICCTPCT in the last 72 hours. Urine analysis:    Component Value Date/Time   COLORURINE YELLOW 01/28/2017 2118   APPEARANCEUR CLOUDY (A) 01/28/2017 2118   LABSPEC 1.017 01/28/2017 2118   PHURINE 7.0 01/28/2017 2118   GLUCOSEU NEGATIVE 01/28/2017 2118   HGBUR NEGATIVE 01/28/2017 2118   BILIRUBINUR NEGATIVE 01/28/2017 2118   KETONESUR NEGATIVE 01/28/2017 2118   PROTEINUR NEGATIVE 01/28/2017 2118   NITRITE NEGATIVE 01/28/2017 2118   LEUKOCYTESUR NEGATIVE 01/28/2017 2118   Sepsis Labs: @LABRCNTIP (procalcitonin:4,lacticidven:4) )No results found for this or any previous visit (from the past 240 hour(s)).   Radiological Exams on Admission: Ct Head Wo Contrast  Result Date: 05/31/2017 CLINICAL DATA:  Altered mental status.  Possible seizure. EXAM: CT HEAD WITHOUT CONTRAST TECHNIQUE: Contiguous axial images were obtained from the base of the skull through the vertex without intravenous contrast. COMPARISON:  Head CT 09/29/2005. FINDINGS: Brain: No mass lesion, intraparenchymal hemorrhage or extra-axial collection. No evidence of acute cortical infarct. Brain parenchyma and CSF-containing spaces are normal for age. Vascular: No hyperdense vessel  or unexpected calcification. Skull: Normal visualized skull base, calvarium and extracranial soft tissues. Sinuses/Orbits: No sinus fluid levels or advanced mucosal thickening. No mastoid effusion. Normal orbits. IMPRESSION: Normal head CT. Electronically Signed   By: Deatra Robinson M.D.   On: 05/31/2017 01:40    EKG: Independently reviewed.  Normal sinus rhythm.  Assessment/Plan Principal Problem:   Seizure (HCC) Active Problems:   Seizures (HCC)    1. Seizure -appreciate neurology consult.  Patient's Keppra dose has been increased to 500 twice daily after loading dose of 1.5 g.  Lamictal levels have been sent.  Patient is to be followed by patient's neurologist Dr. Sharene Skeans as outpatient.  Continue felbamate and Lamictal.  Closely observe. 2. Acute encephalopathy likely postictal state which is improved. 3. History of asthma presently not wheezing. 4. Transient amount of bleeding from the gluteal cleft which was self-limiting.  Closely observe.   DVT prophylaxis: SCDs. Code Status: Full code. Family Communication: Patient's mother. Disposition Plan: Home. Consults called: Neurology. Admission status: Inpatient.   Eduard Clos MD [facility] Pager [phone].  If 7PM-7AM, please contact night-coverage [email or web address] Password [email or web address]  05/31/2017, 2:25 AM

## 2017-05-31 NOTE — Progress Notes (Signed)
Progress Note    [name]  ZOX:[id number] DOB: 1991/05/04  DOA: 05/30/2017 PCP: Christel Mormonoccaro, Peter J, MD    Brief Narrative:   Chief complaint: Altered awareness  Medical records reviewed and are as summarized below:  [name] is an 27 y.o. male With a PMH of intellectual delay, refractory epilepsy and asthma who was admitted 05/30/17 with altered awareness concerning for seizures. She also noticed he had blood per rectum. On initial presentation, he was loaded with 1.5 g of IV Keppra and was noted to have rapid improvement.   Assessment/Plan:   Principal Problem:   Breakthrough Seizure (HCC) in a patient with known seizure disorder associated with acute encephalopathy Neurologist consulted. Improved with Keppra 1.5 g IV given on admission. Home Keppra dose subsequently increased to 500 mg twice a day. Continue lamotrigine and felbamate. Continue seizure precautions.  Active Problems:   History of asthma Stable.    Bleeding noted in the gluteal cleft Self-limiting.   Body mass index is 25.82 kg/m.   Family Communication/Anticipated D/C date and plan/Code Status   DVT prophylaxis: SCDs ordered. Code Status: Full Code.  Family Communication: Mother updated at bedside. Disposition Plan: Home later today if condition stabilized.   Medical Consultants:    Neurology   Anti-Infectives:    None  Subjective:   Patient is currently slow to respond and does not appear to be back to baseline according to his mother who is at the bedside.  Objective:    Vitals:   05/31/17 0210 05/31/17 0318 05/31/17 0358 05/31/17 0359  BP: (!) 126/92     Pulse: 74 90 (!) 105 (!) 103  Resp: 16     Temp:      TempSrc:      SpO2: 98% 100% 99% 100%  Weight:      Height:        Intake/Output Summary (Last 24 hours) at 05/31/2017 0740 Last data filed at 05/31/2017 0129 Gross per 24 hour  Intake 115 ml  Output -  Net 115 ml   Filed Weights   05/30/17 2124  Weight:  72.6 kg (160 lb)    Exam: General: No acute distress. Slow to respond. Cardiovascular: Heart sounds show a regular rate, and rhythm. No gallops or rubs. No murmurs. No JVD. Lungs: Clear to auscultation bilaterally with good air movement. No rales, rhonchi or wheezes. Abdomen: Soft, nontender, nondistended with normal active bowel sounds. No masses. No hepatosplenomegaly. Neurological: Slow to respond but cranial nerves intact. Awake and alert. Skin: Warm and dry. No rashes or lesions. Extremities: No clubbing or cyanosis. No edema. Pedal pulses 2+. Psychiatric: Mood and affect are flat. Insight and judgment appear to be impaired.   Data Reviewed:   I have personally reviewed following labs and imaging studies:  Labs: Labs show the following:   Basic Metabolic Panel: Recent Labs  Lab 05/30/17 2146 05/31/17 0308  NA 137 137  K 3.6 4.0  CL 103 103  CO2 22 22  GLUCOSE 98 101*  BUN 10 9  CREATININE 0.91 0.91  CALCIUM 9.3 9.4  MG  --  2.1   GFR Estimated Creatinine Clearance: 113 mL/min (by C-G formula based on SCr of 0.91 mg/dL). Liver Function Tests: Recent Labs  Lab 05/30/17 2146 05/31/17 0308  AST 24 25  ALT 20 18  ALKPHOS 95 91  BILITOT 0.9 0.9  PROT 7.6 7.5  ALBUMIN 4.5 4.4   CBC: Recent Labs  Lab 05/30/17 2146 05/31/17 0308  WBC 9.3 10.1  NEUTROABS  --  7.6  HGB 17.1* 16.6  HCT 47.7 46.5  MCV 86.6 86.9  PLT 372 383    Microbiology No results found for this or any previous visit (from the past 240 hour(s)).  Procedures and diagnostic studies:  Ct Head Wo Contrast 05/31/2017: My independent review of the image shows: No acute findings   Medications:   . felbamate  600 mg Oral Q12H   And  . felbamate  300 mg Oral Q24H  . lamoTRIgine  175 mg Oral BID  . levETIRAcetam  500 mg Oral BID   Continuous Infusions: . sodium chloride 75 mL/hr at 05/31/17 0257     LOS: 0 days   Hillery Aldo  [facility] Pager [phone]. If unable  to reach me by pager, please call my cell phone at [phone].  *Please refer to [email or web address], password [email or web address] to get updated schedule on who will round on this patient, as hospitalists switch teams weekly. If 7PM-7AM, please contact night-coverage at [email or web address], password [email or web address] for any overnight needs.  05/31/2017, 7:40 AM

## 2017-05-31 NOTE — Telephone Encounter (Signed)
Received a skype from [place] and I informed her to give Dr. Darnelle Catalanama the on call phone number. There was a phone note put in and it will be routed to Dr. Sharene SkeansHickling

## 2017-05-31 NOTE — Telephone Encounter (Signed)
I spoke to Dr. Darnelle Catalanama.  He was on a very low dose of Keppra (250 twice daily) increasing to 500 mg twice daily is a good idea so is getting an EEG.  Though I cannot formally read it, I will take a look at it.  He also should be sent to see me in follow-up a month after discharge from the hospital.

## 2017-05-31 NOTE — Telephone Encounter (Signed)
°  Who's calling (name and relationship to patient) : Dr. Trula Orehristina Rama  Best contact number: [phone] Provider they see: Dr. Sharene SkeansHickling Reason for call: Dr. Darnelle Catalanama called regarding pt. Pt was admitted into the hospital last night. Dr. Darnelle Catalanama would like to speak with Dr. Sharene SkeansHickling regarding this.

## 2017-05-31 NOTE — Consult Note (Signed)
Neurology Consultation Reason for Consult: Seizures, AMS Referring Physician: Ethelda Chick [facility]    History is obtained from: Mother, chart review  HPI: [name] is a 27 y.o. male with past medical history of intellectual delay, refractory epilepsy being managed by Dr. Sharene Skeans, asthma who presents with episodes of altered awareness today concerning for seizures  This afternoon the mother noticed the patient found bent down the toilet seat. She noticed that he had lots of blood per rectum and decided to bring him to the emergency room. She noticed that while she was trying to get him out of the car in front of the emergency room, he started shaking and she had to sit him down on the ground. He was assisted by nurse and was wheelchair into the emergency room waiting area. During this time he had to more episodes where he would stare off into space for about 2 minutes and is unresponsive. He is also in normally talkative but has not been talking since his arrival to the emergency room.   He was loaded with 1.5 g IV Keppra. His mental status has continued to improve and patient is now talking and following all commands. He is being admitted to hospitalist team for observation overnight given multiple events concerning for seizures, CT head was unremarkable   ROS: A 14 point ROS was performed and is negative except as noted in the HPI.  Past Medical History:  Diagnosis Date  . Asthma   . Intellectual delay   . Seizures (HCC)     Family History  Problem Relation Age of Onset  . Asthma Mother   . GER disease Mother   . Thyroid disease Mother   . Thyroid disease Sister   . Leukemia Maternal Grandmother   . Diabetes Maternal Grandmother   . Heart disease Maternal Grandmother      Social History:  reports that  has never smoked. he has never used smokeless tobacco. He reports that he does not drink alcohol or use drugs.   Exam: Current vital signs: BP (!) 126/92 (BP  Location: Left Arm)   Pulse 74   Temp 97.9 F (36.6 C)   Resp 16   Ht 5\' 6"  (1.676 m)   Wt 72.6 kg (160 lb)   SpO2 98%   BMI 25.82 kg/m  Vital signs in last 24 hours: Temp:  [97.9 F (36.6 C)-99.2 F (37.3 C)] 97.9 F (36.6 C) (01/21 0147) Pulse Rate:  [62-91] 74 (01/21 0210) Resp:  [16-18] 16 (01/21 0210) BP: (103-134)/(74-92) 126/92 (01/21 0210) SpO2:  [97 %-100 %] 98 % (01/21 0210) Weight:  [72.6 kg (160 lb)] 72.6 kg (160 lb) (01/20 2124)   Physical Exam  Constitutional: Appears well-developed and well-nourished.  Psych: Affect appropriate to situation Eyes: No scleral injection HENT: No OP obstrucion Head: Normocephalic.  Cardiovascular: Normal rate and regular rhythm.  Respiratory: Effort normal, non-labored breathing GI: Soft.  No distension. There is no tenderness.  Skin: WDI  Neuro: Mental Status: Patient is awake, alert, oriented to person, place. He speaks 1-2 words that time No signs of aphasia or neglect. Cranial Nerves: II: Visual Fields are full. Pupils are equal, round, and reactive to light.   V: Facial sensation is symmetric to temperature VII: Facial movement is symmetric.  VIII: hearing is intact to voice X: Uvula elevates symmetrically XI: Shoulder shrug is symmetric. XII: tongue is midline without atrophy or fasciculations.  Motor: Tone is normal. Bulk is normal. 5/5 strength was present in all  four extremities.  Sensory: Sensation is symmetric to light touch and temperature in the arms and legs. Deep Tendon Reflexes: 2+ and symmetric in the biceps and patellae.  Plantars: Toes are downgoing bilaterally Cerebellar: FNF and HKS are intact bilaterally.    I have reviewed labs in epic and the results pertinent to this consultation are: Most labs are pending  I have reviewed the images obtained: I review his CT head   ASSESSMENT AND PLAN [name] is a 27 y.o. male with past medical history of intellectual delay, refractory epilepsy  being managed by Dr. Sharene SkeansHickling, asthma who presents with episodes of altered awareness today concerning for seizures   Refractory Epilepsy  Breakthrough seizures   Recommendations   Load with 1.5 g of Keppra  Continue Home lamotrigine, felbamate Increase home Keppra dose 500mg   BID Obtain Lamictal level, will unlikely be back during hospitalization, but will help his neurologist in titrating medications Call Dr Sharene SkeansHickling tomorrow for any other suggestions Check for underlying infection and metabolic conditions - UA, CXR, CBC, CMP, urine drug screen No need for EEG and MRI Brain inpatient if he returns to baseline. Seizure precautions  Will follow.  Georgiana SpinnerSushanth Armoni Kludt MD [facility] [phone]  If 7pm to 7am, please call on call as listed on AMION.

## 2017-05-31 NOTE — ED Notes (Signed)
Report called  

## 2017-06-01 ENCOUNTER — Observation Stay (HOSPITAL_COMMUNITY): Payer: Medicaid Other

## 2017-06-01 DIAGNOSIS — R569 Unspecified convulsions: Secondary | ICD-10-CM | POA: Diagnosis not present

## 2017-06-01 DIAGNOSIS — G40309 Generalized idiopathic epilepsy and epileptic syndromes, not intractable, without status epilepticus: Secondary | ICD-10-CM | POA: Diagnosis not present

## 2017-06-01 DIAGNOSIS — S31809A Unspecified open wound of unspecified buttock, initial encounter: Secondary | ICD-10-CM

## 2017-06-01 LAB — GLUCOSE, CAPILLARY
GLUCOSE-CAPILLARY: 120 mg/dL — AB (ref 65–99)
GLUCOSE-CAPILLARY: 129 mg/dL — AB (ref 65–99)
Glucose-Capillary: 116 mg/dL — ABNORMAL HIGH (ref 65–99)

## 2017-06-01 MED ORDER — POLYETHYLENE GLYCOL 3350 17 G PO PACK
17.0000 g | PACK | Freq: Every day | ORAL | Status: DC
  Administered 2017-06-01: 17 g via ORAL
  Filled 2017-06-01: qty 1

## 2017-06-01 MED ORDER — HYDROXYZINE HCL 25 MG PO TABS: 25.0000 mg | ORAL_TABLET | Freq: Four times a day (QID) | ORAL | Status: DC | PRN

## 2017-06-01 MED ORDER — KEPPRA 500 MG PO TABS: ORAL_TABLET | ORAL | 5 refills | Status: DC

## 2017-06-01 MED ORDER — FELBATOL 600 MG PO TABS: 300.0000 mg | ORAL_TABLET | Freq: Two times a day (BID) | ORAL | Status: DC

## 2017-06-01 NOTE — Progress Notes (Signed)
EEG completed; results pending.    

## 2017-06-01 NOTE — Progress Notes (Signed)
Discharge instructions reviewed with patient/family. All questions answered at this time. Transport home by family.   Zorian Gunderman, RN 

## 2017-06-01 NOTE — Procedures (Signed)
ELECTROENCEPHALOGRAM REPORT  Date of Study: 06/01/2017  Patient's Name: [name] MRN: [id number] Date of Birth: 02-Jun-1990  Referring Provider: Dr. Milon DikesAshish Arora  Clinical History: This is a 27 year old man with intellectual delay, refractory epilepsy, with episodes of altered awareness.  Medications: Keppra Lamictal Felbatol  Technical Summary: A multichannel digital EEG recording measured by the international 10-20 system with electrodes applied with paste and impedances below 5000 ohms performed in our laboratory with EKG monitoring in an awake and asleep patient.  Hyperventilation and photic stimulation were performed.  The digital EEG was referentially recorded, reformatted, and digitally filtered in a variety of bipolar and referential montages for optimal display.    Description: The patient is awake and asleep during the recording.  During maximal wakefulness, there is a symmetric, medium voltage 10 Hz posterior dominant rhythm that attenuates with eye opening.  The record is symmetric.  During drowsiness and sleep, there is an increase in theta slowing of the background.  Vertex waves and symmetric sleep spindles were seen.  Hyperventilation and photic stimulation did not elicit any abnormalities.  There were no epileptiform discharges or electrographic seizures seen.    EKG lead was unremarkable.  Impression: This awake and asleep EEG is normal.    Clinical Correlation: A normal EEG does not exclude a clinical diagnosis of epilepsy. Clinical correlation is advised.   Patrcia DollyKaren Aquino, M.D.

## 2017-06-01 NOTE — Discharge Summary (Signed)
Physician Discharge Summary  [name] ZOX:[id number] DOB: [birth year] DOA: 05/30/2017  PCP: Christel Mormon, MD  Admit date: 05/30/2017 Discharge date: 06/01/2017  Admitted From: Home Discharge disposition: Home   Recommendations for Outpatient Follow-Up:   1. Patient will follow-up with his outpatient neurologist within 1 month. Please follow-up on Lamictal levels which are pending at the time of this dictation.   Discharge Diagnosis:   Principal Problem:   Seizure [facility]) Active Problems:   History of asthma   Self-limited bleeding in the gluteal cleft area   Constipation   Discharge Condition: Improved.  Diet recommendation: Regular.  CODE STATUS: Full   History of Present Illness:   [name] is an 27 y.o. male With a PMH of intellectual delay, refractory epilepsy and asthma who was admitted 05/30/17 with altered awareness concerning for seizures. She also noticed he had blood per rectum. On initial presentation, he was loaded with 1.5 g of IV Keppra and was noted to have rapid improvement.   Hospital Course by Problem:   Principal Problem:   Breakthrough Seizure (HCC) in a patient with known seizure disorder associated with acute encephalopathy Neurologist consulted. Improved with Keppra 1.5 g IV given on admission. Spoke with the patient's outpatient neurologist, Dr. Sharene Skeans 05/31/17 who recommended repeating an EEG.  EEG unremarkable with no evidence of seizure activity. Continue increased dose Keppra, lamotrigine and felbamate.  Follow-up with Dr. Sharene Skeans one month post discharge.  Active Problems:   History of asthma Stable.    Bleeding noted in the gluteal cleft Self-limiting.    Constipation Continue MiraLAX as needed.   Medical Consultants:    Neurology   Discharge Exam:   Vitals:   06/01/17 0200 06/01/17 0621  BP: (!) 120/58 118/63  Pulse: 78 89  Resp: 18 18  Temp: 97.9 F (36.6 C) 99 F (37.2 C)  SpO2: 100% 100%   Vitals:    05/31/17 1740 05/31/17 2151 06/01/17 0200 06/01/17 0621  BP: 122/62 123/73 (!) 120/58 118/63  Pulse: 99 81 78 89  Resp: 18 18 18 18   Temp: 98.7 F (37.1 C) 98.9 F (37.2 C) 97.9 F (36.6 C) 99 F (37.2 C)  TempSrc: Oral Oral Oral Oral  SpO2: 99% 100% 100% 100%  Weight:      Height:        General exam: Appears calm and comfortable.  Respiratory system: Clear to auscultation. Respiratory effort normal. Cardiovascular system: S1 & S2 heard, RRR. No JVD,  rubs, gallops or clicks. No murmurs. Gastrointestinal system: Abdomen is nondistended, soft and nontender. No organomegaly or masses felt. Normal bowel sounds heard. Central nervous system: Alert but mostly nonverbal. No focal neurological deficits. Extremities: No clubbing,  or cyanosis. No edema. Skin: No rashes, lesions or ulcers. Psychiatry: Judgement and insight appear impaired. Mood & affect appropriate.    The results of significant diagnostics from this hospitalization (including imaging, microbiology, ancillary and laboratory) are listed below for reference.     Procedures and Diagnostic Studies:   Ct Head Wo Contrast  Result Date: 05/31/2017 CLINICAL DATA:  Altered mental status.  Possible seizure. EXAM: CT HEAD WITHOUT CONTRAST TECHNIQUE: Contiguous axial images were obtained from the base of the skull through the vertex without intravenous contrast. COMPARISON:  Head CT 09/29/2005. FINDINGS: Brain: No mass lesion, intraparenchymal hemorrhage or extra-axial collection. No evidence of acute cortical infarct. Brain parenchyma and CSF-containing spaces are normal for age. Vascular: No hyperdense vessel or unexpected calcification. Skull: Normal visualized skull base, calvarium and extracranial  soft tissues. Sinuses/Orbits: No sinus fluid levels or advanced mucosal thickening. No mastoid effusion. Normal orbits. IMPRESSION: Normal head CT. Electronically Signed   By: Deatra RobinsonKevin  Herman M.D.   On: 05/31/2017 01:40     Labs:    Basic Metabolic Panel: Recent Labs  Lab 05/30/17 2146 05/31/17 0308  NA 137 137  K 3.6 4.0  CL 103 103  CO2 22 22  GLUCOSE 98 101*  BUN 10 9  CREATININE 0.91 0.91  CALCIUM 9.3 9.4  MG  --  2.1   GFR Estimated Creatinine Clearance: 113 mL/min (by C-G formula based on SCr of 0.91 mg/dL). Liver Function Tests: Recent Labs  Lab 05/30/17 2146 05/31/17 0308  AST 24 25  ALT 20 18  ALKPHOS 95 91  BILITOT 0.9 0.9  PROT 7.6 7.5  ALBUMIN 4.5 4.4   CBC: Recent Labs  Lab 05/30/17 2146 05/31/17 0308  WBC 9.3 10.1  NEUTROABS  --  7.6  HGB 17.1* 16.6  HCT 47.7 46.5  MCV 86.6 86.9  PLT 372 383   CBG: Recent Labs  Lab 05/31/17 1240 05/31/17 1751 06/01/17 0013 06/01/17 0618 06/01/17 1216  GLUCAP 162* 118* 116* 120* 129*     Discharge Instructions:   Discharge Instructions    Call MD for:   Complete by:  As directed    Recurrent seizures.   Diet general   Complete by:  As directed    Discharge instructions   Complete by:  As directed    Your Keppra dose has been increased to 500 mg twice a day.  Dr. Sharene SkeansHickling wants to see you in follow up within 1 month. Call his office if you continue to have staring spells or other signs of seizure activity. Your EEG looked OK, with no seizures seen.   Increase activity slowly   Complete by:  As directed      Allergies as of 06/01/2017      Reactions   Dilantin [phenytoin Sodium Extended] Other (See Comments)   hallucinations   Chloral Hydrate Hives, Rash      Medication List    TAKE these medications   albuterol (5 MG/ML) 0.5% nebulizer solution Commonly known as:  PROVENTIL Take 0.5 mLs (2.5 mg total) by nebulization every 6 (six) hours as needed for wheezing.   FELBATOL 600 MG tablet Generic drug:  felbamate Take 0.5 tablets (300 mg total) by mouth 2 (two) times daily. Take 1 tab by mouth every morning, 1/2 tab every afternoon and 1 tab every evening What changed:    how much to take  how to take  this  when to take this   hydrOXYzine 25 MG tablet Commonly known as:  ATARAX/VISTARIL Take 1 tablet (25 mg total) by mouth every 6 (six) hours as needed for anxiety or itching.   KEPPRA 500 MG tablet Generic drug:  levETIRAcetam Increase to 500 mg BID What changed:  additional instructions   LAMICTAL 150 MG tablet Generic drug:  lamoTRIgine Take 1 tab by mouth twice daily. What changed:    how much to take  how to take this  when to take this  additional instructions   LAMICTAL 25 MG tablet Generic drug:  lamoTRIgine Take 1 tab by mouth twice daily What changed:  Another medication with the same name was changed. Make sure you understand how and when to take each.   ondansetron 4 MG disintegrating tablet Commonly known as:  ZOFRAN ODT Take 1 tablet (4 mg total) by mouth every  8 (eight) hours as needed for nausea or vomiting.   polyethylene glycol powder powder Commonly known as:  GLYCOLAX/MIRALAX Mix 17g of Miralax in 8 oz of water daily What changed:    how much to take  how to take this  when to take this  reasons to take this  additional instructions   predniSONE 5 MG tablet Commonly known as:  DELTASONE Take 1 tablet (5 mg total) by mouth daily.      Follow-up Information    Deetta Perla, MD. Schedule an appointment as soon as possible for a visit in 1 month(s).   Specialties:  Pediatrics, Radiology Contact information: [address] [place] [address] [phone]            Time coordinating discharge: 35 minutes.  SignedTrula Ore Rama  Pager [phone] [facility] 06/01/2017, 1:17 PM

## 2017-06-02 LAB — LAMOTRIGINE LEVEL: Lamotrigine Lvl: 1 ug/mL — ABNORMAL LOW (ref 2.0–20.0)

## 2017-07-21 ENCOUNTER — Encounter (HOSPITAL_COMMUNITY): Payer: Self-pay | Admitting: Emergency Medicine

## 2017-07-21 ENCOUNTER — Other Ambulatory Visit: Payer: Self-pay

## 2017-07-21 DIAGNOSIS — K5900 Constipation, unspecified: Secondary | ICD-10-CM | POA: Diagnosis not present

## 2017-07-21 DIAGNOSIS — R569 Unspecified convulsions: Secondary | ICD-10-CM | POA: Diagnosis present

## 2017-07-21 DIAGNOSIS — Z5321 Procedure and treatment not carried out due to patient leaving prior to being seen by health care provider: Secondary | ICD-10-CM | POA: Diagnosis not present

## 2017-07-21 LAB — COMPREHENSIVE METABOLIC PANEL
ALT: 19 U/L (ref 17–63)
ANION GAP: 11 (ref 5–15)
AST: 23 U/L (ref 15–41)
Albumin: 4.2 g/dL (ref 3.5–5.0)
Alkaline Phosphatase: 86 U/L (ref 38–126)
BUN: 9 mg/dL (ref 6–20)
CHLORIDE: 103 mmol/L (ref 101–111)
CO2: 24 mmol/L (ref 22–32)
CREATININE: 0.99 mg/dL (ref 0.61–1.24)
Calcium: 9.2 mg/dL (ref 8.9–10.3)
Glucose, Bld: 92 mg/dL (ref 65–99)
Potassium: 3.6 mmol/L (ref 3.5–5.1)
SODIUM: 138 mmol/L (ref 135–145)
Total Bilirubin: 0.6 mg/dL (ref 0.3–1.2)
Total Protein: 6.6 g/dL (ref 6.5–8.1)

## 2017-07-21 LAB — CBC
HCT: 46.7 % (ref 39.0–52.0)
HEMOGLOBIN: 16.3 g/dL (ref 13.0–17.0)
MCH: 31 pg (ref 26.0–34.0)
MCHC: 34.9 g/dL (ref 30.0–36.0)
MCV: 88.8 fL (ref 78.0–100.0)
PLATELETS: 351 10*3/uL (ref 150–400)
RBC: 5.26 MIL/uL (ref 4.22–5.81)
RDW: 13 % (ref 11.5–15.5)
WBC: 10.6 10*3/uL — AB (ref 4.0–10.5)

## 2017-07-21 LAB — TYPE AND SCREEN
ABO/RH(D): O NEG
Antibody Screen: NEGATIVE

## 2017-07-21 NOTE — ED Triage Notes (Signed)
Pt's mother states pt was recently admitted for a wound to his bottom and seizure like activity. Hx seizures, mom states wound continues to bleed and has gotten larger. Also states pt has been constipated.

## 2017-07-22 ENCOUNTER — Emergency Department (HOSPITAL_COMMUNITY)
Admission: EM | Admit: 2017-07-22 | Discharge: 2017-07-22 | Disposition: A | Discharge status: Home / Self Care | Payer: Medicaid Other | Attending: Emergency Medicine | Admitting: Emergency Medicine

## 2017-07-22 NOTE — ED Notes (Signed)
Pt states that they are leaving due to wait times and [facility]

## 2017-08-12 ENCOUNTER — Ambulatory Visit (INDEPENDENT_AMBULATORY_CARE_PROVIDER_SITE_OTHER): Payer: Medicaid Other

## 2017-08-12 ENCOUNTER — Ambulatory Visit (HOSPITAL_COMMUNITY): Payer: Medicaid Other

## 2017-08-12 ENCOUNTER — Encounter (HOSPITAL_COMMUNITY): Payer: Self-pay | Admitting: Emergency Medicine

## 2017-08-12 ENCOUNTER — Ambulatory Visit (HOSPITAL_COMMUNITY)
Admission: EM | Admit: 2017-08-12 | Discharge: 2017-08-12 | Disposition: A | Discharge status: Home / Self Care | Payer: Medicaid Other | Attending: Family Medicine | Admitting: Family Medicine

## 2017-08-12 DIAGNOSIS — Z5189 Encounter for other specified aftercare: Secondary | ICD-10-CM

## 2017-08-12 DIAGNOSIS — S93491A Sprain of other ligament of right ankle, initial encounter: Secondary | ICD-10-CM | POA: Diagnosis not present

## 2017-08-12 MED ORDER — SULFAMETHOXAZOLE-TRIMETHOPRIM 800-160 MG PO TABS: 1.0000 | ORAL_TABLET | Freq: Two times a day (BID) | ORAL | 0 refills | Status: AC

## 2017-08-12 NOTE — ED Triage Notes (Signed)
Pt was at the park yesterday, he fell while playing basketball, pt c/o R ankle pain. Pt also went to the ER a month ago, pt has a sore on his buttocks. Pt sore has not healed and states its still hurting. Every time he uses the bathroom its painful.

## 2017-08-17 NOTE — ED Provider Notes (Signed)
[facility]   [id number] 08/12/17 Arrival Time: 1929  ASSESSMENT & PLAN:  1. Sprain of anterior talofibular ligament of right ankle, initial encounter   2. Visit for wound check    Imaging: Dg Ankle Complete Right  Result Date: 08/12/2017 CLINICAL DATA:  Twisted the ankle EXAM: RIGHT ANKLE - COMPLETE 3+ VIEW COMPARISON:  None. FINDINGS: No fracture or malalignment. Ankle mortise is symmetric. Lateral soft tissue swelling. Os versus small loose body adjacent to the fibular malleolar tip. IMPRESSION: Lateral soft tissue swelling.  No acute osseous abnormality. Electronically Signed   By: Jasmine Pang M.D.   On: 08/12/2017 20:05   Meds ordered this encounter  Medications  . sulfamethoxazole-trimethoprim (BACTRIM DS,SEPTRA DS) 800-160 MG tablet    Sig: Take 1 tablet by mouth 2 (two) times daily for 10 days.    Dispense:  20 tablet    Refill:  0   Natural history and expected course discussed. Questions answered. Rest, ice, compression, elevation (RICE) therapy. Fit with ankle brace for use over next 1 week.  Simple would care for healing sore on R buttock. No infection seen. Observe.  Will plan f/u with PCP in 1 week.  Reviewed expectations re: course of current medical issues. Questions answered. Outlined signs and symptoms indicating need for more acute intervention. Patient verbalized understanding. After Visit Summary given.  SUBJECTIVE: History from: patient. [name] is a 27 y.o. male who reports twisting his R ankle while playing basketball yesterday. Able to continue game. Lateral ankle still sore today. Mild swelling. No bruising. Continues to bear weight with slight discomfort. No extremity sensation changes or weakness. No OTC analgesics tried.  Also reports "small sore" on R buttock. Present for the past 2-3 weeks and improving. "Just wanted to get it checked."  ROS: As per HPI.   OBJECTIVE:  Vitals:   08/12/17 1937  BP: 136/69  Pulse: 68    Resp: 18  Temp: 98.4 F (36.9 C)  SpO2: 98%    General appearance: alert; no distress Extremities: no cyanosis or edema; symmetrical with no gross deformities; localized tenderness over his right lateral ankle with mild swelling and no bruising; ROM: normal with discomfort CV: normal extremity capillary refill Skin: warm and dry; over inferior R buttock there is a 1cm healing abrasion; no erythema or crusting; no areas of fluctuance Neurologic: normal gait; normal symmetric reflexes in all extremities; normal sensation in all extremities Psychological: alert and cooperative; normal mood and affect  Allergies  Allergen Reactions  . Dilantin [Phenytoin Sodium Extended] Other (See Comments)    hallucinations  . Chloral Hydrate Hives and Rash    Past Medical History:  Diagnosis Date  . Asthma   . Intellectual delay   . Seizures (HCC)    Social History   Socioeconomic History  . Marital status: Single    Spouse name: Not on file  . Number of children: Not on file  . Years of education: Not on file  . Highest education level: Not on file  Occupational History  . Not on file  Social Needs  . Financial resource strain: Not on file  . Food insecurity:    Worry: Not on file    Inability: Not on file  . Transportation needs:    Medical: Not on file    Non-medical: Not on file  Tobacco Use  . Smoking status: Never Smoker  . Smokeless tobacco: Never Used  Substance and Sexual Activity  . Alcohol use: No  Alcohol/week: 0.0 oz  . Drug use: No  . Sexual activity: Never  Lifestyle  . Physical activity:    Days per week: Not on file    Minutes per session: Not on file  . Stress: Not on file  Relationships  . Social connections:    Talks on phone: Not on file    Gets together: Not on file    Attends religious service: Not on file    Active member of club or organization: Not on file    Attends meetings of clubs or organizations: Not on file    Relationship status: Not  on file  . Intimate partner violence:    Fear of current or ex partner: Not on file    Emotionally abused: Not on file    Physically abused: Not on file    Forced sexual activity: Not on file  Other Topics Concern  . Not on file  Social History Narrative   [name] attends [employer] three days a week. He works one day at [employer]. He lives with his mother and mother's boyfriend. He enjoys basketball, riding his bike, and watching wrestling.    Family History  Problem Relation Age of Onset  . Asthma Mother   . GER disease Mother   . Thyroid disease Mother   . Thyroid disease Sister   . Leukemia Maternal Grandmother   . Diabetes Maternal Grandmother   . Heart disease Maternal Grandmother    Past Surgical History:  Procedure Laterality Date  . NISSEN FUNDOPLICATION  April 2009      Mardella LaymanHagler, Hilari Wethington, MD 08/17/17 [phone]

## 2017-10-27 ENCOUNTER — Other Ambulatory Visit (INDEPENDENT_AMBULATORY_CARE_PROVIDER_SITE_OTHER): Payer: Self-pay | Admitting: Family

## 2017-10-27 DIAGNOSIS — G40309 Generalized idiopathic epilepsy and epileptic syndromes, not intractable, without status epilepticus: Secondary | ICD-10-CM

## 2017-10-27 NOTE — Telephone Encounter (Signed)
This patient has not been seen since last year in February. How would you like to proceed?

## 2017-10-28 ENCOUNTER — Telehealth (INDEPENDENT_AMBULATORY_CARE_PROVIDER_SITE_OTHER): Payer: Self-pay | Admitting: Family

## 2017-10-28 MED ORDER — LAMICTAL 25 MG PO TABS: ORAL_TABLET | ORAL | 0 refills | Status: DC

## 2017-10-28 MED ORDER — PREDNISONE 5 MG PO TABS: 5.0000 mg | ORAL_TABLET | Freq: Every day | ORAL | 0 refills | Status: DC

## 2017-10-28 MED ORDER — LAMICTAL 150 MG PO TABS: ORAL_TABLET | ORAL | 0 refills | Status: DC

## 2017-10-28 MED ORDER — FELBATOL 600 MG PO TABS: 300.0000 mg | ORAL_TABLET | Freq: Two times a day (BID) | ORAL | 0 refills | Status: DC

## 2017-10-28 NOTE — Telephone Encounter (Signed)
Patient needs an appointment with Elveria Risingina Goodpasture on a day Dr. Sharene SkeansHickling is in the office. I left a message for patient advising to call and schedule. Rufina FalcoEmily M Hull

## 2017-11-02 NOTE — Telephone Encounter (Signed)
Received voicemail from patients mother returning our call to schedule appointment. I attempted to reach out to her once more but I got no answer.

## 2017-11-09 ENCOUNTER — Ambulatory Visit (INDEPENDENT_AMBULATORY_CARE_PROVIDER_SITE_OTHER): Payer: Self-pay | Admitting: Family

## 2017-11-23 ENCOUNTER — Telehealth (INDEPENDENT_AMBULATORY_CARE_PROVIDER_SITE_OTHER): Payer: Self-pay | Admitting: Family

## 2017-11-23 NOTE — Telephone Encounter (Signed)
°  Who's calling (name and relationship to patient) : Aram BeechamCynthia (Mother) Best contact number: [phone] Provider they see: Elveria Risingina Goodpasture Reason for call: Mom lvm at 10:25am to rs pt's appt. I placed call at 10:35am to rs. No ans and no vm.

## 2017-12-06 ENCOUNTER — Emergency Department (HOSPITAL_COMMUNITY): Payer: Medicaid Other

## 2017-12-06 ENCOUNTER — Emergency Department (HOSPITAL_COMMUNITY)
Admission: EM | Admit: 2017-12-06 | Discharge: 2017-12-06 | Disposition: A | Discharge status: Home / Self Care | Payer: Medicaid Other | Attending: Emergency Medicine | Admitting: Emergency Medicine

## 2017-12-06 ENCOUNTER — Ambulatory Visit (INDEPENDENT_AMBULATORY_CARE_PROVIDER_SITE_OTHER): Payer: Medicaid Other | Admitting: Family

## 2017-12-06 ENCOUNTER — Encounter (HOSPITAL_COMMUNITY): Payer: Self-pay

## 2017-12-06 ENCOUNTER — Other Ambulatory Visit: Payer: Self-pay

## 2017-12-06 DIAGNOSIS — Y939 Activity, unspecified: Secondary | ICD-10-CM | POA: Insufficient documentation

## 2017-12-06 DIAGNOSIS — Z23 Encounter for immunization: Secondary | ICD-10-CM | POA: Insufficient documentation

## 2017-12-06 DIAGNOSIS — W230XXA Caught, crushed, jammed, or pinched between moving objects, initial encounter: Secondary | ICD-10-CM | POA: Insufficient documentation

## 2017-12-06 DIAGNOSIS — S62636B Displaced fracture of distal phalanx of right little finger, initial encounter for open fracture: Secondary | ICD-10-CM | POA: Diagnosis not present

## 2017-12-06 DIAGNOSIS — J45909 Unspecified asthma, uncomplicated: Secondary | ICD-10-CM | POA: Insufficient documentation

## 2017-12-06 DIAGNOSIS — Y990 Civilian activity done for income or pay: Secondary | ICD-10-CM | POA: Diagnosis not present

## 2017-12-06 DIAGNOSIS — S62639B Displaced fracture of distal phalanx of unspecified finger, initial encounter for open fracture: Secondary | ICD-10-CM

## 2017-12-06 DIAGNOSIS — Z79899 Other long term (current) drug therapy: Secondary | ICD-10-CM | POA: Diagnosis not present

## 2017-12-06 DIAGNOSIS — Y92210 Daycare center as the place of occurrence of the external cause: Secondary | ICD-10-CM | POA: Diagnosis not present

## 2017-12-06 DIAGNOSIS — S6991XA Unspecified injury of right wrist, hand and finger(s), initial encounter: Secondary | ICD-10-CM | POA: Diagnosis present

## 2017-12-06 LAB — CBC WITH DIFFERENTIAL/PLATELET
BASOS ABS: 0.1 10*3/uL (ref 0.0–0.1)
BASOS PCT: 1 %
EOS ABS: 0 10*3/uL (ref 0.0–0.7)
Eosinophils Relative: 0 %
HCT: 46.3 % (ref 39.0–52.0)
HEMOGLOBIN: 16.7 g/dL (ref 13.0–17.0)
Lymphocytes Relative: 16 %
Lymphs Abs: 1.5 10*3/uL (ref 0.7–4.0)
MCH: 31.7 pg (ref 26.0–34.0)
MCHC: 36.1 g/dL — AB (ref 30.0–36.0)
MCV: 87.9 fL (ref 78.0–100.0)
MONOS PCT: 4 %
Monocytes Absolute: 0.4 10*3/uL (ref 0.1–1.0)
NEUTROS PCT: 79 %
Neutro Abs: 7.9 10*3/uL — ABNORMAL HIGH (ref 1.7–7.7)
Platelets: 357 10*3/uL (ref 150–400)
RBC: 5.27 MIL/uL (ref 4.22–5.81)
RDW: 12.6 % (ref 11.5–15.5)
WBC: 9.9 10*3/uL (ref 4.0–10.5)

## 2017-12-06 LAB — BASIC METABOLIC PANEL
ANION GAP: 11 (ref 5–15)
BUN: 13 mg/dL (ref 6–20)
CALCIUM: 9.5 mg/dL (ref 8.9–10.3)
CO2: 26 mmol/L (ref 22–32)
CREATININE: 0.89 mg/dL (ref 0.61–1.24)
Chloride: 103 mmol/L (ref 98–111)
Glucose, Bld: 102 mg/dL — ABNORMAL HIGH (ref 70–99)
Potassium: 4.3 mmol/L (ref 3.5–5.1)
SODIUM: 140 mmol/L (ref 135–145)

## 2017-12-06 MED ORDER — BUPIVACAINE HCL 0.25 % IJ SOLN
10.0000 mL | Freq: Once | INTRAMUSCULAR | Status: DC
  Filled 2017-12-06: qty 10

## 2017-12-06 MED ORDER — LIDOCAINE HCL (PF) 1 % IJ SOLN
15.0000 mL | Freq: Once | INTRAMUSCULAR | Status: AC
  Administered 2017-12-06: 15 mL
  Filled 2017-12-06: qty 30

## 2017-12-06 MED ORDER — MORPHINE SULFATE (PF) 4 MG/ML IV SOLN
4.0000 mg | Freq: Once | INTRAVENOUS | Status: AC
  Administered 2017-12-06: 4 mg via INTRAVENOUS
  Filled 2017-12-06: qty 1

## 2017-12-06 MED ORDER — BUPIVACAINE HCL 0.25 % IJ SOLN
10.0000 mL | Freq: Once | INTRAMUSCULAR | Status: AC
  Administered 2017-12-06: 10 mL
  Filled 2017-12-06: qty 10

## 2017-12-06 MED ORDER — HYDROCODONE-ACETAMINOPHEN 5-325 MG PO TABS: 1.0000 | ORAL_TABLET | Freq: Four times a day (QID) | ORAL | 0 refills | Status: DC | PRN

## 2017-12-06 MED ORDER — TETANUS-DIPHTH-ACELL PERTUSSIS 5-2.5-18.5 LF-MCG/0.5 IM SUSP
0.5000 mL | Freq: Once | INTRAMUSCULAR | Status: AC
  Administered 2017-12-06: 0.5 mL via INTRAMUSCULAR
  Filled 2017-12-06: qty 0.5

## 2017-12-06 MED ORDER — CEFAZOLIN SODIUM-DEXTROSE 1-4 GM/50ML-% IV SOLN
1.0000 g | Freq: Once | INTRAVENOUS | Status: AC
  Administered 2017-12-06: 1 g via INTRAVENOUS
  Filled 2017-12-06: qty 50

## 2017-12-06 MED ORDER — CEPHALEXIN 500 MG PO CAPS: 500.0000 mg | ORAL_CAPSULE | Freq: Four times a day (QID) | ORAL | 0 refills | Status: DC

## 2017-12-06 NOTE — ED Notes (Signed)
Bed: WA08 Expected date:  Expected time:  Means of arrival:  Comments: FT

## 2017-12-06 NOTE — ED Notes (Signed)
Bed: WTR8 Expected date:  Expected time:  Means of arrival:  Comments: 

## 2017-12-06 NOTE — ED Provider Notes (Signed)
MSE was initiated and I personally evaluated the patient and placed orders (if any) at  12:53 PM on December 06, 2017.  The patient appears stable so that the remainder of the MSE may be completed by another provider.  Patient presents today for evaluation of a right pinky finger incident.  Patient has a history of developmental delays, is minimally verbal with a history of seizures.  Shortly prior to arrival he was at his day program and his finger was shut in the door.  X-rays were obtained showing a distal tuft fracture.  Physical exam shows that the nail is broken, along with generalized oozing from the area.  Patient unable to tolerate further exam.  Mother who is patient's legal guardian is present, reports that in the past when he has required any sort of similar procedure he requires some sort of sedating medication due to anxiety and inability to tolerate the procedure.  She reports that in the past he has had seizures that required admission that she feels were triggered by fear and pain.  Given this history, and probable need for sedation I spoke with patient's RN who will request that patient be moved to the back as sedation in the fast track setting is less than ideal.   Cristina GongHammond, Ahsley Attwood W, PA-C 12/06/17 1256    Gerhard MunchLockwood, Robert, MD 12/06/17 1547

## 2017-12-06 NOTE — Discharge Instructions (Signed)
Please see the information and instructions below regarding your visit.  Your diagnoses today include:  1. Open fracture of tuft of distal phalanx of finger      Tests performed today include: X-ray of the affected area that did not show any foreign bodies but he does have a broken bone at the tip of the finger. Vital signs. See below for your results today.   Medications prescribed:   Take any prescribed medications only as directed.   You have been prescribed Norco for pain. This is an opioid pain medication. You may take this medication every 4-6 hours as needed for pain. Only take this medication if you need it for breakthrough pain. You may combine this medicine with ibuprofen, a non-steroidal anti-inflammatory drug (NSAID) every 6 hours, so you are getting something for pain relief every 3 hours.  Do not combine this medication with Tylenol, as it may increase the risk of liver problems.  Do not combine this medication with alcohol.  Please be advised to avoid driving or operating heavy machinery while taking this medication, as it may make you drowsy or impair judgment.    Home care instructions:  Follow any educational materials and wound care instructions contained in this packet.   Keep affected area above the level of your heart when possible to minimize swelling. Wash area gently twice a day with warm soapy water. Do not apply alcohol or hydrogen peroxide directly over a wound. Cover the area if it is draining or weeping. Keep the bandage in place for 24 hours and refrain from getting the wound wet for 24 hours. After that, you may get the area wet, but please ensure that you dry it completely afterwards.  Please refrain from soaking sutures for long periods of time, or swimming in chlorinated water until cleared by hand surgery.  In 48 hours, you may remove the bandage and reapply bandage.  Please apply the dressing material that I provided, including the yellow gauze  first, followed by the white, non-sticky gauze pad, followed by the white wrap.  He may then place the splint on top of this wrap.  Follow-up instructions: He will follow-up with Dr. Jena GaussHaddix in 1 week.  Please call his office first thing tomorrow to make an appointment.  Return instructions:  Return to the Emergency Department if you have: Fever Worsening pain Worsening swelling of the wound Pus draining from the wound Redness of the skin that moves away from the wound, especially if it streaks away from the affected area  Any other emergent concerns  Your vital signs today were: BP 123/76    Pulse 63    Temp 98.5 F (36.9 C) (Oral)    Resp 16    Wt 72.6 kg (160 lb)    SpO2 99%    BMI 25.82 kg/m  If your blood pressure (BP) was elevated on multiple readings during this visit above 130 for the top number or above 80 for the bottom number, please have this repeated by your primary care provider within one month. --------------  Thank you for allowing us to participate in your care today! It was a pleasure taking care of you.

## 2017-12-06 NOTE — ED Provider Notes (Signed)
[facility]-EMERGENCY DEPT Provider Note   CSN: [id number] Arrival date & time: 12/06/17  1057     History   Chief Complaint Chief Complaint  Patient presents with  . Finger Injury    HPI [name] is a 27 y.o. male.  HPI   Patient is a 27 year old male with history of asthma, epilepsy, and intellectual disability presenting for right fifth digit injury.  Patient presents with his mother who assists in history taking, as patient is minimally verbal.  Patient's mother reports that [facility], when his finger was closed in a door.  EMS was called to the scene, and the bleeding was controlled with compression.  Patient at this point reporting that he is having a "throbbing" pain, but is unable to provide any further history.  Patient's mother is unsure if patient's tetanus shot is up-to-date.  Patient's mother does note that for any kind of procedures that need to be done, patient would require sedation.  Past Medical History:  Diagnosis Date  . Asthma   . Intellectual delay   . Seizures [facility](HCC)     Patient Active Problem List   Diagnosis Date Noted  . Seizure (HCC) 05/31/2017  . Seizures (HCC) 05/31/2017  . Constipation 05/25/2016  . Generalized abdominal pain 05/25/2016  . Acne 08/02/2013  . 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 Abnormality of gait 11/17/2012  . Moderate intellectual disabilities 11/17/2012  . Developmental delay disorder 11/17/2012    Past Surgical History:  Procedure Laterality Date  . NISSEN FUNDOPLICATION  April 2009        Home Medications    Prior to Admission medications   Medication Sig Start Date End Date Taking? Authorizing Provider  FELBATOL 600 MG tablet Take 0.5 tablets (300 mg total) by mouth 2 (two) times daily. Take 1 tab by mouth every morning, 1/2 tab every afternoon and 1 tab every evening 10/28/17   Yes Elveria RisingGoodpasture, Tina, NP  hydrOXYzine (ATARAX/VISTARIL) 25 MG tablet Take 1 tablet (25 mg total) by mouth every 6 (six) hours as needed for anxiety or itching. 06/01/17  Yes Rama, Maryruth Bunhristina P, MD  KEPPRA 500 MG tablet Increase to 500 mg BID Patient taking differently: 600 mg. Increase to 600 mg BID 06/01/17  Yes Rama, Maryruth Bunhristina P, MD  LAMICTAL 150 MG tablet Take 1 tab by mouth twice daily. 10/28/17  Yes Elveria RisingGoodpasture, Tina, NP  LAMICTAL 25 MG tablet Take 1 tab by mouth twice daily 10/28/17  Yes Goodpasture, Inetta Fermoina, NP  predniSONE (DELTASONE) 5 MG tablet Take 1 tablet (5 mg total) by mouth daily. 10/28/17  Yes Elveria RisingGoodpasture, Tina, NP  albuterol (PROVENTIL) (5 MG/ML) 0.5% nebulizer solution Take 0.5 mLs (2.5 mg total) by nebulization every 6 (six) hours as needed for wheezing. Patient not taking: Reported on 12/06/2017 04/16/12   Earley FavorSchulz, Gail, NP  ondansetron (ZOFRAN ODT) 4 MG disintegrating tablet Take 1 tablet (4 mg total) by mouth every 8 (eight) hours as needed for nausea or vomiting. Patient not taking: Reported on 12/06/2017 11/07/16   Mathews RobinsonsMitchell, Jessica B, PA-C  polyethylene glycol powder (GLYCOLAX/MIRALAX) powder Mix 17g of Miralax in 8 oz of water daily Patient not taking: Reported on 12/06/2017 05/25/16   Zehr, Princella PellegriniJessica D, PA-C    Family History Family History  Problem Relation Age of Onset  . Asthma Mother   . GER disease Mother   . Thyroid disease Mother   . Thyroid  disease Sister   . Leukemia Maternal Grandmother   . Diabetes Maternal Grandmother   . Heart disease Maternal Grandmother     Social History Social History   Tobacco Use  . Smoking status: Never Smoker  . Smokeless tobacco: Never Used  Substance Use Topics  . Alcohol use: No    Alcohol/week: 0.0 oz  . Drug use: No     Allergies   Dilantin [phenytoin sodium extended] and Chloral hydrate   Review of Systems Review of Systems  Musculoskeletal: Positive for arthralgias.  Skin: Positive for color change and wound.     Level 5 caveat nonverbal.  Physical Exam Updated Vital Signs BP 132/79   Pulse 68   Temp 98.5 F (36.9 C) (Oral)   Resp 16   Wt 72.6 kg (160 lb)   SpO2 99%   BMI 25.82 kg/m   Physical Exam  Constitutional: He appears well-developed and well-nourished. No distress.  Sitting comfortably in bed.  HENT:  Head: Normocephalic and atraumatic.  Eyes: Conjunctivae are normal. Right eye exhibits no discharge. Left eye exhibits no discharge.  EOMs normal to gross examination.  Neck: Normal range of motion.  Cardiovascular: Normal rate and regular rhythm.  Intact, 2+ radial and ulnar pulse of RUE.  Pulmonary/Chest:  Normal respiratory effort. Patient converses comfortably. No audible wheeze or stridor.  Abdominal: He exhibits no distension.  Musculoskeletal:  Right Hand Exam:  Inspection: See clinical photo for details.  Patient has obvious deformity of the tuft of the right fifth digit, with involvement of the nailbed.   Palpation: No MCP or DIP joint tenderness of all digits of the right hand. ROM: Passive/active ROM intact at wrist, MCP, PIP, and DIP joints, thumb MCP and IP joints, and no rotational deformity of metacarpals noted. Ligamentous stability: No laxity to valgus/varus stress of MCP, PIP, or DIP joints. No joint laxity with radial stress of thumb. Flexor/Extensor tendons: FDS/FDP tendons intact in digits 2-5 at PIP/DIP joints, respectively; extensor tendons intact in all digits Nerve testing:  Sensation intact to light touch in the distal tip of digits 1 through 4 Vascular: 2+ radial and ulnar pulses. Capillary refill <2 seconds b/l.   Neurological: He is alert.  Cranial nerves intact to gross observation. Patient moves extremities without difficulty.  Skin: Skin is warm and dry. He is not diaphoretic.  Psychiatric: He has a normal mood and affect. His behavior is normal. Judgment and thought content normal.  Nursing note and vitals reviewed.        ED  Treatments / Results  Labs (all labs ordered are listed, but only abnormal results are displayed) Labs Reviewed - No data to display  EKG None  Radiology Dg Finger Little Right  Result Date: 12/06/2017 CLINICAL DATA:  Crush injury in car door of right fifth finger. EXAM: RIGHT LITTLE FINGER 2+V COMPARISON:  Radiographs of October 14, 2016. FINDINGS: Moderately displaced fracture is seen involving the distal tuft of the fifth distal phalanx. No foreign body is noted. Joint spaces are intact. IMPRESSION: Moderately displaced distal tuft fracture of fifth distal phalanx. Electronically Signed   By: Lupita Raider, M.D.   On: 12/06/2017 12:42    Procedures .Marland KitchenLaceration Repair Date/Time: 12/06/2017 7:41 PM Performed by: Elisha Ponder, PA-C Authorized by: Elisha Ponder, PA-C   Consent:    Consent obtained:  Verbal   Consent given by:  Patient   Risks discussed:  Infection, pain, need for additional repair and poor wound healing Anesthesia (see  MAR for exact dosages):    Anesthesia method:  Nerve block   Block needle gauge:  25 G   Block anesthetic:  Lidocaine 1% w/o epi and bupivacaine 0.25% w/o epi   Block injection procedure:  Anatomic landmarks identified, introduced needle, incremental injection, negative aspiration for blood and anatomic landmarks palpated   Block outcome:  Anesthesia achieved Laceration details:    Location:  Finger   Finger location:  R small finger   Depth (mm):  1 Repair type:    Repair type:  Simple Pre-procedure details:    Preparation:  Patient was prepped and draped in usual sterile fashion Exploration:    Hemostasis achieved with:  Tourniquet and direct pressure   Wound exploration: wound explored through full range of motion     Contaminated: no   Treatment:    Area cleansed with:  Betadine   Amount of cleaning:  Extensive   Irrigation solution:  Sterile saline   Irrigation volume:  1000   Irrigation method:  Syringe Skin repair:    Repair  method:  Sutures   Suture size:  4-0   Wound skin closure material used: Vicryl.   Suture technique:  Simple interrupted   Number of sutures:  2 Approximation:    Approximation:  Close Post-procedure details:    Dressing:  Non-adherent dressing (Xeroform dressing)   Patient tolerance of procedure:  Tolerated well, no immediate complications   (including critical care time)  Medications Ordered in ED Medications  Tdap (BOOSTRIX) injection 0.5 mL (has no administration in time range)     Initial Impression / Assessment and Plan / ED Course  I have reviewed the triage vital signs and the nursing notes.  Pertinent labs & imaging results that were available during my care of the patient were reviewed by me and considered in my medical decision making (see chart for details).  Clinical Course as of Dec 06 2009  Mon Dec 06, 2017  1528 Spoke with PA Earney Hamburg of orthopedics, who reviewed the images in the medical chart, as well as the fracture.  Recommends follow-up in 1 week with Dr. Jena Gauss after primary closure emergency department.  Also recommend 7 days of Keflex.  I appreciate PA Jeffery's involvement in the care of this patient.    [AM]  2004 I have reviewed the patient's information in the [facility] Controlled Substance Database for the past 12 months and found them to have no Rx in past 2 years.   Opiates were prescribed for an acute, painful condition. The patient was given information on side effects and encouraged to use other, non-opiate pain medication primary, only using opiate medicine sparingly for severe pain.   [AM]    Clinical Course User Index [AM] Elisha Ponder, PA-C    Patient with distal tuft fracture of the right fifth phalanx.  Hand NVI. The wound was reapproximated, and there is no evidence of nailbed   The tip of the finger was sutured back on.  X-ray showed no foreign body.  The wound was extensively irrigated, patient tolerated procedure well.   Patient given Ancef and Tdap updated emergency department.  Patient prescribed 7 days of Keflex.  Patient to follow-up with Dr. Jena Gauss in 1 week per PA Leotis Shames.  Patient and his mother were given extensive discussion on how to care for this wound, including dressing changes and the signs and symptoms of infection.  This is a shared visit with Dr. Adalberto Cole. Patient was independently evaluated by  this attending physician. Attending physician consulted in evaluation and discharge management.  Final Clinical Impressions(s) / ED Diagnoses   Final diagnoses:  Open fracture of tuft of distal phalanx of finger    ED Discharge Orders        Ordered    cephALEXin (KEFLEX) 500 MG capsule  4 times daily     12/06/17 1947    HYDROcodone-acetaminophen (NORCO/VICODIN) 5-325 MG tablet  Every 6 hours PRN     12/06/17 1947       Delia Chimes 12/06/17 2038    Gerhard Munch, MD 12/07/17 [phone]

## 2017-12-06 NOTE — ED Triage Notes (Signed)
Pt arrives Via [facility] from home. Pt has MR and was at a day care where is rt pinky finger was shut in a door, EMS reports that bleeding was controlled PTA. EMS wrapped finger. Pt is in NAD at this time.

## 2017-12-14 ENCOUNTER — Ambulatory Visit (INDEPENDENT_AMBULATORY_CARE_PROVIDER_SITE_OTHER): Payer: Medicaid Other | Admitting: Family

## 2017-12-17 ENCOUNTER — Other Ambulatory Visit (INDEPENDENT_AMBULATORY_CARE_PROVIDER_SITE_OTHER): Payer: Self-pay | Admitting: Family

## 2017-12-17 DIAGNOSIS — G40309 Generalized idiopathic epilepsy and epileptic syndromes, not intractable, without status epilepticus: Secondary | ICD-10-CM

## 2017-12-20 ENCOUNTER — Other Ambulatory Visit (INDEPENDENT_AMBULATORY_CARE_PROVIDER_SITE_OTHER): Payer: Self-pay | Admitting: Family

## 2017-12-20 DIAGNOSIS — G40309 Generalized idiopathic epilepsy and epileptic syndromes, not intractable, without status epilepticus: Secondary | ICD-10-CM

## 2017-12-21 MED ORDER — PREDNISONE 5 MG PO TABS: 5.0000 mg | ORAL_TABLET | Freq: Every day | ORAL | 0 refills | Status: DC

## 2017-12-21 NOTE — Telephone Encounter (Signed)
Rx has been printed and electronically sent to the pharmacy

## 2017-12-28 ENCOUNTER — Encounter (INDEPENDENT_AMBULATORY_CARE_PROVIDER_SITE_OTHER): Payer: Self-pay | Admitting: Family

## 2017-12-28 ENCOUNTER — Ambulatory Visit (INDEPENDENT_AMBULATORY_CARE_PROVIDER_SITE_OTHER): Payer: Medicaid Other | Admitting: Family

## 2017-12-28 VITALS — BP 110/80 | HR 64 | Ht 66.5 in | Wt 168.4 lb

## 2017-12-28 DIAGNOSIS — R6250 Unspecified lack of expected normal physiological development in childhood: Secondary | ICD-10-CM

## 2017-12-28 DIAGNOSIS — F71 Moderate intellectual disabilities: Secondary | ICD-10-CM

## 2017-12-28 DIAGNOSIS — G40309 Generalized idiopathic epilepsy and epileptic syndromes, not intractable, without status epilepticus: Secondary | ICD-10-CM | POA: Diagnosis not present

## 2017-12-28 DIAGNOSIS — R269 Unspecified abnormalities of gait and mobility: Secondary | ICD-10-CM

## 2017-12-28 MED ORDER — LAMICTAL 25 MG PO TABS: ORAL_TABLET | ORAL | 5 refills | Status: DC

## 2017-12-28 MED ORDER — LAMICTAL 150 MG PO TABS: ORAL_TABLET | ORAL | 5 refills | Status: DC

## 2017-12-28 NOTE — Patient Instructions (Signed)
Thank you for coming in today.   Instructions for you until your next appointment are as follows: 1. Continue giving [name] the seizure medicines as you have been doing.  2. Let me know if he has seizures 3. Please plan to return for follow up in one year or sooner if needed.

## 2017-12-28 NOTE — Progress Notes (Signed)
Patient: [name] MRN: [id number] Sex: male DOB: Oct 12, 1990  Provider: Elveria Rising, NP Location of Care: [facility]  Note type: Routine return visit  History of Present Illness: Referral Source: Theadore Nan, MD History from: mother, patient and [facility] chart Chief Complaint: Generalized convulsive epilepsy   [name] is a 27 y.o. 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 This was later found to be gastroesophageal reflux and a [employer] fundoplication procedure stopped the vomiting. [name] was last seen June 12, 2016. He also has organic gait disorder, essential tremor and stuttering. He is taking and tolerating Felbatol. Lamictal, Keppra and Prednisone for his seizure disorder, and has remained seizure free on these medications.   Today Mom reports that [name] has remained seizure free since his last visit. He has ongoing brief involuntary movements of flexing his right wrist but they have not proven to be seizures. Mom reports today that [name] has been generally healthy but that he did suffer an open fracture of his right 5th finger at the day program on July 29th, and that he continues to wear a dressing and finger splint for that. Mom has no other health concerns for [name] today other than previously mentioned.  Review of Systems: Please see the HPI for neurologic and other pertinent review of systems. Otherwise, all other systems were reviewed and were negative.    Past Medical History:  Diagnosis Date  . Asthma   . Intellectual delay   . Seizures (HCC)    Hospitalizations: No., Head Injury: No., Nervous System Infections: No., Immunizations up to date: Yes.   Past Medical History Comments: The patient has been treated with broad-spectrum antiepileptic drugs and  prednisone since he came to this community in 2004. He has been admitted to [facility] with recurrent seizures. He was hospitalized November 08, 2004 with involuntary movements that were choreiform in nature. He has significant cognitive impairments, pervasive developmental delays in many domains, but no focal or lateralized neurologic findings.  MRI performed October 11, 2002 was normal. EEG Oct 05 2002 showed diffuse background slowing and multifocal sharply contoured slow-wave is principally in the central, parietal and temporal regions, right greater than left.  Surgical History Past Surgical History:  Procedure Laterality Date  . NISSEN FUNDOPLICATION  April 2009    Family History family history includes Asthma in his mother; Diabetes in his maternal grandmother; GER disease in his mother; Heart disease in his maternal grandmother; Leukemia in his maternal grandmother; Thyroid disease in his mother and sister. Family History is otherwise negative for migraines, seizures, cognitive impairment, blindness, deafness, birth defects, chromosomal disorder, autism.  Social History Social History   Socioeconomic History  . Marital status: Single    Spouse name: Not on file  . Number of children: Not on file  . Years of education: Not on file  . Highest education level: Not on file  Occupational History  . Not on file  Social Needs  . Financial resource strain: Not on file  . Food insecurity:    Worry: Not on file    Inability: Not on file  . Transportation needs:    Medical: Not on file    Non-medical: Not on file  Tobacco Use  . Smoking status: Never Smoker  . Smokeless tobacco: Never Used  Substance and Sexual Activity  . Alcohol use: No  Alcohol/week: 0.0 standard drinks  . Drug use: No  . Sexual activity: Never  Lifestyle  . Physical activity:    Days per week: Not on file    Minutes per session: Not on file  . Stress: Not on file  Relationships  . Social  connections:    Talks on phone: Not on file    Gets together: Not on file    Attends religious service: Not on file    Active member of club or organization: Not on file    Attends meetings of clubs or organizations: Not on file    Relationship status: Not on file  Other Topics Concern  . Not on file  Social History Narrative   [name] attends [employer] three days a week. He works one day at [employer]. He lives with his mother and mother's boyfriend. He enjoys basketball, riding his bike, and watching wrestling.     Allergies Allergies  Allergen Reactions  . Dilantin [Phenytoin Sodium Extended] Other (See Comments)    hallucinations  . Chloral Hydrate Hives and Rash    Physical Exam BP 110/80   Pulse 64   Ht 5' 6.5" (1.689 m)   Wt 168 lb 6.4 oz (76.4 kg)   BMI 26.77 kg/m  General: well developed, well nourished young man, seated on exam table, in no evident distress; dyed brown hair, brown eyes, right handed Head: normocephalic and atraumatic. Oropharynx benign. He has an occipital protuberance that is unchanged. Neck: supple with no carotid bruits. No focal tenderness. Cardiovascular: regular rate and rhythm, no murmurs. Respiratory: Clear to auscultation bilaterally Abdomen: Bowel sounds present all four quadrants, abdomen soft, non-tender, non-distended. No hepatosplenomegaly or masses palpated. Musculoskeletal: No skeletal deformities or obvious scoliosis. He has a dressing and splint on the distal aspect of his right 5th finger. Skin: no rashes or neurocutaneous lesions  Neurologic Exam Mental Status: Awake and fully alert. Poor eye contact. Minimal verbal responses to questions, does not initiate any conversation or questions. When he does speak he has dysarthria. Able to follow simple commands but needs frequent redirection and prompting.  Cranial Nerves: Fundoscopic exam - red reflex present.  Unable to fully visualize fundus.  Pupils equal briskly  reactive to light. Turns to localize objects and sounds in the periphery. Facial sensation intact.  Face, tongue, palate move normally and symmetrically.  Neck flexion and extension normal. Motor: Normal functional bulk, tone and strength. Examination of the right hand is limited because of his finger injury. He has mild outstretched hand tremor. Sensory: Withdrawal x 4 Coordination: Unable to fully assess due to his inability to understand instructions. No dysmetria when reaching for objects.  Gait and Station: Arises from chair, without difficulty. Stance is broad based.  Gait demonstrates fairly normal stride length. Balance is fair. Had difficulty understanding instructions for heel, toe and tandem walk. Reflexes: Diminished and symmetric. Toes downgoing. No clonus.  Impression 1.  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  History of infantile spasms and myoclonic seizures 3.  Cognitive delay 4.  Organic gait disorder 5.  Benign essential tremor 6.  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 [name] has neither had nor required imaging or lab studies since the last visit. 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  He also has cognitive delay, organic gait disorder, benign essential tremor and expressive and receptive speech disorder. [name] is taking and tolerating Felbatol, Keppra, Lamictal and Prednisone for his seizure disorder and has remained seizure free on this regimen. I asked Mom to let me know if [name] has any seizures. I will see him back in follow up in 1 year or sooner if needed. Mom agreed with the plans made today.   The medication list was reviewed and reconciled.  No changes were made in the prescribed  medications today.  A complete medication list was provided to the patient's mother.   Allergies as of 12/28/2017      Reactions   Dilantin [phenytoin Sodium Extended] Other (See Comments)   hallucinations   Chloral Hydrate Hives, Rash      Medication List        Accurate as of 12/28/17 11:59 PM. Always use your most recent med list.          albuterol (5 MG/ML) 0.5% nebulizer solution Commonly known as:  PROVENTIL Take 0.5 mLs (2.5 mg total) by nebulization every 6 (six) hours as needed for wheezing.   cephALEXin 500 MG capsule Commonly known as:  KEFLEX Take 1 capsule (500 mg total) by mouth 4 (four) times daily.   FELBATOL 600 MG tablet Generic drug:  felbamate TAKE 1 TABLET BY MOUTH EVERY MORNING AND 1/2 TABLET EVERY AFTERNOON AND 1 TABLET EVERY EVENING FOR FURTHER REFILLS NEED APPOINTMENT   HYDROcodone-acetaminophen 5-325 MG tablet Commonly known as:  NORCO/VICODIN Take 1 tablet by mouth every 6 (six) hours as needed.   hydrOXYzine 25 MG tablet Commonly known as:  ATARAX/VISTARIL Take 1 tablet (25 mg total) by mouth every 6 (six) hours as needed for anxiety or itching.   KEPPRA 500 MG tablet Generic drug:  levETIRAcetam Increase to 500 mg BID   LAMICTAL 150 MG tablet Generic drug:  lamoTRIgine Take 1 tab by mouth twice daily.   LAMICTAL 25 MG tablet Generic drug:  lamoTRIgine Take 1 tab by mouth twice daily   ondansetron 4 MG disintegrating tablet Commonly known as:  ZOFRAN-ODT Take 1 tablet (4 mg total) by mouth every 8 (eight) hours as needed for nausea or vomiting.   polyethylene glycol powder powder Commonly known as:  GLYCOLAX/MIRALAX Mix 17g of Miralax in 8 oz of water daily   predniSONE 5 MG tablet Commonly known as:  DELTASONE Take 1 tablet (5 mg total) by mouth daily.       Total time spent with the patient was 25 minutes, of which 50% or more was spent in counseling and coordination of care.   Elveria Rising NP-C

## 2017-12-29 ENCOUNTER — Encounter (INDEPENDENT_AMBULATORY_CARE_PROVIDER_SITE_OTHER): Payer: Self-pay | Admitting: Family

## 2018-03-08 ENCOUNTER — Encounter (HOSPITAL_COMMUNITY): Payer: Self-pay | Admitting: Emergency Medicine

## 2018-03-08 ENCOUNTER — Ambulatory Visit (HOSPITAL_COMMUNITY)
Admission: EM | Admit: 2018-03-08 | Discharge: 2018-03-08 | Disposition: A | Discharge status: Home / Self Care | Payer: Medicaid Other | Attending: Family Medicine | Admitting: Family Medicine

## 2018-03-08 DIAGNOSIS — J069 Acute upper respiratory infection, unspecified: Secondary | ICD-10-CM | POA: Insufficient documentation

## 2018-03-08 DIAGNOSIS — B9789 Other viral agents as the cause of diseases classified elsewhere: Secondary | ICD-10-CM

## 2018-03-08 DIAGNOSIS — R05 Cough: Secondary | ICD-10-CM | POA: Insufficient documentation

## 2018-03-08 LAB — POCT RAPID STREP A: Streptococcus, Group A Screen (Direct): NEGATIVE

## 2018-03-08 MED ORDER — BENZONATATE 100 MG PO CAPS: 100.0000 mg | ORAL_CAPSULE | Freq: Three times a day (TID) | ORAL | 0 refills | Status: DC

## 2018-03-08 NOTE — ED Provider Notes (Signed)
[facility]    CSN: [id number] Arrival date & time: 03/08/18  1020     History   Chief Complaint Chief Complaint  Patient presents with  . URI    HPI [name] is a 27 y.o. male.    URI  Presenting symptoms: congestion, cough, fever, rhinorrhea and sore throat   Severity:  Moderate Duration:  3 days Timing:  Constant Progression:  Unchanged Chronicity:  New Relieved by:  Nothing Worsened by:  Nothing Ineffective treatments:  OTC medications Associated symptoms: no arthralgias, no headaches, no myalgias, no neck pain, no sinus pain, no sneezing, no swollen glands and no wheezing   Risk factors: not elderly, no chronic cardiac disease, no chronic kidney disease, no chronic respiratory disease, no diabetes mellitus, no immunosuppression, no recent illness, no recent travel and no sick contacts     Past Medical History:  Diagnosis Date  . Asthma   . Intellectual delay   . Seizures [facility])     Patient Active Problem List   Diagnosis Date Noted  . Seizure (HCC) 05/31/2017  . Seizures (HCC) 05/31/2017  . Constipation 05/25/2016  . Generalized abdominal pain 05/25/2016  . Acne 08/02/2013  . 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 Abnormality of gait 11/17/2012  . Moderate intellectual disabilities 11/17/2012  . Developmental delay disorder 11/17/2012    Past Surgical History:  Procedure Laterality Date  . NISSEN FUNDOPLICATION  April 2009       Home Medications    Prior to Admission medications   Medication Sig Start Date End Date Taking? Authorizing Provider  albuterol (PROVENTIL) (5 MG/ML) 0.5% nebulizer solution Take 0.5 mLs (2.5 mg total) by nebulization every 6 (six) hours as needed for wheezing. Patient not taking: Reported on 12/06/2017 04/16/12   Earley Favor, NP  benzonatate (TESSALON) 100 MG capsule Take 1 capsule (100 mg total) by mouth every 8 (eight) hours. 03/08/18    Vi Biddinger, Gloris Manchester A, NP  cephALEXin (KEFLEX) 500 MG capsule Take 1 capsule (500 mg total) by mouth 4 (four) times daily. 12/06/17   Dayton Scrape, Alyssa B, PA-C  FELBATOL 600 MG tablet TAKE 1 TABLET BY MOUTH EVERY MORNING AND 1/2 TABLET EVERY AFTERNOON AND 1 TABLET EVERY EVENING FOR FURTHER REFILLS NEED APPOINTMENT 12/21/17   Elveria Rising, NP  HYDROcodone-acetaminophen (NORCO/VICODIN) 5-325 MG tablet Take 1 tablet by mouth every 6 (six) hours as needed. 12/06/17   Aviva Kluver B, PA-C  hydrOXYzine (ATARAX/VISTARIL) 25 MG tablet Take 1 tablet (25 mg total) by mouth every 6 (six) hours as needed for anxiety or itching. 06/01/17   Rama, Maryruth Bun, MD  KEPPRA 500 MG tablet Increase to 500 mg BID Patient taking differently: 600 mg. Increase to 600 mg BID 06/01/17   Rama, Maryruth Bun, MD  LAMICTAL 150 MG tablet Take 1 tab by mouth twice daily. 12/28/17   Elveria Rising, NP  LAMICTAL 25 MG tablet Take 1 tab by mouth twice daily 12/28/17   Elveria Rising, NP  ondansetron (ZOFRAN ODT) 4 MG disintegrating tablet Take 1 tablet (4 mg total) by mouth every 8 (eight) hours as needed for nausea or vomiting. Patient not taking: Reported on 12/06/2017 11/07/16   Mathews Robinsons B, PA-C  polyethylene glycol powder (GLYCOLAX/MIRALAX) powder Mix 17g of Miralax in 8 oz of water daily Patient not taking: Reported on 12/06/2017 05/25/16   Zehr, Princella Pellegrini, PA-C  predniSONE (DELTASONE) 5 MG tablet Take 1 tablet (5 mg total) by mouth daily.  12/21/17   Elveria Rising, NP    Family History Family History  Problem Relation Age of Onset  . Asthma Mother   . GER disease Mother   . Thyroid disease Mother   . Thyroid disease Sister   . Leukemia Maternal Grandmother   . Diabetes Maternal Grandmother   . Heart disease Maternal Grandmother     Social History Social History   Tobacco Use  . Smoking status: Never Smoker  . Smokeless tobacco: Never Used  Substance Use Topics  . Alcohol use: No    Alcohol/week: 0.0  standard drinks  . Drug use: No     Allergies   Dilantin [phenytoin sodium extended] and Chloral hydrate   Review of Systems Review of Systems  Constitutional: Positive for fever.  HENT: Positive for congestion, rhinorrhea and sore throat. Negative for sinus pain and sneezing.   Respiratory: Positive for cough. Negative for wheezing.   Musculoskeletal: Negative for arthralgias, myalgias and neck pain.  Neurological: Negative for headaches.     Physical Exam Triage Vital Signs ED Triage Vitals [03/08/18 1120]  Enc Vitals Group     BP      Pulse Rate 82     Resp 18     Temp 98.9 F (37.2 C)     Temp Source Oral     SpO2 96 %     Weight      Height      Head Circumference      Peak Flow      Pain Score 6     Pain Loc      Pain Edu?      Excl. in GC?    No data found.  Updated Vital Signs Pulse 82   Temp 98.9 F (37.2 C) (Oral)   Resp 18   SpO2 96%   Visual Acuity Right Eye Distance:   Left Eye Distance:   Bilateral Distance:    Right Eye Near:   Left Eye Near:    Bilateral Near:     Physical Exam  Constitutional: He appears well-developed and well-nourished.  Very pleasant. Non toxic or ill appearing.  HENT:  Head: Normocephalic and atraumatic.  Bilateral TMs normal.  External ears normal.  Mild posterior oropharyngeal erythema, 1+ tonsillar swelling without exudates. No lesions.  No lymphadenopathy.   Eyes: Conjunctivae are normal.  Neck: Normal range of motion.  Cardiovascular: Normal rate, regular rhythm and normal heart sounds.  Pulmonary/Chest: Effort normal and breath sounds normal.  Lungs clear in all fields. No dyspnea or distress. No retractions or nasal flaring.   Musculoskeletal: Normal range of motion.  Neurological: He is alert.  Skin: Skin is warm and dry. No rash noted. No erythema. No pallor.  Psychiatric: He has a normal mood and affect.  Nursing note and vitals reviewed.    UC Treatments / Results  Labs (all labs ordered  are listed, but only abnormal results are displayed) Labs Reviewed  CULTURE, GROUP A STREP [facility])  POCT RAPID STREP A    EKG None  Radiology No results found.  Procedures Procedures (including critical care time)  Medications Ordered in UC Medications - No data to display  Initial Impression / Assessment and Plan / UC Course  I have reviewed the triage vital signs and the nursing notes.  Pertinent labs & imaging results that were available during my care of the patient were reviewed by me and considered in my medical decision making (see chart for details).  Rapid strep test negative Most likely viral URI Symptomatic treatment with Tessalon Perls for cough and Mucinex over-the-counter Follow up as needed for continued or worsening symptoms  Final Clinical Impressions(s) / UC Diagnoses   Final diagnoses:  Viral URI with cough     Discharge Instructions     Rapid strep test was negative we will send for culture This is most likely a viral illness we will treat the cough with Tessalon Perles You can continue with Mucinex Follow up as needed for continued or worsening symptoms     ED Prescriptions    Medication Sig Dispense Auth. Provider   benzonatate (TESSALON) 100 MG capsule Take 1 capsule (100 mg total) by mouth every 8 (eight) hours. 21 capsule Dahlia Byes A, NP     Controlled Substance Prescriptions [place] Controlled Substance Registry consulted? Not Applicable   Janace Aris, NP 03/08/18 1225

## 2018-03-08 NOTE — ED Triage Notes (Signed)
Pt here for URI sx with cough 

## 2018-03-08 NOTE — Discharge Instructions (Addendum)
Rapid strep test was negative we will send for culture This is most likely a viral illness we will treat the cough with Tessalon Perles You can continue with Mucinex Follow up as needed for continued or worsening symptoms

## 2018-03-10 LAB — CULTURE, GROUP A STREP (THRC)

## 2018-04-24 IMAGING — DX DG ABDOMEN ACUTE W/ 1V CHEST
3 series · 3 of 3 positions shown · non-contrast
Comparison: CT 04/13/2016

CLINICAL DATA: RIGHT-sided flank pain

EXAM:
DG ABDOMEN ACUTE W/ 1V CHEST

[w chest pa]
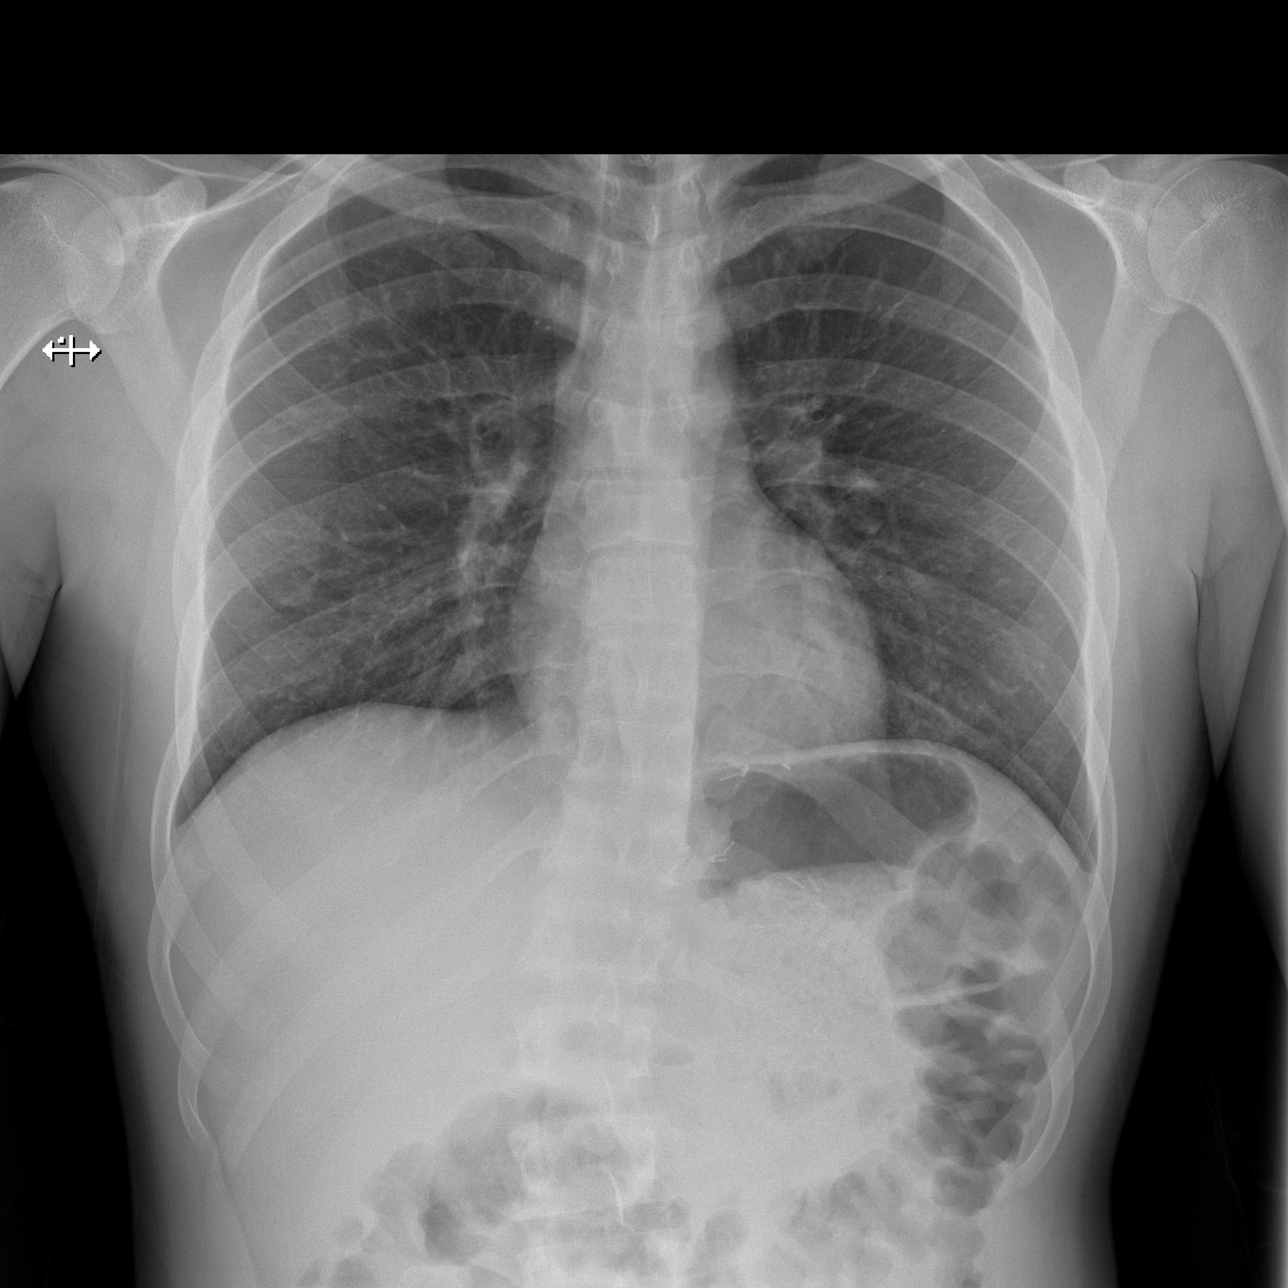

[w abdomen upright]
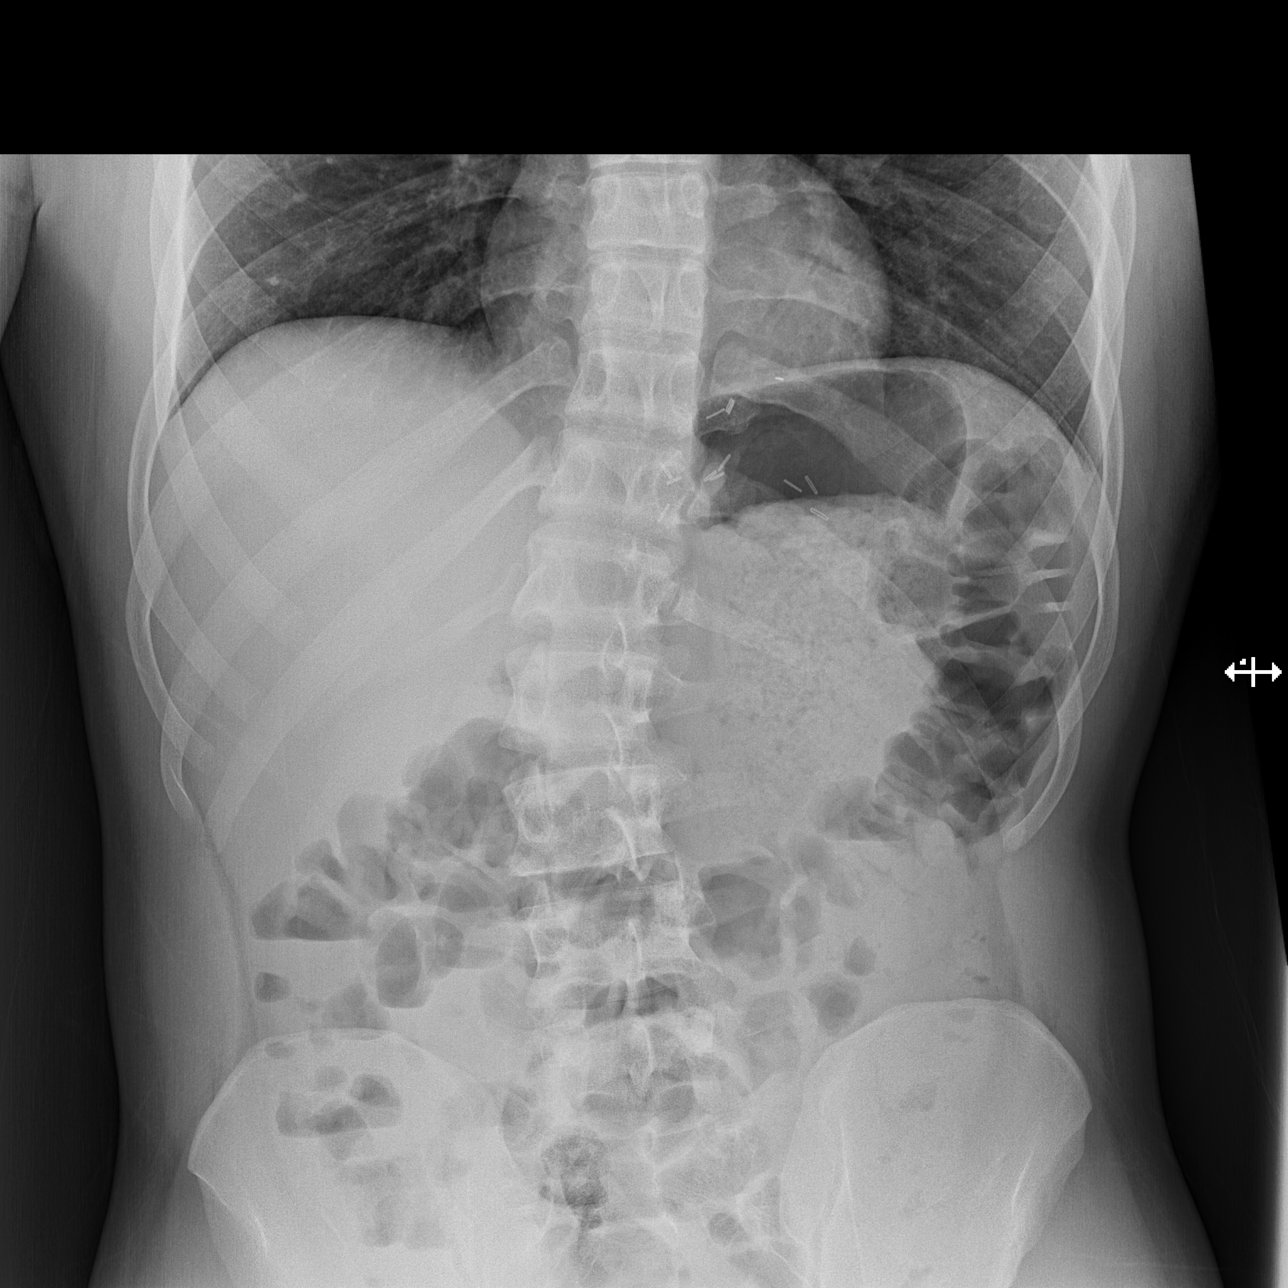

[t abdomen supine]
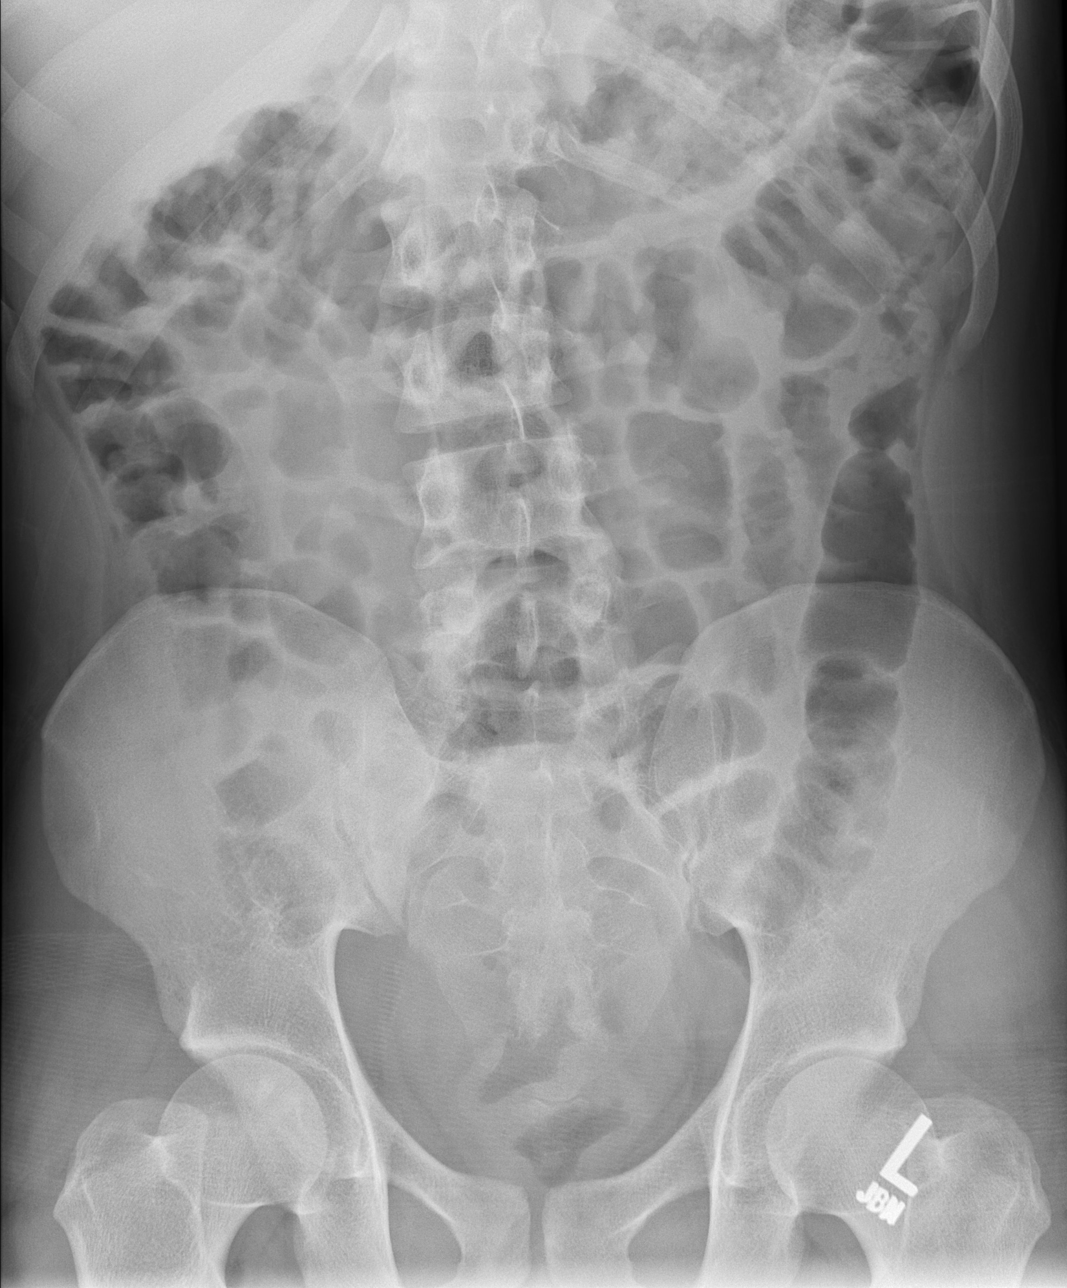

[3 of 3 positions shown; findings below may reference images not displayed]

FINDINGS: Normal mediastinum and cardiac silhouette. Normal pulmonary
vasculature. No evidence of effusion, infiltrate, or pneumothorax.
No acute bony abnormality.

No dilated large or small bowel. Gas throughout the small bowel and
colon including rectum. Multiple surgical clips in the gastric
cardiac region consistent Nissen fundoplication.
IMPRESSION: 1.  No acute cardiopulmonary process.
2. No bowel abnormality.

## 2018-05-19 ENCOUNTER — Encounter (HOSPITAL_COMMUNITY): Payer: Self-pay

## 2018-05-19 ENCOUNTER — Ambulatory Visit (HOSPITAL_COMMUNITY)
Admission: EM | Admit: 2018-05-19 | Discharge: 2018-05-19 | Disposition: A | Discharge status: Home / Self Care | Payer: Medicaid Other

## 2018-05-19 ENCOUNTER — Telehealth (INDEPENDENT_AMBULATORY_CARE_PROVIDER_SITE_OTHER): Payer: Self-pay | Admitting: Neurology

## 2018-05-19 ENCOUNTER — Emergency Department (HOSPITAL_COMMUNITY)
Admission: EM | Admit: 2018-05-19 | Discharge: 2018-05-20 | Disposition: A | Discharge status: Home / Self Care | Payer: Medicaid Other | Attending: Emergency Medicine | Admitting: Emergency Medicine

## 2018-05-19 DIAGNOSIS — R51 Headache: Secondary | ICD-10-CM | POA: Insufficient documentation

## 2018-05-19 DIAGNOSIS — Z79899 Other long term (current) drug therapy: Secondary | ICD-10-CM | POA: Diagnosis not present

## 2018-05-19 DIAGNOSIS — R4182 Altered mental status, unspecified: Secondary | ICD-10-CM | POA: Diagnosis present

## 2018-05-19 DIAGNOSIS — R519 Headache, unspecified: Secondary | ICD-10-CM

## 2018-05-19 DIAGNOSIS — J45909 Unspecified asthma, uncomplicated: Secondary | ICD-10-CM | POA: Insufficient documentation

## 2018-05-19 DIAGNOSIS — F79 Unspecified intellectual disabilities: Secondary | ICD-10-CM | POA: Diagnosis not present

## 2018-05-19 NOTE — Telephone Encounter (Signed)
This is Dr. Sharene Skeans and Elveria Rising patient so I will send it to Elveria Rising for making the decision.

## 2018-05-19 NOTE — ED Triage Notes (Signed)
Per family, pt has a seizure disorder and presented with possible seizure yesterday and today. Pt not acting himself since. Pt alert. nad noted

## 2018-05-19 NOTE — Telephone Encounter (Signed)
Mom called back. She said that last night [name] was walking toward his mother, had an odd look on his face, was staggering when he walked, didn't respond when name was called and looked confused when she got to him and attempted to intervene. She said that she got him to rest and that after several minutes his behavior returned to normal. Mom said that [name] has been compliant with medications and gets sufficient sleep at night. He has current very congested cough and Mom is thinking of taking him to Urgent Care. I told Mom that illness lowers seizure threshold and that may be why he had seizure last night. I told her that I would not change medication doses at this point but to let me know if he has more seizures. I encouraged Mom to take [name] to his PCP or to Urgent Care for evaluation of his respiratory illness. Mom agreed with this plan. TG

## 2018-05-19 NOTE — Telephone Encounter (Signed)
°  Who's calling (name and relationship to patient) : [name] - Mom    Best contact number: [phone]   Provider they see: Dr. Devonne Doughty on call   Reason for call: Mom called in last night at 8:02 PM to the [facility]. She stated that her son has a seizure disorder, was walking toward mother and he almost fell. She sat him down, ad waited about half an hour. He was taking his time, looking at his mother very oddly, and was not acting like himself like he was disoriented. Would like to verify with MD on call what to do. Please advise   Call Id # [id number]

## 2018-05-19 NOTE — Telephone Encounter (Signed)
I called Mom and left a message asking her to call me back. TG 

## 2018-05-19 NOTE — ED Notes (Signed)
Patient access reports dr hagler sent patient to ed.

## 2018-05-20 ENCOUNTER — Emergency Department (HOSPITAL_COMMUNITY): Payer: Medicaid Other

## 2018-05-20 LAB — COMPREHENSIVE METABOLIC PANEL
ALBUMIN: 3.8 g/dL (ref 3.5–5.0)
ALK PHOS: 76 U/L (ref 38–126)
ALT: 17 U/L (ref 0–44)
AST: 21 U/L (ref 15–41)
Anion gap: 8 (ref 5–15)
BUN: 10 mg/dL (ref 6–20)
CALCIUM: 8.7 mg/dL — AB (ref 8.9–10.3)
CO2: 25 mmol/L (ref 22–32)
CREATININE: 0.89 mg/dL (ref 0.61–1.24)
Chloride: 105 mmol/L (ref 98–111)
GFR calc Af Amer: >60 mL/min (ref >60)
GFR calc non Af Amer: >60 mL/min (ref >60)
GLUCOSE: 105 mg/dL — AB (ref 70–99)
Potassium: 3.6 mmol/L (ref 3.5–5.1)
SODIUM: 138 mmol/L (ref 135–145)
Total Bilirubin: 0.7 mg/dL (ref 0.3–1.2)
Total Protein: 7.3 g/dL (ref 6.5–8.1)

## 2018-05-20 LAB — URINALYSIS, COMPLETE (UACMP) WITH MICROSCOPIC
Bacteria, UA: NONE SEEN
Bilirubin Urine: NEGATIVE
Glucose, UA: NEGATIVE mg/dL
Hgb urine dipstick: NEGATIVE
Ketones, ur: NEGATIVE mg/dL
Leukocytes, UA: NEGATIVE
NITRITE: NEGATIVE
Protein, ur: NEGATIVE mg/dL
SPECIFIC GRAVITY, URINE: 1.023 (ref 1.005–1.030)
pH: 7 (ref 5.0–8.0)

## 2018-05-20 LAB — I-STAT CHEM 8, ED
BUN: 12 mg/dL (ref 6–20)
CHLORIDE: 103 mmol/L (ref 98–111)
Calcium, Ion: 1.1 mmol/L — ABNORMAL LOW (ref 1.15–1.40)
Creatinine, Ser: 0.8 mg/dL (ref 0.61–1.24)
Glucose, Bld: 101 mg/dL — ABNORMAL HIGH (ref 70–99)
HCT: 45 % (ref 39.0–52.0)
Hemoglobin: 15.3 g/dL (ref 13.0–17.0)
Potassium: 3.6 mmol/L (ref 3.5–5.1)
SODIUM: 140 mmol/L (ref 135–145)
TCO2: 28 mmol/L (ref 22–32)

## 2018-05-20 LAB — CBC WITH DIFFERENTIAL/PLATELET
Abs Immature Granulocytes: 0.03 10*3/uL (ref 0.00–0.07)
Basophils Absolute: 0.1 10*3/uL (ref 0.0–0.1)
Basophils Relative: 1 %
EOS ABS: 0.4 10*3/uL (ref 0.0–0.5)
EOS PCT: 5 %
HEMATOCRIT: 45.8 % (ref 39.0–52.0)
Hemoglobin: 16 g/dL (ref 13.0–17.0)
Immature Granulocytes: 0 %
LYMPHS ABS: 2.9 10*3/uL (ref 0.7–4.0)
Lymphocytes Relative: 35 %
MCH: 30.2 pg (ref 26.0–34.0)
MCHC: 34.9 g/dL (ref 30.0–36.0)
MCV: 86.6 fL (ref 80.0–100.0)
MONOS PCT: 7 %
Monocytes Absolute: 0.6 10*3/uL (ref 0.1–1.0)
Neutro Abs: 4.2 10*3/uL (ref 1.7–7.7)
Neutrophils Relative %: 52 %
PLATELETS: 418 10*3/uL — AB (ref 150–400)
RBC: 5.29 MIL/uL (ref 4.22–5.81)
RDW: 11.8 % (ref 11.5–15.5)
WBC: 8.3 10*3/uL (ref 4.0–10.5)
nRBC: 0 % (ref 0.0–0.2)

## 2018-05-20 LAB — AMMONIA: AMMONIA: 56 umol/L — AB (ref 9–35)

## 2018-05-20 LAB — RAPID URINE DRUG SCREEN, HOSP PERFORMED
AMPHETAMINES: NOT DETECTED
Barbiturates: NOT DETECTED
Benzodiazepines: NOT DETECTED
COCAINE: NOT DETECTED
OPIATES: NOT DETECTED
Tetrahydrocannabinol: NOT DETECTED

## 2018-05-20 LAB — I-STAT CG4 LACTIC ACID, ED: LACTIC ACID, VENOUS: 1.27 mmol/L (ref 0.5–1.9)

## 2018-05-20 LAB — ETHANOL: Alcohol, Ethyl (B): <10 mg/dL (ref <10)

## 2018-05-20 MED ORDER — IBUPROFEN 800 MG PO TABS
800.0000 mg | ORAL_TABLET | Freq: Once | ORAL | Status: AC
  Administered 2018-05-20: 800 mg via ORAL
  Filled 2018-05-20: qty 1

## 2018-05-20 NOTE — ED Provider Notes (Signed)
[facility] EMERGENCY DEPARTMENT Provider Note   CSN: [id number] Arrival date & time: 05/19/18  1844     History   Chief Complaint Chief Complaint  Patient presents with  . Seizures  . Altered Mental Status    HPI [name] is a 28 y.o. male who has a past medical history of seizure disorder and intellectual delay bone with to the emergency department by his mother.  He has a history of a multitude of types of seizure according to the mother that include both grand and petit mal.  She states that yesterday he had 2 episodes of what appeared to be absence seizure's which is abnormal for him.  She states that after that he just laid on the couch all day.  She said that she would talk to him and he would look toward her but not respond.  She also notes that he was rolling his eyes a lot.  Today he did a lot of the same behavior.  The patient reports that he is having a headache.  He reports that the headache is in the left frontal region and is throbbing.  The patient is unable to give me much more information but states that it hurts fairly significantly.  His mother reports that he does not frequently get headaches.  He has had a cough recently for the past 2 days.  The patient does acknowledge body aches and fatigue however I am unsure how reliable this information is.  There is a level 5 caveat due to developmental disability.  HPI  Past Medical History:  Diagnosis Date  . Asthma   . Intellectual delay   . Seizures [facility])     Patient Active Problem List   Diagnosis Date Noted  . Seizure (HCC) 05/31/2017  . Seizures (HCC) 05/31/2017  . Constipation 05/25/2016  . Generalized abdominal pain 05/25/2016  . Acne 08/02/2013  . 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 Abnormality of gait 11/17/2012  . Moderate intellectual disabilities 11/17/2012  . Developmental delay disorder 11/17/2012    Past  Surgical History:  Procedure Laterality Date  . NISSEN FUNDOPLICATION  April 2009        Home Medications    Prior to Admission medications   Medication Sig Start Date End Date Taking? Authorizing Provider  albuterol (PROVENTIL) (5 MG/ML) 0.5% nebulizer solution Take 0.5 mLs (2.5 mg total) by nebulization every 6 (six) hours as needed for wheezing. Patient not taking: Reported on 12/06/2017 04/16/12   Earley Favor, NP  benzonatate (TESSALON) 100 MG capsule Take 1 capsule (100 mg total) by mouth every 8 (eight) hours. 03/08/18   Bast, Gloris Manchester A, NP  cephALEXin (KEFLEX) 500 MG capsule Take 1 capsule (500 mg total) by mouth 4 (four) times daily. 12/06/17   Dayton Scrape, Alyssa B, PA-C  FELBATOL 600 MG tablet TAKE 1 TABLET BY MOUTH EVERY MORNING AND 1/2 TABLET EVERY AFTERNOON AND 1 TABLET EVERY EVENING FOR FURTHER REFILLS NEED APPOINTMENT 12/21/17   Elveria Rising, NP  HYDROcodone-acetaminophen (NORCO/VICODIN) 5-325 MG tablet Take 1 tablet by mouth every 6 (six) hours as needed. 12/06/17   Aviva Kluver B, PA-C  hydrOXYzine (ATARAX/VISTARIL) 25 MG tablet Take 1 tablet (25 mg total) by mouth every 6 (six) hours as needed for anxiety or itching. 06/01/17   Rama, Maryruth Bun, MD  KEPPRA 500 MG tablet Increase to 500 mg BID Patient taking differently: 600 mg. Increase to 600 mg BID 06/01/17  Rama, Maryruth Bunhristina P, MD  LAMICTAL 150 MG tablet Take 1 tab by mouth twice daily. 12/28/17   Elveria RisingGoodpasture, Tina, NP  LAMICTAL 25 MG tablet Take 1 tab by mouth twice daily 12/28/17   Elveria RisingGoodpasture, Tina, NP  ondansetron (ZOFRAN ODT) 4 MG disintegrating tablet Take 1 tablet (4 mg total) by mouth every 8 (eight) hours as needed for nausea or vomiting. Patient not taking: Reported on 12/06/2017 11/07/16   Mathews RobinsonsMitchell, Jessica B, PA-C  polyethylene glycol powder (GLYCOLAX/MIRALAX) powder Mix 17g of Miralax in 8 oz of water daily Patient not taking: Reported on 12/06/2017 05/25/16   Zehr, Princella PellegriniJessica D, PA-C  predniSONE (DELTASONE) 5 MG tablet  Take 1 tablet (5 mg total) by mouth daily. 12/21/17   Elveria RisingGoodpasture, Tina, NP    Family History Family History  Problem Relation Age of Onset  . Asthma Mother   . GER disease Mother   . Thyroid disease Mother   . Thyroid disease Sister   . Leukemia Maternal Grandmother   . Diabetes Maternal Grandmother   . Heart disease Maternal Grandmother     Social History Social History   Tobacco Use  . Smoking status: Never Smoker  . Smokeless tobacco: Never Used  Substance Use Topics  . Alcohol use: No    Alcohol/week: 0.0 standard drinks  . Drug use: No     Allergies   Dilantin [phenytoin sodium extended] and Chloral hydrate   Review of Systems Review of Systems  Ten systems reviewed and are negative for acute change, except as noted in the HPI.   Physical Exam Updated Vital Signs BP 122/69   Pulse 64   Temp 98.3 F (36.8 C) (Oral)   Resp (!) 26   SpO2 99%   Physical Exam Vitals signs and nursing note reviewed.  Constitutional:      General: He is not in acute distress.    Appearance: He is well-developed. He is not diaphoretic.  HENT:     Head: Normocephalic and atraumatic.  Eyes:     General: No scleral icterus.    Conjunctiva/sclera: Conjunctivae normal.  Neck:     Musculoskeletal: Normal range of motion and neck supple.  Cardiovascular:     Rate and Rhythm: Normal rate and regular rhythm.     Heart sounds: Normal heart sounds.  Pulmonary:     Effort: Pulmonary effort is normal. No respiratory distress.     Breath sounds: Normal breath sounds.  Abdominal:     Palpations: Abdomen is soft.     Tenderness: There is no abdominal tenderness.  Skin:    General: Skin is warm and dry.  Neurological:     General: No focal deficit present.     Mental Status: He is alert.     Cranial Nerves: No cranial nerve deficit.     Sensory: No sensory deficit.     Motor: No weakness.     Coordination: Coordination normal.     Gait: Gait normal.     Deep Tendon Reflexes:  Reflexes normal.  Psychiatric:        Behavior: Behavior normal.      ED Treatments / Results  Labs (all labs ordered are listed, but only abnormal results are displayed) Labs Reviewed  CBC WITH DIFFERENTIAL/PLATELET - Abnormal; Notable for the following components:      Result Value   Platelets 418 (*)    All other components within normal limits  I-STAT CHEM 8, ED - Abnormal; Notable for the following components:  Glucose, Bld 101 (*)    Calcium, Ion 1.10 (*)    All other components within normal limits  COMPREHENSIVE METABOLIC PANEL  URINALYSIS, COMPLETE (UACMP) WITH MICROSCOPIC  AMMONIA  RAPID URINE DRUG SCREEN, HOSP PERFORMED  ETHANOL  I-STAT CG4 LACTIC ACID, ED    EKG None  Radiology No results found.  Procedures Procedures (including critical care time)  Medications Ordered in ED Medications  ibuprofen (ADVIL,MOTRIN) tablet 800 mg (has no administration in time range)     Initial Impression / Assessment and Plan / ED Course  I have reviewed the triage vital signs and the nursing notes.  Pertinent labs & imaging results that were available during my care of the patient were reviewed by me and considered in my medical decision making (see chart for details).    28 year old male with history of seizure disorder and altered mental status per his mother.The differential diagnosis for AMS is extensive and includes, but is not limited to: drug overdose - opioids, alcohol, sedatives, antipsychotics, drug withdrawal, others; Metabolic: hypoxia, hypoglycemia, hyperglycemia, hypercalcemia, hypernatremia, hyponatremia, uremia, hepatic encephalopathy, hypothyroidism, hyperthyroidism, vitamin B12 or thiamine deficiency, carbon monoxide poisoning, Wilson's disease, Lactic acidosis, ; Infectious: meningitis, encephalitis, bacteremia/sepsis, urinary tract infection, pneumonia, neurosyphilis; Structural: Space-occupying lesion, (brain tumor, subdural hematoma, hydrocephalus,);  Vascular: stroke, subarachnoid hemorrhage, coronary ischemia, hypertensive encephalopathy, CNS vasculitis, thrombotic thrombocytopenic purpura, disseminated intravascular coagulation, hyperviscosity; Psychiatric: Schizophrenia, depression; Other: Seizure, hypothermia, heat stroke, ICU psychosis, dementia -"sundowning."   Review of labs shows mild hyperglycemia and slightly elevated ammonia level.I personally reviewed the patient's CT head and chest x-ray which showed no acute abnormalities.  Patient's headache improved with Motrin.  He is at his baseline mental status per his mother. His UA and UDS are negative.  Patient appears appropriate for discharge at this time Final Clinical Impressions(s) / ED Diagnoses   Final diagnoses:  Moderate headache    ED Discharge Orders    None       Arthor Captain, PA-C 05/20/18 4196    Shaune Pollack, MD 05/22/18 1350

## 2018-05-20 NOTE — ED Notes (Signed)
Patient verbalizes understanding of discharge instructions. Opportunity for questioning and answers were provided. Armband removed by staff, pt discharged from ED in wheelchair.  

## 2018-05-20 NOTE — Discharge Instructions (Addendum)

## 2018-07-05 ENCOUNTER — Other Ambulatory Visit (INDEPENDENT_AMBULATORY_CARE_PROVIDER_SITE_OTHER): Payer: Self-pay | Admitting: Family

## 2018-07-05 ENCOUNTER — Telehealth (INDEPENDENT_AMBULATORY_CARE_PROVIDER_SITE_OTHER): Payer: Self-pay | Admitting: Family

## 2018-07-05 DIAGNOSIS — G40309 Generalized idiopathic epilepsy and epileptic syndromes, not intractable, without status epilepticus: Secondary | ICD-10-CM

## 2018-07-05 MED ORDER — PREDNISONE 5 MG PO TABS: 5.0000 mg | ORAL_TABLET | Freq: Every day | ORAL | 0 refills | Status: DC

## 2018-07-05 NOTE — Telephone Encounter (Signed)
RX has been sent to the pharmacy.

## 2018-07-05 NOTE — Telephone Encounter (Signed)
Please send to the pharmacy °

## 2018-07-26 ENCOUNTER — Other Ambulatory Visit (INDEPENDENT_AMBULATORY_CARE_PROVIDER_SITE_OTHER): Payer: Self-pay | Admitting: Family

## 2018-07-26 DIAGNOSIS — G40309 Generalized idiopathic epilepsy and epileptic syndromes, not intractable, without status epilepticus: Secondary | ICD-10-CM

## 2018-07-26 MED ORDER — KEPPRA 500 MG PO TABS: ORAL_TABLET | ORAL | 5 refills | Status: DC

## 2018-07-26 MED ORDER — FELBATOL 600 MG PO TABS: ORAL_TABLET | ORAL | 5 refills | Status: DC

## 2018-07-26 NOTE — Telephone Encounter (Signed)
°  Who's calling (name and relationship to patient) : Abran Cantor to talk with her  Best contact number: [phone]  Provider they see: Elveria Rising  Reason for call:     PRESCRIPTION REFILL ONLY  Name of prescription: Keppra and Felbatol  Pharmacy: [facility] on [address]

## 2018-07-26 NOTE — Telephone Encounter (Signed)
RX has been faxed to the pharmacy.

## 2018-08-02 ENCOUNTER — Telehealth (INDEPENDENT_AMBULATORY_CARE_PROVIDER_SITE_OTHER): Payer: Self-pay | Admitting: Family

## 2018-08-02 NOTE — Telephone Encounter (Signed)
°  Who's calling (name and relationship to patient) : Acosta,Cynthia Best contact number: [phone] Provider they see: Goodpasture Reason for call: Mom needs RX sent to [facility] for an adjustable bed. [phone]     PRESCRIPTION REFILL ONLY  Name of prescription:  Pharmacy:

## 2018-08-02 NOTE — Telephone Encounter (Signed)
error 

## 2018-08-03 NOTE — Telephone Encounter (Signed)
Does [facility] have to send [place] the forms first?

## 2018-08-03 NOTE — Telephone Encounter (Signed)
I will send the order to get things started but normally the DME agency sends [place] forms for signature. Please tell Mom that Aurele will need to have face to face visit within 90 days of the referral being sent in for the bed in order for insurance to consider approving it. Thanks, Inetta Fermo

## 2018-08-09 NOTE — Telephone Encounter (Signed)
Appt sched for 4/17.

## 2018-08-24 ENCOUNTER — Other Ambulatory Visit (INDEPENDENT_AMBULATORY_CARE_PROVIDER_SITE_OTHER): Payer: Self-pay | Admitting: Family

## 2018-08-24 ENCOUNTER — Telehealth (INDEPENDENT_AMBULATORY_CARE_PROVIDER_SITE_OTHER): Payer: Self-pay | Admitting: Family

## 2018-08-24 DIAGNOSIS — G40309 Generalized idiopathic epilepsy and epileptic syndromes, not intractable, without status epilepticus: Secondary | ICD-10-CM

## 2018-08-24 MED ORDER — PREDNISONE 5 MG PO TABS: 5.0000 mg | ORAL_TABLET | Freq: Every day | ORAL | 0 refills | Status: DC

## 2018-08-24 NOTE — Telephone Encounter (Signed)
°  Who's calling (name and relationship to patient) : Sharyl Nimrod [facility] (Pharmacist)   Best contact number: [phone]  Provider they see: Elveria Rising   Reason for call: Pharmacist from [facility] org for [facility] called with a few questions about the dosage on these following medications listed.Marland Kitchen  Keppra 500 Mg, Lamictal, Falbatol 600 MG Please advise   PRESCRIPTION REFILL ONLY  Name of prescription:  Pharmacy:

## 2018-08-24 NOTE — Telephone Encounter (Signed)
Spoke with pharmacist about the dosing of the medications. [name] has not been given the correct dosage for the Lamictal. He was taking it once a day instead of twice a day

## 2018-08-24 NOTE — Telephone Encounter (Signed)
I will address this with Mom at his appointment on April 17th. TG

## 2018-08-26 ENCOUNTER — Other Ambulatory Visit: Payer: Self-pay

## 2018-08-26 ENCOUNTER — Ambulatory Visit (INDEPENDENT_AMBULATORY_CARE_PROVIDER_SITE_OTHER): Payer: Medicaid Other | Admitting: Family

## 2018-08-26 DIAGNOSIS — G40309 Generalized idiopathic epilepsy and epileptic syndromes, not intractable, without status epilepticus: Secondary | ICD-10-CM

## 2018-08-26 DIAGNOSIS — R6250 Unspecified lack of expected normal physiological development in childhood: Secondary | ICD-10-CM

## 2018-08-26 DIAGNOSIS — R269 Unspecified abnormalities of gait and mobility: Secondary | ICD-10-CM

## 2018-08-26 DIAGNOSIS — F71 Moderate intellectual disabilities: Secondary | ICD-10-CM | POA: Diagnosis not present

## 2018-08-26 NOTE — Progress Notes (Signed)
This is a Pediatric Specialist E-Visit follow up consult provided via Telephone [name] and his mother [name] consented to an E-Visit consult today.  Location of patient: [name] is at home Location of provider: Damita Dunnings is at the office Patient was referred by Joycelyn Das, FNP   The following participants were involved in this E-Visit: CMA, NP-C, patient, his mother  Chief Complain/ Reason for E-Visit today: seizure follow up Total time on call: 7 min Follow up: 6 months     Harper Kise   MRN:  [id number]  [birth year]   Provider: Elveria Rising NP-C Location of Care: [facility]  Visit type: Routine return visit  Last visit: 12/28/17  Referral source: Theadore Nan, MD History from: mother and [facility] chart  Brief history:  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 He was diagnosed with GERD and a Nissan fundoplication procedure stopped the vomiting. He also has organic gait disorder, essential tremor and stuttering. He is taking and tolerating Prednisone and brand Felbatol, Lamictal, and Keppra, and has remained seizure free for some time.   Today's concerns: Mom reports that [name] has remained seizure free but is concerned about frequent nosebleeds. She asked if a hospital type bed with the head raised would help with that. Mom says that [name] has been a little restless being at home since his day program has been closed due to Covid 19 pandemic but that she is usually able to distract him. I had a phone call earlier this week from his Care Management agency and there was some question about his seizure doses, but Mom confirmed with me that Talor is taking his medications as ordered. She is switching pharmacies and wonders if that is where the  misunderstanding occurred. Mom reports that [name] has a good appetite and generally sleeps well. She has to work to get him to drink enough water but is generally able to do so. Mom reports that [name] has been otherwise generally healthy since he was last week. She has no other health concerns for him today other than previously mentioned.   Review of systems: Please see HPI for neurologic and other pertinent review of systems. Otherwise all other systems were reviewed and were negative.  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 He has been admitted to [facility] with recurrent seizures. He was hospitalized November 08, 2004 with involuntary movements that were choreiform in nature. He has significant cognitive impairments, pervasive developmental delays in many domains, but no focal or lateralized neurologic findings.  MRI performed October 11, 2002 was normal. EEG Oct 05 2002 showed diffuse background slowing and multifocal sharply contoured slow-wave is principally in the central, parietal and temporal regions,  right greater than left.  Surgical history: Past Surgical History:  Procedure Laterality Date   NISSEN FUNDOPLICATION  April 2009     Family history: family history includes Asthma  in his mother; Diabetes in his maternal grandmother; GER disease in his mother; Heart disease in his maternal grandmother; Leukemia in his maternal grandmother; Thyroid disease in his mother and sister.   Social history: Social History   Socioeconomic History   Marital status: Single    Spouse name: Not on file   Number of children: Not on file   Years of education: Not on file   Highest education level: Not on file  Occupational History   Not on file  Social Needs   Financial resource strain: Not on file   Food insecurity:    Worry: Not on file    Inability: Not on file   Transportation needs:    Medical: Not on file    Non-medical: Not on file  Tobacco Use   Smoking status: Never Smoker   Smokeless tobacco: Never Used  Substance and Sexual Activity   Alcohol use: No    Alcohol/week: 0.0 standard drinks   Drug use: No   Sexual activity: Never  Lifestyle   Physical activity:    Days per week: Not on file    Minutes per session: Not on file   Stress: Not on file  Relationships   Social connections:    Talks on phone: Not on file    Gets together: Not on file    Attends religious service: Not on file    Active member of club or organization: Not on file    Attends meetings of clubs or organizations: Not on file    Relationship status: Not on file   Intimate partner violence:    Fear of current or ex partner: Not on file    Emotionally abused: Not on file    Physically abused: Not on file    Forced sexual activity: Not on file  Other Topics Concern   Not on file  Social History Narrative   [name] attends [employer] three days a week. He works one day at [employer]. He lives with his mother and mother's boyfriend. He enjoys basketball, riding his bike, and watching wrestling.     Allergies: Allergies  Allergen Reactions   Dilantin [Phenytoin Sodium Extended] Other (See Comments)    hallucinations   Chloral Hydrate Hives and Rash     Immunizations: Immunization History  Administered Date(s) Administered   Tdap 12/06/2017    Impression: 1.  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  History of infantile spasms and myoclonic seizures 3.  Cognitive delay 4.  Organic gait disorder 5.  Benign essential tremor 6.  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 Correll has neither had nor required imaging or lab studies since the last visit. He is a 28 year old young man with history of infantile spasms and myoclonic seizures that evolved into a mixed seizure disorder with complex partial seizures and atypical absence seizures, along with cognitive delay, organic gait disorder, benign essential tremor and speech disorder. He has remained seizure free on Prednisone and brand Felbatol, Lamictal and Keppra. He will continue these medications without change for the foreseeable future. I told his mother that a hospital bed would likely not be approved for nosebleeds and recommended that she follow up with his PCP  for this problem. I asked his mother to let me know if Oluseyi has any seizures. I will see him back in follow up in 6 months or sooner if needed. Mom agreed with the plans made today.   The medication list was reviewed and reconciled. No changes were made in the prescribed medications today. A complete medication list was provided to the patient.  Allergies as of 08/26/2018      Reactions   Dilantin [phenytoin Sodium Extended] Other (See Comments)   hallucinations   Chloral Hydrate Hives, Rash      Medication List       Accurate as of August 26, 2018  4:49 PM. Always use your most recent med list.        albuterol (5 MG/ML) 0.5% nebulizer solution Commonly known as:  PROVENTIL Take 0.5 mLs (2.5 mg total) by nebulization every 6 (six) hours as needed for wheezing.   benzonatate 100  MG capsule Commonly known as:  TESSALON Take 1 capsule (100 mg total) by mouth every 8 (eight) hours.   cephALEXin 500 MG capsule Commonly known as:  KEFLEX Take 1 capsule (500 mg total) by mouth 4 (four) times daily.   Felbatol 600 MG tablet Generic drug:  felbamate Take 1 tablet every morning, 1/2 tablet every afternoon and 1 tablet at night   HYDROcodone-acetaminophen 5-325 MG tablet Commonly known as:  NORCO/VICODIN Take 1 tablet by mouth every 6 (six) hours as needed.   hydrOXYzine 25 MG tablet Commonly known as:  ATARAX/VISTARIL Take 1 tablet (25 mg total) by mouth every 6 (six) hours as needed for anxiety or itching.   Keppra 500 MG tablet Generic drug:  levETIRAcetam Increase to 500 mg BID   LaMICtal 150 MG tablet Generic drug:  lamoTRIgine Take 1 tab by mouth twice daily.   LaMICtal 25 MG tablet Generic drug:  lamoTRIgine Take 1 tab by mouth twice daily   ondansetron 4 MG disintegrating tablet Commonly known as:  Zofran ODT Take 1 tablet (4 mg total) by mouth every 8 (eight) hours as needed for nausea or vomiting.   polyethylene glycol powder 17 GM/SCOOP powder Commonly known as:  GLYCOLAX/MIRALAX Mix 17g of Miralax in 8 oz of water daily   predniSONE 5 MG tablet Commonly known as:  DELTASONE Take 1 tablet (5 mg total) by mouth daily.       Total time spent on the phone with the patient was 7 minutes, of which 50% or more was spent in counseling and coordination of care.  Elveria Rising NP-C [facility] Ph. [phone] Fax [phone]

## 2018-08-27 ENCOUNTER — Encounter (INDEPENDENT_AMBULATORY_CARE_PROVIDER_SITE_OTHER): Payer: Self-pay | Admitting: Family

## 2018-08-27 NOTE — Patient Instructions (Signed)
Thank you for meeting with me by phone today.   Instructions for you until your next appointment are as follows: 1. Continue giving [name] the Prednisone, Felbatol, Lamictal and Keppra as you have been giving them.  2. Let me know if he has any seizures.  3. Follow up with his PCP about the nosebleeds.  4. Please sign up for MyChart if you have not done so 5. Please plan to return for follow up in 6 months or sooner if needed.

## 2018-08-29 ENCOUNTER — Other Ambulatory Visit (INDEPENDENT_AMBULATORY_CARE_PROVIDER_SITE_OTHER): Payer: Self-pay | Admitting: Family

## 2018-08-29 DIAGNOSIS — G40309 Generalized idiopathic epilepsy and epileptic syndromes, not intractable, without status epilepticus: Secondary | ICD-10-CM

## 2018-08-29 MED ORDER — LAMICTAL 25 MG PO TABS: ORAL_TABLET | ORAL | 5 refills | Status: DC

## 2018-08-29 MED ORDER — LAMICTAL 150 MG PO TABS: ORAL_TABLET | ORAL | 5 refills | Status: DC

## 2018-08-29 MED ORDER — KEPPRA 500 MG PO TABS: ORAL_TABLET | ORAL | 5 refills | Status: DC

## 2018-08-29 MED ORDER — PREDNISONE 5 MG PO TABS: 5.0000 mg | ORAL_TABLET | Freq: Every day | ORAL | 5 refills | Status: DC

## 2018-08-29 MED ORDER — FELBATOL 600 MG PO TABS: ORAL_TABLET | ORAL | 5 refills | Status: DC

## 2018-09-12 ENCOUNTER — Emergency Department (HOSPITAL_COMMUNITY): Payer: Medicaid Other

## 2018-09-12 ENCOUNTER — Telehealth (INDEPENDENT_AMBULATORY_CARE_PROVIDER_SITE_OTHER): Payer: Self-pay | Admitting: Pediatrics

## 2018-09-12 ENCOUNTER — Encounter (HOSPITAL_COMMUNITY): Payer: Self-pay | Admitting: Emergency Medicine

## 2018-09-12 ENCOUNTER — Other Ambulatory Visit: Payer: Self-pay

## 2018-09-12 ENCOUNTER — Emergency Department (HOSPITAL_COMMUNITY)
Admission: EM | Admit: 2018-09-12 | Discharge: 2018-09-12 | Disposition: A | Discharge status: Home / Self Care | Payer: Medicaid Other | Attending: Emergency Medicine | Admitting: Emergency Medicine

## 2018-09-12 DIAGNOSIS — R1084 Generalized abdominal pain: Secondary | ICD-10-CM | POA: Insufficient documentation

## 2018-09-12 DIAGNOSIS — K9289 Other specified diseases of the digestive system: Secondary | ICD-10-CM | POA: Diagnosis not present

## 2018-09-12 DIAGNOSIS — J45909 Unspecified asthma, uncomplicated: Secondary | ICD-10-CM | POA: Insufficient documentation

## 2018-09-12 DIAGNOSIS — Z79899 Other long term (current) drug therapy: Secondary | ICD-10-CM | POA: Insufficient documentation

## 2018-09-12 LAB — URINALYSIS, ROUTINE W REFLEX MICROSCOPIC
Bilirubin Urine: NEGATIVE
Glucose, UA: NEGATIVE mg/dL
Hgb urine dipstick: NEGATIVE
Ketones, ur: NEGATIVE mg/dL
Leukocytes,Ua: NEGATIVE
Nitrite: NEGATIVE
Protein, ur: NEGATIVE mg/dL
Specific Gravity, Urine: 1.005 (ref 1.005–1.030)
pH: 7 (ref 5.0–8.0)

## 2018-09-12 LAB — COMPREHENSIVE METABOLIC PANEL
ALT: 22 U/L (ref 0–44)
AST: 24 U/L (ref 15–41)
Albumin: 4.7 g/dL (ref 3.5–5.0)
Alkaline Phosphatase: 76 U/L (ref 38–126)
Anion gap: 12 (ref 5–15)
BUN: 9 mg/dL (ref 6–20)
CO2: 22 mmol/L (ref 22–32)
Calcium: 9.2 mg/dL (ref 8.9–10.3)
Chloride: 103 mmol/L (ref 98–111)
Creatinine, Ser: 1.13 mg/dL (ref 0.61–1.24)
GFR calc Af Amer: >60 mL/min (ref >60)
GFR calc non Af Amer: >60 mL/min (ref >60)
Glucose, Bld: 111 mg/dL — ABNORMAL HIGH (ref 70–99)
Potassium: 3.4 mmol/L — ABNORMAL LOW (ref 3.5–5.1)
Sodium: 137 mmol/L (ref 135–145)
Total Bilirubin: 1 mg/dL (ref 0.3–1.2)
Total Protein: 7.6 g/dL (ref 6.5–8.1)

## 2018-09-12 LAB — CBC WITH DIFFERENTIAL/PLATELET
Abs Immature Granulocytes: 0.03 10*3/uL (ref 0.00–0.07)
Basophils Absolute: 0.1 10*3/uL (ref 0.0–0.1)
Basophils Relative: 1 %
Eosinophils Absolute: 0 10*3/uL (ref 0.0–0.5)
Eosinophils Relative: 0 %
HCT: 46.5 % (ref 39.0–52.0)
Hemoglobin: 16.7 g/dL (ref 13.0–17.0)
Immature Granulocytes: 0 %
Lymphocytes Relative: 25 %
Lymphs Abs: 3 10*3/uL (ref 0.7–4.0)
MCH: 30.4 pg (ref 26.0–34.0)
MCHC: 35.9 g/dL (ref 30.0–36.0)
MCV: 84.7 fL (ref 80.0–100.0)
Monocytes Absolute: 0.7 10*3/uL (ref 0.1–1.0)
Monocytes Relative: 6 %
Neutro Abs: 8.3 10*3/uL — ABNORMAL HIGH (ref 1.7–7.7)
Neutrophils Relative %: 68 %
Platelets: 428 10*3/uL — ABNORMAL HIGH (ref 150–400)
RBC: 5.49 MIL/uL (ref 4.22–5.81)
RDW: 11.9 % (ref 11.5–15.5)
WBC: 12.1 10*3/uL — ABNORMAL HIGH (ref 4.0–10.5)
nRBC: 0 % (ref 0.0–0.2)

## 2018-09-12 LAB — LIPASE, BLOOD: Lipase: 26 U/L (ref 11–51)

## 2018-09-12 MED ORDER — LIDOCAINE VISCOUS HCL 2 % MT SOLN
15.0000 mL | Freq: Once | OROMUCOSAL | Status: AC
  Administered 2018-09-12: 15 mL via ORAL
  Filled 2018-09-12: qty 15

## 2018-09-12 MED ORDER — ACETAMINOPHEN 500 MG PO TABS
1000.0000 mg | ORAL_TABLET | Freq: Once | ORAL | Status: AC
  Administered 2018-09-12: 1000 mg via ORAL
  Filled 2018-09-12: qty 2

## 2018-09-12 MED ORDER — SODIUM CHLORIDE 0.9 % IV BOLUS
1000.0000 mL | Freq: Once | INTRAVENOUS | Status: AC
  Administered 2018-09-12: 1000 mL via INTRAVENOUS

## 2018-09-12 MED ORDER — ALUM & MAG HYDROXIDE-SIMETH 200-200-20 MG/5ML PO SUSP
30.0000 mL | Freq: Once | ORAL | Status: AC
  Administered 2018-09-12: 30 mL via ORAL
  Filled 2018-09-12: qty 30

## 2018-09-12 MED ORDER — POLYETHYLENE GLYCOL 3350 17 GM/SCOOP PO POWD: ORAL | 3 refills | Status: DC

## 2018-09-12 MED ORDER — DICYCLOMINE HCL 20 MG PO TABS: 20.0000 mg | ORAL_TABLET | Freq: Two times a day (BID) | ORAL | 0 refills | Status: DC

## 2018-09-12 MED ORDER — FAMOTIDINE 20 MG PO TABS: 20.0000 mg | ORAL_TABLET | Freq: Two times a day (BID) | ORAL | 0 refills | Status: DC

## 2018-09-12 MED ORDER — IOHEXOL 300 MG/ML  SOLN
100.0000 mL | Freq: Once | INTRAMUSCULAR | Status: AC | PRN
  Administered 2018-09-12: 100 mL via INTRAVENOUS

## 2018-09-12 NOTE — Telephone Encounter (Signed)
Mother called back concerned that patient was not improving and "not acting himself", no other interventions in the home for pain.  I advised that mother take him to the emergency room for further evaluation.   Lorenz Coaster MD MPH

## 2018-09-12 NOTE — Telephone Encounter (Signed)
Mother called concerned for change in mental status. Mother feels he isn't responding appropriately to her, but does react to her when she calls his name, tells her he's in pain.  Kidus has been passing gas and burping excessively today, reports pain in stomache and chest.  No clear seizure activity, no confusion or periods of unresponsiveness.  Yesterday he had several episodes of diarrhea.  Has had decreased PO intake the last 2 days, but is drinking fluids and solids and able to keep them down.  No vomiting.  Previously with severe reflux, but has now had Nissan. Has history of constipation, but has been having stools every 1-2 days so mother has not been giving miralax.    I advised mother I do not think this is seizure and sounds more like he is uncomfortable and focusing on discomfort instead of mother.  Mother has prilosec in the house that she just gave.  I agree with that and recommend she buy Tums or Peptobismol for him tomorrow if he still complains of pain. Also recommend lots of water, can try pedialyte or gatorade to repleat losses. Advise that she call PCP tomorrow for further evaluation, as this could be several things including viral infection, reflux, encopresis, or other GI problem that needs further management from them.  Reassured I do not think this is a neurologic emergency, recommend symptomatic management until she can talk to PCP.   Lorenz Coaster MD MPH

## 2018-09-12 NOTE — Discharge Instructions (Signed)
Thank you for allowing me to care for you today in the Emergency Department.   Take 1 tablet of Bentyl by mouth 2 times daily for abdominal pain and cramping.  For increased burping and belching, you can take 1 tablet of Pepcid two times daily.  You can also try an over-the-counter medication such as Pepto-Bismol.   I have given you a refill for MiraLAX for constipation.  If [name] continues to have abdominal pain with increased gas and burping, please follow-up with gastroenterology.  Their information is listed above.  Return to the emergency department if he develops a high fever with severe abdominal pain, persistent vomiting, black or bloody vomiting or stools, blood in his urine, or other new, concerning symptoms.

## 2018-09-12 NOTE — ED Provider Notes (Signed)
[facility] EMERGENCY DEPARTMENT Provider Note   CSN: [id number] Arrival date & time: 09/12/18  0136    History   Chief Complaint Chief Complaint  Patient presents with  . Abdominal Pain    HPI [name] is a 28 y.o. male with a h/o of asthma, epilepsy, constipation, and intellectual delay who presents to the emergency department accompanied by his mother with a chief complaint of abdominal pain.  The patient's mother reports that the patient is mostly nonverbal and has been holding his abdomen and moaning for the last 2 days.  She reports associated burping and belching.  When the patient was asked where his pain is, he points to the lower abdomen.  She reports that he has had difficulty with constipation in the past.  She reports that he has been passing a lot of gas and a small amount of stool for the last 2 days.  Denies hematochezia or melena.  The patient also nods his head yes when asked if he is having discomfort with urination.  She denies fever, chills, cough, shortness of breath, nausea, vomiting, hematuria, penile or testicular pain or swelling.  Surgical history includes a Nissen fundoplication in April 2019.  She cannot recall if he has had any previous bowel obstructions.  Reports that he is not currently followed by GI.    The history is provided by the patient. No language interpreter was used.    Past Medical History:  Diagnosis Date  . Asthma   . Intellectual delay   . Seizures [facility])     Patient Active Problem List   Diagnosis Date Noted  . Seizure (HCC) 05/31/2017  . Seizures (HCC) 05/31/2017  . Constipation 05/25/2016  . Generalized abdominal pain 05/25/2016  . Acne 08/02/2013  . 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 Abnormality of gait 11/17/2012  . Moderate intellectual disabilities 11/17/2012  . Developmental delay disorder 11/17/2012    Past Surgical  History:  Procedure Laterality Date  . NISSEN FUNDOPLICATION  April 2009        Home Medications    Prior to Admission medications   Medication Sig Start Date End Date Taking? Authorizing Provider  albuterol (PROVENTIL) (5 MG/ML) 0.5% nebulizer solution Take 0.5 mLs (2.5 mg total) by nebulization every 6 (six) hours as needed for wheezing. 04/16/12  Yes Earley Favor, NP  FELBATOL 600 MG tablet Take 1 tablet every morning, 1/2 tablet every afternoon and 1 tablet at night Patient taking differently: Take 300-600 mg by mouth See admin instructions. Take 1 tablet every morning, take 1/2 tablet every afternoon and take 1 tablet at bedtime 08/29/18  Yes Goodpasture, Inetta Fermo, NP  KEPPRA 500 MG tablet Take 1 tablet twice per day Patient taking differently: Take 500 mg by mouth 2 (two) times daily.  08/29/18  Yes Goodpasture, Inetta Fermo, NP  LAMICTAL 150 MG tablet Take 1 tab by mouth twice daily. Patient taking differently: Take 150 mg by mouth 2 (two) times daily. Take with 1 tablet of Lamictal 25 mg to equal 175 mg 08/29/18  Yes Goodpasture, Inetta Fermo, NP  LAMICTAL 25 MG tablet Take 1 tab by mouth twice daily Patient taking differently: Take 25 mg by mouth 2 (two) times daily. Take with 1 tablet of Lamictal 150 mg to equal 175 mg 08/29/18  Yes Goodpasture, Inetta Fermo, NP  predniSONE (DELTASONE) 5 MG tablet Take 1 tablet (5 mg total) by mouth daily. 08/29/18  Yes Elveria Rising, NP  dicyclomine (  BENTYL) 20 MG tablet Take 1 tablet (20 mg total) by mouth 2 (two) times daily. 09/12/18   Mahaley Schwering A, PA-C  famotidine (PEPCID) 20 MG tablet Take 1 tablet (20 mg total) by mouth 2 (two) times daily. 09/12/18   Manvi Guilliams A, PA-C  polyethylene glycol powder (GLYCOLAX/MIRALAX) 17 GM/SCOOP powder Mix 17g of Miralax in 8 oz of water daily 09/12/18   Tayra Dawe A, PA-C    Family History Family History  Problem Relation Age of Onset  . Asthma Mother   . GER disease Mother   . Thyroid disease Mother   . Thyroid disease  Sister   . Leukemia Maternal Grandmother   . Diabetes Maternal Grandmother   . Heart disease Maternal Grandmother     Social History Social History   Tobacco Use  . Smoking status: Never Smoker  . Smokeless tobacco: Never Used  Substance Use Topics  . Alcohol use: No    Alcohol/week: 0.0 standard drinks  . Drug use: No     Allergies   Dilantin [phenytoin sodium extended] and Chloral hydrate   Review of Systems Review of Systems  Gastrointestinal: Positive for abdominal pain.     Physical Exam Updated Vital Signs BP 129/64 (BP Location: Right Arm)   Pulse 100   Temp 99.3 F (37.4 C) (Rectal)   Resp (!) 21   Ht  (1.676 m)   Wt 68 kg   SpO2 100%   BMI 24.21 kg/m   Physical Exam Vitals signs and nursing note reviewed.  Constitutional:      Appearance: He is well-developed.  HENT:     Head: Normocephalic.  Eyes:     Conjunctiva/sclera: Conjunctivae normal.  Neck:     Musculoskeletal: Neck supple.  Cardiovascular:     Rate and Rhythm: Normal rate and regular rhythm.     Heart sounds: No murmur.  Pulmonary:     Effort: Pulmonary effort is normal. No respiratory distress.     Breath sounds: No stridor. No wheezing, rhonchi or rales.  Chest:     Chest wall: No tenderness.  Abdominal:     General: Abdomen is flat. Bowel sounds are increased. There is no distension.     Palpations: Abdomen is soft.     Tenderness: There is no right CVA tenderness, left CVA tenderness, guarding or rebound. Negative signs include Murphy's sign, Rovsing's sign, McBurney's sign, psoas sign and obturator sign.     Hernia: No hernia is present.  Genitourinary:    Scrotum/Testes:        Right: Swelling not present.        Left: Swelling not present.  Skin:    General: Skin is warm and dry.  Neurological:     Mental Status: He is alert.  Psychiatric:        Behavior: Behavior normal.    ED Treatments / Results  Labs (all labs ordered are listed, but only abnormal  results are displayed) Labs Reviewed  COMPREHENSIVE METABOLIC PANEL - Abnormal; Notable for the following components:      Result Value   Potassium 3.4 (*)    Glucose, Bld 111 (*)    All other components within normal limits  CBC WITH DIFFERENTIAL/PLATELET - Abnormal; Notable for the following components:   WBC 12.1 (*)    Platelets 428 (*)    Neutro Abs 8.3 (*)    All other components within normal limits  URINALYSIS, ROUTINE W REFLEX MICROSCOPIC - Abnormal; Notable for the following  components:   Color, Urine STRAW (*)    All other components within normal limits  LIPASE, BLOOD    EKG None  Radiology Ct Abdomen Pelvis W Contrast  Result Date: 09/12/2018 CLINICAL DATA:  Acute abdominal pain with fever EXAM: CT ABDOMEN AND PELVIS WITH CONTRAST TECHNIQUE: Multidetector CT imaging of the abdomen and pelvis was performed using the standard protocol following bolus administration of intravenous contrast. CONTRAST:  100mL OMNIPAQUE IOHEXOL 300 MG/ML  SOLN COMPARISON:  11/07/2016 FINDINGS: Lower chest:  No contributory findings. Hepatobiliary: No focal liver abnormality.No evidence of biliary obstruction or stone. Pancreas: Unremarkable. Spleen: Unremarkable. Adrenals/Urinary Tract: Negative adrenals. No hydronephrosis or stone. Unremarkable bladder. Stomach/Bowel: Postoperative stomach with clips about the GE junction. No obstruction. No appendicitis. Vascular/Lymphatic: No acute vascular abnormality. No mass or adenopathy. Reproductive:No pathologic findings. Other: No ascites or pneumoperitoneum. Musculoskeletal: No acute abnormalities.  Lumbar dextrocurvature. IMPRESSION: Negative abdominal CT. Electronically Signed   By: Marnee SpringJonathon  Watts M.D.   On: 09/12/2018 04:48   Dg Abdomen Acute W/chest  Result Date: 09/12/2018 CLINICAL DATA:  Abdominal pain EXAM: DG ABDOMEN ACUTE W/ 1V CHEST COMPARISON:  05/20/2018 FINDINGS: Normal heart size and mediastinal contours. There is no edema, consolidation,  effusion, or pneumothorax. Normal bowel gas pattern. No excessive stool retention. No concerning mass effect or calcification. Clips are seen at the proximal stomach. IMPRESSION: Negative abdominal radiographs.  No acute cardiopulmonary disease. Electronically Signed   By: Marnee SpringJonathon  Watts M.D.   On: 09/12/2018 04:06    Procedures Procedures (including critical care time)  Medications Ordered in ED Medications  acetaminophen (TYLENOL) tablet 1,000 mg (1,000 mg Oral Given 09/12/18 0321)  sodium chloride 0.9 % bolus 1,000 mL (0 mLs Intravenous Stopped 09/12/18 0411)  iohexol (OMNIPAQUE) 300 MG/ML solution 100 mL (100 mLs Intravenous Contrast Given 09/12/18 0419)  alum & mag hydroxide-simeth (MAALOX/MYLANTA) 200-200-20 MG/5ML suspension 30 mL (30 mLs Oral Given 09/12/18 0628)    And  lidocaine (XYLOCAINE) 2 % viscous mouth solution 15 mL (15 mLs Oral Given 09/12/18 [id number])     Initial Impression / Assessment and Plan / ED Course  I have reviewed the triage vital signs and the nursing notes.  Pertinent labs & imaging results that were available during my care of the patient were reviewed by me and considered in my medical decision making (see chart for details).        28 year old male with a h/o of asthma, epilepsy, constipation, and intellectual delay resenting from home accompanied by his mother.  On arrival, patient had a rectal temp of 100.5.  He has had no fever at home or chills.  On exam, he has generalized abdominal tenderness and hyperactive bowel sounds.  Urinalysis is unremarkable.  Metabolic panel is grossly reassuring.  BC with mild leukocytosis of 12.1, thrombocytosis of 428, and elevated absolute neutrophil count.  Given the patient's history of achalasia and worsening GI discomfort, will order CT abdomen pelvis.  CT abdomen pelvis is unremarkable.  Discussed these findings with the patient's mother.  Will discharge home with Bentyl and Pepcid.  Will give a referral to gastroenterology for  follow-up.  At this time, have a low suspicion for SBO, cholecystitis, pancreatitis, diverticulitis, testicular torsion, pyelonephritis.  The patient is hemodynamically stable and in no acute distress.  Reports improvement in symptoms after a GI cocktail.  Patient has remained afebrile and appears more comfortable than arrival.  Safe for discharge home with outpatient follow-up.  Final Clinical Impressions(s) / ED Diagnoses   Final diagnoses:  Generalized abdominal pain  Gastrointestinal dysmotility    ED Discharge Orders         Ordered    polyethylene glycol powder (GLYCOLAX/MIRALAX) 17 GM/SCOOP powder     09/12/18 0645    dicyclomine (BENTYL) 20 MG tablet  2 times daily     09/12/18 0645    famotidine (PEPCID) 20 MG tablet  2 times daily     09/12/18 0645           Maud Rubendall, Pedro Earls A, PA-C 09/12/18 0739    Mesner, Barbara Cower, MD 09/15/18 [phone]

## 2018-09-12 NOTE — ED Notes (Signed)
Patient and legal gardian verbalize understanding of discharge instructions. Opportunity for questioning and answers were provided. Armband removed by staff, pt discharged from ED.

## 2018-09-12 NOTE — ED Triage Notes (Signed)
Pt's mom st's pt has been holding his stomach and c/o pain since yesterday.  Also has been belching and passing gas more than usual.  Pt's mom also st's pt has not been eating.

## 2018-09-13 ENCOUNTER — Telehealth (INDEPENDENT_AMBULATORY_CARE_PROVIDER_SITE_OTHER): Payer: Self-pay | Admitting: Family

## 2018-09-13 NOTE — Telephone Encounter (Signed)
I talked to Mom. She said that he was seen in the ER Sunday due to abdominal pain. He had CT scans and labs, and Mom said every thing was negative. She said that he continues to have pain, refuses to eat, but will sip liquids. He is nonverbal and Mom said that he has moments of crying and appearing to be in pain. Mom wonders if his Nissan surgery done in 2009 has "come loose". She said that he was walking once and fell backward onto a sofa but did not lose consciousness. He has been staring at a times and won't respond to her as he usually does. He has been belching and passing gas, but has not had much stool. He has not vomited. Mom said that his abdomen looks flat and is soft when she touches it. Mom said that he felt warm to her earlier but that she doesn't have thermometer so she doesn't know if he has fever. I talked to Mom and told her that Anay may be in pain, and may be overwhelmed with pain being experienced and that is why he is less responsive than usual. I told her that when he fell back, it sounds like he may have had near syncope not seizure. The staring is likely related to his coping with pain and not seizure since she is able to get his attention. I recommended to Mom that she contact his PCP or take him to ER again. Mom agreed with plans made today. TG

## 2018-09-13 NOTE — Telephone Encounter (Signed)
Spoke with mom about her phone message. She states that this started on Saturday. She states that she took him to the hospital on Sunday after falling from some pain. She states that he had surgery back in 2009. The hospital checked to make sure that it has not come undone. She states that he has not been the same since Saturday. She states that she is scared because he is not the same. Please advise

## 2018-09-13 NOTE — Telephone Encounter (Signed)
I reviewed your note and agree with your assessment and recommendations.  It does not appear that this is a neurologic condition at present.  Seeing his PCP or the emergency department would appear to be appropriate.

## 2018-09-13 NOTE — Telephone Encounter (Signed)
°  Who's calling (name and relationship to patient) : [name] - Mother   Best contact number: [phone]  Provider they see: Elveria Rising   Reason for call: Mom calling stating something is wrong with [name]. He has been having some stomach issues lately but it seems like he is out of it. He is starring off and not able to hear or listen she says. She is curious if he needs to go back to the ER to be checked out. Very concerned about his eating and drinking. He will not drink or eat anything either. Please advise     PRESCRIPTION REFILL ONLY  Name of prescription:  Pharmacy:

## 2018-09-15 ENCOUNTER — Encounter: Payer: Self-pay | Admitting: Gastroenterology

## 2018-09-15 ENCOUNTER — Ambulatory Visit (INDEPENDENT_AMBULATORY_CARE_PROVIDER_SITE_OTHER): Payer: Medicaid Other | Admitting: Gastroenterology

## 2018-09-15 ENCOUNTER — Other Ambulatory Visit: Payer: Self-pay

## 2018-09-15 VITALS — Ht 66.0 in | Wt 150.0 lb

## 2018-09-15 DIAGNOSIS — K5904 Chronic idiopathic constipation: Secondary | ICD-10-CM

## 2018-09-15 DIAGNOSIS — K219 Gastro-esophageal reflux disease without esophagitis: Secondary | ICD-10-CM

## 2018-09-15 DIAGNOSIS — R1084 Generalized abdominal pain: Secondary | ICD-10-CM | POA: Diagnosis not present

## 2018-09-15 MED ORDER — FAMOTIDINE 20 MG PO TABS: 20.0000 mg | ORAL_TABLET | Freq: Every day | ORAL | 3 refills | Status: DC

## 2018-09-15 MED ORDER — DICYCLOMINE HCL 10 MG PO CAPS: 10.0000 mg | ORAL_CAPSULE | Freq: Three times a day (TID) | ORAL | 0 refills | Status: DC

## 2018-09-15 NOTE — Patient Instructions (Addendum)
Continue Miralax 1 capful daily with goal 1 soft bowel movement daily  Start Benefiber 1 teaspoon twice daily with meals  Increase fluid intake 8-10 cups water daily  Pepcid 20mg  daily as needed for heartburn and belching (new prescription sent to your pharmacy)  Bentyl 10mg   Three times daily with meals and at bedtime as needed for abdominal pain and cramping   I appreciate the  opportunity to care for you  Thank You   Marsa Aris , MD

## 2018-09-15 NOTE — Progress Notes (Signed)
[name]    [id number]    [birth year]  Primary Care Physician:[name], Daniel Fickle, FNP  Referring Physician: Joycelyn Das, FNP [address] [place], [place] [address]  This service was provided via audio and video telemedicine (Doximity) due to COVID 19 pandemic.  Patient location: Home Provider location: Office Used 2 patient identifiers to confirm the correct person. Explained the limitations in evaluation and management via telemedicine. Patient is aware of potential medical charges for this visit.  Patient consented to this virtual visit.  The persons participating in this telemedicine service were myself and the patient and his mother   Chief complaint:  Abdominal pain  HPI:  28 year old male with developmental delay, epilepsy, seizure disorder, presented to ER 3 days ago with generalized abdominal pain. Patient is mostly non verbal, history provided by his mother. Vitals stable other than temp 100.5, no significant abnormality on labs except for mild leucocytosis 12 CT abdomen and pelvis was negative for any acute abnormality  He was constipated for 2 days prior to ER visit with abdominal bloating and was only having small amount of hard stool. No rectal bleeding.  Lot of gas with belching.  S/p Nissen fundoplication April 2019 for GERD. No vomiting, dysphagia or odynophagia.   He was discharge home with Pepcid 20mg  and bentyl as needed.   He is doing better in past few days, appears comfortable during this video visit and denied any pain.    Outpatient Encounter Medications as of 09/15/2018  Medication Sig  . albuterol (PROVENTIL) (5 MG/ML) 0.5% nebulizer solution Take 0.5 mLs (2.5 mg total) by nebulization every 6 (six) hours as needed for wheezing.  . dicyclomine (BENTYL) 20 MG tablet Take 1 tablet (20 mg total) by mouth 2 (two) times daily.  . famotidine (PEPCID) 20 MG tablet Take 1 tablet (20 mg total) by mouth 2 (two) times daily.  .  FELBATOL 600 MG tablet Take 1 tablet every morning, 1/2 tablet every afternoon and 1 tablet at night (Patient taking differently: Take 300-600 mg by mouth See admin instructions. Take 1 tablet every morning, take 1/2 tablet every afternoon and take 1 tablet at bedtime)  . KEPPRA 500 MG tablet Take 1 tablet twice per day (Patient taking differently: Take 500 mg by mouth 2 (two) times daily. )  . LAMICTAL 150 MG tablet Take 1 tab by mouth twice daily. (Patient taking differently: Take 150 mg by mouth 2 (two) times daily. Take with 1 tablet of Lamictal 25 mg to equal 175 mg)  . LAMICTAL 25 MG tablet Take 1 tab by mouth twice daily (Patient taking differently: Take 25 mg by mouth 2 (two) times daily. Take with 1 tablet of Lamictal 150 mg to equal 175 mg)  . polyethylene glycol powder (GLYCOLAX/MIRALAX) 17 GM/SCOOP powder Mix 17g of Miralax in 8 oz of water daily  . predniSONE (DELTASONE) 5 MG tablet Take 1 tablet (5 mg total) by mouth daily.   No facility-administered encounter medications on file as of 09/15/2018.     Allergies as of 09/15/2018 - Review Complete 09/15/2018  Allergen Reaction Noted  . Dilantin [phenytoin sodium extended] Other (See Comments) 04/16/2012  . Chloral hydrate Hives and Rash 04/16/2012    Past Medical History:  Diagnosis Date  . Asthma   . Intellectual delay   . Seizures (HCC)     Past Surgical History:  Procedure Laterality Date  . NISSEN FUNDOPLICATION  April 2009  Family History  Problem Relation Age of Onset  . Asthma Mother   . GER disease Mother   . Thyroid disease Mother   . Thyroid disease Sister   . Leukemia Maternal Grandmother   . Diabetes Maternal Grandmother   . Heart disease Maternal Grandmother     Social History   Socioeconomic History  . Marital status: Single    Spouse name: Not on file  . Number of children: Not on file  . Years of education: Not on file  . Highest education level: Not on file  Occupational History  . Not on  file  Social Needs  . Financial resource strain: Not on file  . Food insecurity:    Worry: Not on file    Inability: Not on file  . Transportation needs:    Medical: Not on file    Non-medical: Not on file  Tobacco Use  . Smoking status: Never Smoker  . Smokeless tobacco: Never Used  Substance and Sexual Activity  . Alcohol use: No    Alcohol/week: 0.0 standard drinks  . Drug use: No  . Sexual activity: Never  Lifestyle  . Physical activity:    Days per week: Not on file    Minutes per session: Not on file  . Stress: Not on file  Relationships  . Social connections:    Talks on phone: Not on file    Gets together: Not on file    Attends religious service: Not on file    Active member of club or organization: Not on file    Attends meetings of clubs or organizations: Not on file    Relationship status: Not on file  . Intimate partner violence:    Fear of current or ex partner: Not on file    Emotionally abused: Not on file    Physically abused: Not on file    Forced sexual activity: Not on file  Other Topics Concern  . Not on file  Social History Narrative   [name] attends [employer] three days a week. He works one day at [employer]. He lives with his mother and mother's boyfriend. He enjoys basketball, riding his bike, and watching wrestling.       Review of systems: Review of Systems as per HPI All other systems reviewed and are negative.   Physical Exam: Vitals were not taken and physical exam was not performed during this virtual visit.  Data Reviewed:  Reviewed labs, radiology imaging, old records and pertinent past GI work up   Assessment and Plan/Recommendations:  [age] yr M with developmental delay, epilepsy, seizure disorder, s/p Nissen fundoplication, ER visit 3 days ago with generalized abdominal pain, low grade fever and mild leucocytosis. CT abd & pelvis negative for acute pathology He is doing better  Start Miralax 1 capful daily  to prevent constipation. Titrate dose based on response to have 1 soft BM daily Benefiber 1 teaspoon 2-3 times daily Increase water intake to 8-10 cups daily  Abdominal discomfort: bentyl 10 mg with meals and at bedtime as needed  Continue Pepcid 20mg  daily as needed for GERD symptoms  Follow up as needed    K. Scherry RanVeena  , MD   CC: Daniel DasWarden, Cynthia L, FNP

## 2018-10-12 ENCOUNTER — Other Ambulatory Visit: Payer: Self-pay | Admitting: Gastroenterology

## 2018-11-14 ENCOUNTER — Other Ambulatory Visit: Payer: Self-pay | Admitting: Gastroenterology

## 2018-12-26 ENCOUNTER — Other Ambulatory Visit: Payer: Self-pay | Admitting: Gastroenterology

## 2019-01-12 ENCOUNTER — Other Ambulatory Visit: Payer: Self-pay | Admitting: Gastroenterology

## 2019-02-02 ENCOUNTER — Other Ambulatory Visit (INDEPENDENT_AMBULATORY_CARE_PROVIDER_SITE_OTHER): Payer: Self-pay | Admitting: Family

## 2019-02-02 DIAGNOSIS — G40309 Generalized idiopathic epilepsy and epileptic syndromes, not intractable, without status epilepticus: Secondary | ICD-10-CM

## 2019-02-16 ENCOUNTER — Telehealth (INDEPENDENT_AMBULATORY_CARE_PROVIDER_SITE_OTHER): Payer: Self-pay | Admitting: Family

## 2019-02-16 NOTE — Telephone Encounter (Signed)
Mom called back. She said that she had missed some time from work recently because of [name] having a seizure and needed a note to explain that she had to be with him when seizures occurred. Mom will pick up the letter tomorrow. TG

## 2019-02-16 NOTE — Telephone Encounter (Signed)
I called and left a message for Mom asking her to call back. TG

## 2019-02-16 NOTE — Telephone Encounter (Signed)
°  Who's calling (name and relationship to patient) : Caren Griffins (Mother)  Best contact number: [phone] Provider they see: Otila Kluver Reason for call: Mother stated she needs a letter from work stating pt's condition and diagnosis and that she has to stay home with patient.

## 2019-03-22 ENCOUNTER — Other Ambulatory Visit (INDEPENDENT_AMBULATORY_CARE_PROVIDER_SITE_OTHER): Payer: Self-pay | Admitting: Family

## 2019-03-22 ENCOUNTER — Telehealth (INDEPENDENT_AMBULATORY_CARE_PROVIDER_SITE_OTHER): Payer: Self-pay | Admitting: Family

## 2019-03-22 DIAGNOSIS — G40309 Generalized idiopathic epilepsy and epileptic syndromes, not intractable, without status epilepticus: Secondary | ICD-10-CM

## 2019-03-22 MED ORDER — LAMICTAL 150 MG PO TABS: ORAL_TABLET | ORAL | 5 refills | Status: DC

## 2019-03-22 MED ORDER — PREDNISONE 5 MG PO TABS: 5.0000 mg | ORAL_TABLET | Freq: Every day | ORAL | 5 refills | Status: DC

## 2019-03-22 MED ORDER — KEPPRA 500 MG PO TABS: ORAL_TABLET | ORAL | 5 refills | Status: DC

## 2019-03-22 MED ORDER — LAMICTAL 25 MG PO TABS: ORAL_TABLET | ORAL | 5 refills | Status: DC

## 2019-03-22 MED ORDER — FELBATOL 600 MG PO TABS: ORAL_TABLET | ORAL | 5 refills | Status: DC

## 2019-03-22 NOTE — Telephone Encounter (Signed)
Please send to the pharmacy °

## 2019-03-22 NOTE — Telephone Encounter (Signed)
Refills approved. TG

## 2019-03-22 NOTE — Telephone Encounter (Signed)
°  Who's calling (name and relationship to patient) : Acosta,Cynthia Best contact number: [phone] Provider they see: [facility] Reason for call: F/U was scheduled for 11/18    PRESCRIPTION REFILL ONLY  Name of prescription: All medications prescribed by Early: Laurell Josephs

## 2019-03-29 ENCOUNTER — Encounter (INDEPENDENT_AMBULATORY_CARE_PROVIDER_SITE_OTHER): Payer: Self-pay | Admitting: Family

## 2019-03-29 ENCOUNTER — Ambulatory Visit (INDEPENDENT_AMBULATORY_CARE_PROVIDER_SITE_OTHER): Payer: Medicaid Other | Admitting: Family

## 2019-03-29 ENCOUNTER — Other Ambulatory Visit: Payer: Self-pay

## 2019-03-29 VITALS — BP 116/76 | HR 78 | Ht 66.0 in | Wt 185.4 lb

## 2019-03-29 DIAGNOSIS — F801 Expressive language disorder: Secondary | ICD-10-CM

## 2019-03-29 DIAGNOSIS — G40309 Generalized idiopathic epilepsy and epileptic syndromes, not intractable, without status epilepticus: Secondary | ICD-10-CM

## 2019-03-29 DIAGNOSIS — F71 Moderate intellectual disabilities: Secondary | ICD-10-CM

## 2019-03-29 NOTE — Progress Notes (Signed)
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 He was diagnosed with GERD and a Nissan fundoplication procedure stopped the vomiting. He also has organic gait disorder, essential tremor and stuttering. He is taking and tolerating Prednisone and brand Felbatol, Lamictal, and Keppra, and has remained seizure free for some time.   Today's concerns:  Mom reports that [name] has remained seizure free since his last visit. He has a good appetite and Mom has worked to limit snacks while they are at home more due to Covid 19 pandemic. He used to attend a day program but that has closed because of the pandemic. Mom says that [name] sleeps well and that there are no problems with behavior. He has been otherwise generally healthy since he was last seen. Mom has no other health concerns for [name] today other than previously mentioned.    Review of systems: Please see HPI for neurologic and other pertinent review of systems. Otherwise all other systems were reviewed and were negative.  Problem List: Patient Active Problem List   Diagnosis Date Noted  . Seizure (HCC) 05/31/2017  . Seizures (HCC) 05/31/2017  . Constipation 05/25/2016  . Generalized abdominal pain 05/25/2016  . Acne 08/02/2013  . 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 Abnormality of gait 11/17/2012  . Moderate intellectual disabilities 11/17/2012  . Developmental delay disorder 11/17/2012     Past Medical  History:  Diagnosis Date  . Asthma   . Intellectual delay   . Seizures (HCC)     Past medical history comments: See HPI Copied from previous record: The patient has been treated with broad-spectrum antiepileptic drugs and prednisone since he came to this community in 2004. He has been [facility] with recurrent seizures. He was hospitalized November 08, 2004 with involuntary movements that were choreiform in nature. He has significant cognitive impairments, pervasive developmental delays in many domains, but no focal or lateralized neurologic findings.  MRI performed October 11, 2002 was normal. EEG Oct 05 2002 showed diffuse background slowing and multifocal sharply contoured slow-wave is principally in the central, parietal and temporal regions, right greater than left.  Surgical history: Past Surgical History:  Procedure Laterality Date  . NISSEN FUNDOPLICATION  April 2009     Family history: family history includes Asthma in his mother; Diabetes in his maternal grandmother; GER disease in his mother; Heart disease in his maternal grandmother; Leukemia in his maternal grandmother; Thyroid disease in his mother and sister.   Social history: Social History   Socioeconomic History  . Marital status: Single    Spouse name: Not on file  . Number of children: Not on file  . Years of education: Not on file  . Highest education level: Not on file  Occupational History  . Not on file  Social Needs  . Financial resource strain: Not on file  . Food insecurity    Worry: Not on file    Inability: Not on file  . Transportation needs    Medical: Not on file    Non-medical: Not  on file  Tobacco Use  . Smoking status: Never Smoker  . Smokeless tobacco: Never Used  Substance and Sexual Activity  . Alcohol use: No    Alcohol/week: 0.0 standard drinks  . Drug use: No  . Sexual activity: Never  Lifestyle  . Physical activity    Days per week: Not on file    Minutes per  session: Not on file  . Stress: Not on file  Relationships  . Social Musician on phone: Not on file    Gets together: Not on file    Attends religious service: Not on file    Active member of club or organization: Not on file    Attends meetings of clubs or organizations: Not on file    Relationship status: Not on file  . Intimate partner violence    Fear of current or ex partner: Not on file    Emotionally abused: Not on file    Physically abused: Not on file    Forced sexual activity: Not on file  Other Topics Concern  . Not on file  Social History Narrative   Monu attends [employer] three days a week. He works one day at [employer]. He lives with his mother and mother's boyfriend. He enjoys basketball, riding his bike, and watching wrestling.      Past/failed meds: Dilantin - hallucinations  Allergies: Allergies  Allergen Reactions  . Dilantin [Phenytoin Sodium Extended] Other (See Comments)    hallucinations  . Chloral Hydrate Hives and Rash    Immunizations: Immunization History  Administered Date(s) Administered  . Tdap 12/06/2017    Diagnostics/Screenings: 06/01/17 - rEEG - normal   Physical Exam: BP 116/76   Pulse 78   Ht 5\' 6"  (1.676 m)   Wt 185 lb 6.4 oz (84.1 kg)   BMI 29.92 kg/m   General: well developed, well nourished young man, seated, in no evident distress; brown hair, brown eyes, right handed Head: normocephalic and atraumatic. Oropharynx benign. No dysmorphic features. Neck: supple with no carotid bruits. Cardiovascular: regular rate and rhythm, no murmurs. Respiratory: Clear to auscultation bilaterally Abdomen: Bowel sounds present all four quadrants, abdomen soft, non-tender, non-distended. Musculoskeletal: No skeletal deformities or obvious scoliosis.  Skin: no rashes or neurocutaneous lesions  Neurologic Exam Mental Status: Awake and fully alert. Has limited language. Variable eye contact. Smiles  responsively. Tolerant of invasions into his space. Able to follow most instructions and participate in examination Cranial Nerves: Fundoscopic exam - red reflex present.  Unable to fully visualize fundus.  Pupils equal briskly reactive to light. Extraocular movements intact without nystagmus. Hearing intact to voice. Facial movements are symmetric. Shoulder shrug normal. Motor: Normal functional bulk, tone and strength. Mild outstretched hand tremor Sensory: Withdrawal x 4 Coordination: Rapid alternating movements symmetric bilaterally. Finger to nose symmetric bilaterally. Unable to follow instructions for Romberg. Gait and Station: Able to stand and bear weight. Stance and gait are wide based. Balance is normal. Able to walk on toes and heels but had clumsiness with tandem gait. Reflexes: Diminished and symmetric. Toes neutral. No clonus   Impression: 1. 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 History of infantile spasms and myoclonic seizures 3. Cognitive delay 4. Organic gait disorder 5. Benign essential tremor 6. 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 [name] has neither had nor required imaging or lab studies since the last visit. 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 He has remained seizure free on Prednisone, brand Felbatol, Lamictal and Keppra. I asked Mom to continue his medications as ordered, and to let me know if he has any seizures. I will otherwise see [name] back in follow up in 6 months or sooner if needed.   The medication list was reviewed and reconciled. No changes were made in the prescribed  medications today. A complete medication list was provided to the patient.  Allergies as of 03/29/2019      Reactions   Dilantin [phenytoin Sodium Extended] Other (See Comments)   hallucinations   Chloral Hydrate Hives, Rash      Medication List       Accurate as of March 29, 2019  3:32 PM. If you have any questions, ask your nurse or doctor.        albuterol (5 MG/ML) 0.5% nebulizer solution Commonly known as: PROVENTIL Take 0.5 mLs (2.5 mg total) by nebulization every 6 (six) hours as needed for wheezing.   dicyclomine 10 MG capsule Commonly known as: BENTYL TAKE ONE CAPSULE BY MOUTH FOUR TIMES A DAY (BEFORE MEALS AND AT BEDTIME)   famotidine 20 MG tablet Commonly known as: PEPCID TAKE ONE TABLET BY MOUTH DAILY   Felbatol 600 MG tablet Generic drug: felbamate Take 1 tablet every morning, 1/2 tablet every afternoon and 1 tablet at night   Keppra 500 MG tablet Generic drug: levETIRAcetam Take 1 tablet twice per day   LaMICtal 150 MG tablet Generic drug: lamoTRIgine Take 1 tab by mouth twice daily.   LaMICtal 25 MG tablet Generic drug: lamoTRIgine Take 1 tab by mouth twice daily   polyethylene glycol powder 17 GM/SCOOP powder Commonly known as: GLYCOLAX/MIRALAX Mix 17g of Miralax in 8 oz of water daily   predniSONE 5 MG tablet Commonly known as: DELTASONE Take 1 tablet (5 mg total) by mouth daily.        Total time spent with the patient was 20 minutes, of which 50% or more was spent in counseling and coordination of care.  Elveria Rising NP-C [facility] Ph. [phone] Fax [phone]

## 2019-04-03 ENCOUNTER — Encounter (INDEPENDENT_AMBULATORY_CARE_PROVIDER_SITE_OTHER): Payer: Self-pay | Admitting: Family

## 2019-04-03 DIAGNOSIS — F801 Expressive language disorder: Secondary | ICD-10-CM | POA: Insufficient documentation

## 2019-04-03 NOTE — Patient Instructions (Signed)
Thank you for coming in today.   Instructions for you until your next appointment are as follows: 1. Continue giving the Prednisone, Keppra, Lamictal and Felbatol as ordered.  2. Let me know if [name] has any seizures 3. Please sign up for MyChart if you have not done so 4. Please plan to return for follow up in 6 months or sooner if needed.

## 2019-05-08 ENCOUNTER — Other Ambulatory Visit: Payer: Self-pay | Admitting: Gastroenterology

## 2019-07-03 ENCOUNTER — Other Ambulatory Visit: Payer: Self-pay | Admitting: Gastroenterology

## 2019-07-26 ENCOUNTER — Other Ambulatory Visit: Payer: Self-pay

## 2019-07-26 ENCOUNTER — Ambulatory Visit (INDEPENDENT_AMBULATORY_CARE_PROVIDER_SITE_OTHER): Payer: Medicaid Other | Admitting: Primary Care

## 2019-07-26 ENCOUNTER — Encounter (INDEPENDENT_AMBULATORY_CARE_PROVIDER_SITE_OTHER): Payer: Self-pay | Admitting: Primary Care

## 2019-07-26 VITALS — BP 117/79 | HR 73 | Temp 97.3°F | Ht 66.0 in | Wt 184.2 lb

## 2019-07-26 DIAGNOSIS — Z7689 Persons encountering health services in other specified circumstances: Secondary | ICD-10-CM | POA: Diagnosis not present

## 2019-07-26 DIAGNOSIS — F71 Moderate intellectual disabilities: Secondary | ICD-10-CM

## 2019-07-26 NOTE — Patient Instructions (Signed)

## 2019-07-26 NOTE — Progress Notes (Signed)
Established Patient Office Visit  Subjective:  Patient ID: [name], male    DOB: 07/23/1990  Age: 29 y.o. MRN: [id number]  CC:  Chief Complaint  Patient presents with  . New Patient (Initial Visit)    HPI [name] presents for to establish care he is accompanied by his mother Aram Beecham he has some mental delay. She is complaining of frequency and burning with urination. We discussed the direction she wipe after using the bathroom and demonstrated wiping back to front increase risk of urinary tract infections with ecoli .  Past Medical History:  Diagnosis Date  . Asthma   . Intellectual delay   . Seizures (HCC)     Past Surgical History:  Procedure Laterality Date  . NISSEN FUNDOPLICATION  April 2009    Family History  Problem Relation Age of Onset  . Asthma Mother   . GER disease Mother   . Thyroid disease Mother   . Thyroid disease Sister   . Leukemia Maternal Grandmother   . Diabetes Maternal Grandmother   . Heart disease Maternal Grandmother     Social History   Socioeconomic History  . Marital status: Single    Spouse name: Not on file  . Number of children: Not on file  . Years of education: Not on file  . Highest education level: Not on file  Occupational History  . Not on file  Tobacco Use  . Smoking status: Never Smoker  . Smokeless tobacco: Never Used  Substance and Sexual Activity  . Alcohol use: No    Alcohol/week: 0.0 standard drinks  . Drug use: No  . Sexual activity: Never  Other Topics Concern  . Not on file  Social History Narrative   [name] attends [employer] three days a week. He works one day at [employer]. He lives with his mother and mother's boyfriend. He enjoys basketball, riding his bike, and watching wrestling.    Social Determinants of Health   Financial Resource Strain:   . Difficulty of Paying Living Expenses:   Food Insecurity:   . Worried About Programme researcher, broadcasting/film/video in the Last Year:   . Garment/textile technologist in the Last Year:   Transportation Needs:   . Freight forwarder (Medical):   Marland Kitchen Lack of Transportation (Non-Medical):   Physical Activity:   . Days of Exercise per Week:   . Minutes of Exercise per Session:   Stress:   . Feeling of Stress :   Social Connections:   . Frequency of Communication with Friends and Family:   . Frequency of Social Gatherings with Friends and Family:   . Attends Religious Services:   . Active Member of Clubs or Organizations:   . Attends Banker Meetings:   Marland Kitchen Marital Status:   Intimate Partner Violence:   . Fear of Current or Ex-Partner:   . Emotionally Abused:   Marland Kitchen Physically Abused:   . Sexually Abused:     Outpatient Medications Prior to Visit  Medication Sig Dispense Refill  . albuterol (PROVENTIL) (5 MG/ML) 0.5% nebulizer solution Take 0.5 mLs (2.5 mg total) by nebulization every 6 (six) hours as needed for wheezing. 20 mL 12  . KEPPRA 500 MG tablet Take 1 tablet twice per day 60 tablet 5  . LAMICTAL 150 MG tablet Take 1 tab by mouth twice daily. 60 tablet 5  . LAMICTAL 25 MG tablet Take 1 tab by mouth twice daily 60 tablet 5  . predniSONE (  DELTASONE) 5 MG tablet Take 1 tablet (5 mg total) by mouth daily. 30 tablet 5  . dicyclomine (BENTYL) 10 MG capsule TAKE ONE CAPSULE BY MOUTH FOUR TIMES A DAY (BEFORE MEALS AND AT BEDTIME) (Patient not taking: Reported on 07/26/2019) 120 capsule 0  . famotidine (PEPCID) 20 MG tablet TAKE ONE TABLET BY MOUTH DAILY 30 tablet 3  . FELBATOL 600 MG tablet Take 1 tablet every morning, 1/2 tablet every afternoon and 1 tablet at night 75 tablet 5  . polyethylene glycol powder (GLYCOLAX/MIRALAX) 17 GM/SCOOP powder Mix 17g of Miralax in 8 oz of water daily 255 g 3   No facility-administered medications prior to visit.    Allergies  Allergen Reactions  . Dilantin [Phenytoin Sodium Extended] Other (See Comments)    hallucinations  . Chloral Hydrate Hives and Rash    ROS Review of Systems   Genitourinary:       Urinary and bowel incontinence   Neurological: Positive for seizures.  Psychiatric/Behavioral: Positive for behavioral problems.  All other systems reviewed and are negative.     Objective:    Physical Exam  Constitutional: He is oriented to person, place, and time. He appears well-developed and well-nourished.  HENT:  Head: Normocephalic.  Eyes: Pupils are equal, round, and reactive to light. EOM are normal.  Cardiovascular: Normal rate and regular rhythm.  Pulmonary/Chest: Effort normal and breath sounds normal.  Abdominal: Soft. Bowel sounds are normal.  Musculoskeletal:        General: Normal range of motion.     Cervical back: Normal range of motion.  Neurological: He is alert and oriented to person, place, and time. He has normal reflexes.  Skin: Skin is warm and dry.  Psychiatric: He has a normal mood and affect. His behavior is normal. Thought content normal.    BP 117/79 (BP Location: Right Arm, Patient Position: Sitting, Cuff Size: Normal)   Pulse 73   Temp (!) 97.3 F (36.3 C) (Temporal)   Ht 5\' 6"  (1.676 m)   Wt 184 lb 3.2 oz (83.6 kg)   BMI 29.73 kg/m  Wt Readings from Last 3 Encounters:  07/26/19 184 lb 3.2 oz (83.6 kg)  03/29/19 185 lb 6.4 oz (84.1 kg)  09/15/18 150 lb (68 kg)     Health Maintenance Due  Topic Date Due  . INFLUENZA VACCINE  Never done    There are no preventive care reminders to display for this patient.  No results found for: TSH Lab Results  Component Value Date   WBC 12.1 (H) 09/12/2018   HGB 16.7 09/12/2018   HCT 46.5 09/12/2018   MCV 84.7 09/12/2018   PLT 428 (H) 09/12/2018   Lab Results  Component Value Date   NA 137 09/12/2018   K 3.4 (L) 09/12/2018   CO2 22 09/12/2018   GLUCOSE 111 (H) 09/12/2018   BUN 9 09/12/2018   CREATININE 1.13 09/12/2018   BILITOT 1.0 09/12/2018   ALKPHOS 76 09/12/2018   AST 24 09/12/2018   ALT 22 09/12/2018   PROT 7.6 09/12/2018   ALBUMIN 4.7 09/12/2018    CALCIUM 9.2 09/12/2018   ANIONGAP 12 09/12/2018     Assessment & Plan:  Timothyjames was seen today for new patient (initial visit).  Diagnoses and all orders for this visit:  Encounter to establish care Gwinda Passe, NP-C will be your  (PCP) she is mastered prepared . She is skilled to diagnosed and treat illness. Also able to answer health concern as well as  continuing care of varied medical conditions, not limited by cause, organ system, or diagnosis.   -     CBC with Differential -     CMP14+EGFR  Class 1 obesity due to excess calories without serious comorbidity in adult, unspecified BMI (HCC) Body mass index is 29.73 kg/m.   Obesity is 30-39  indicating an excess in caloric intake or underlining conditions. This may lead to other co-morbidities. Hypertension, cardiovascular disease and respiratory complication. Lifestyle modifications of diet and exercise may reduce obesity.   -     Lipid Panel  Moderate intellectual disabilities Sister assist with care very high functioning adult. Followed by neurology     Follow-up: Return if symptoms worsen or fail to improve.    Grayce Sessions, NP

## 2019-07-27 LAB — CBC WITH DIFFERENTIAL/PLATELET
Basophils Absolute: 0.1 x10E3/uL (ref 0.0–0.2)
Basos: 1 %
EOS (ABSOLUTE): 0.2 x10E3/uL (ref 0.0–0.4)
Eos: 2 %
Hematocrit: 48.4 % (ref 37.5–51.0)
Hemoglobin: 16.8 g/dL (ref 13.0–17.7)
Immature Grans (Abs): 0 x10E3/uL (ref 0.0–0.1)
Immature Granulocytes: 0 %
Lymphocytes Absolute: 3.5 x10E3/uL — ABNORMAL HIGH (ref 0.7–3.1)
Lymphs: 44 %
MCH: 30.5 pg (ref 26.6–33.0)
MCHC: 34.7 g/dL (ref 31.5–35.7)
MCV: 88 fL (ref 79–97)
Monocytes Absolute: 0.7 x10E3/uL (ref 0.1–0.9)
Monocytes: 9 %
Neutrophils Absolute: 3.5 x10E3/uL (ref 1.4–7.0)
Neutrophils: 44 %
Platelets: 404 x10E3/uL (ref 150–450)
RBC: 5.5 x10E6/uL (ref 4.14–5.80)
RDW: 12.8 % (ref 11.6–15.4)
WBC: 8.1 x10E3/uL (ref 3.4–10.8)

## 2019-07-27 LAB — CMP14+EGFR
ALT: 30 IU/L (ref 0–44)
AST: 23 IU/L (ref 0–40)
Albumin/Globulin Ratio: 1.7 (ref 1.2–2.2)
Albumin: 4.8 g/dL (ref 4.1–5.2)
Alkaline Phosphatase: 96 IU/L (ref 39–117)
BUN/Creatinine Ratio: 11 (ref 9–20)
BUN: 10 mg/dL (ref 6–20)
Bilirubin Total: 0.4 mg/dL (ref 0.0–1.2)
CO2: 25 mmol/L (ref 20–29)
Calcium: 9.5 mg/dL (ref 8.7–10.2)
Chloride: 102 mmol/L (ref 96–106)
Creatinine, Ser: 0.9 mg/dL (ref 0.76–1.27)
GFR calc Af Amer: 136 mL/min/{1.73_m2} (ref >59)
GFR calc non Af Amer: 117 mL/min/{1.73_m2} (ref >59)
Globulin, Total: 2.8 g/dL (ref 1.5–4.5)
Glucose: 86 mg/dL (ref 65–99)
Potassium: 4.5 mmol/L (ref 3.5–5.2)
Sodium: 140 mmol/L (ref 134–144)
Total Protein: 7.6 g/dL (ref 6.0–8.5)

## 2019-07-27 LAB — LIPID PANEL
Chol/HDL Ratio: 4.7 ratio (ref 0.0–5.0)
Cholesterol, Total: 188 mg/dL (ref 100–199)
HDL: 40 mg/dL
LDL Chol Calc (NIH): 124 mg/dL — ABNORMAL HIGH (ref 0–99)
Triglycerides: 135 mg/dL (ref 0–149)
VLDL Cholesterol Cal: 24 mg/dL (ref 5–40)

## 2019-08-02 ENCOUNTER — Ambulatory Visit (HOSPITAL_COMMUNITY)
Admission: EM | Admit: 2019-08-02 | Discharge: 2019-08-02 | Disposition: A | Discharge status: Home / Self Care | Payer: Medicaid Other | Attending: Family Medicine | Admitting: Family Medicine

## 2019-08-02 ENCOUNTER — Encounter (HOSPITAL_COMMUNITY): Payer: Self-pay | Admitting: Emergency Medicine

## 2019-08-02 ENCOUNTER — Other Ambulatory Visit: Payer: Self-pay

## 2019-08-02 DIAGNOSIS — R21 Rash and other nonspecific skin eruption: Secondary | ICD-10-CM | POA: Diagnosis not present

## 2019-08-02 MED ORDER — CLOTRIMAZOLE-BETAMETHASONE 1-0.05 % EX CREA: TOPICAL_CREAM | CUTANEOUS | 0 refills | Status: DC

## 2019-08-02 NOTE — ED Provider Notes (Signed)
[facility]   [id number] 08/02/19 Arrival Time: 1848  ASSESSMENT & PLAN:  1. Rash and nonspecific skin eruption    Likely tinea.  Begin: Meds ordered this encounter  Medications  . clotrimazole-betamethasone (LOTRISONE) cream    Sig: Apply to affected area 2 times daily for 1-2 weeks at most.    Dispense:  15 g    Refill:  0    School note provided. Will follow up with PCP or here if worsening or failing to improve as anticipated. Reviewed expectations re: course of current medical issues. Questions answered. Outlined signs and symptoms indicating need for more acute intervention. Patient verbalized understanding. After Visit Summary given.   SUBJECTIVE:  [name] is a 29 y.o. male who presents with a skin complaint. Rash. Antecubital of RUE. Present 1-2 weeks. Using Rx triamcinolone with little relief. Significant itching. Does have pet dog at home. Afebrile. No bleeding or drainage around rash.   OBJECTIVE: Vitals:   08/02/19 1904  BP: 125/79  Pulse: (!) 103  Resp: 16  Temp: 99.5 F (37.5 C)  TempSrc: Oral  SpO2: 96%    General appearance: alert; no distress HEENT: ; AT Neck: supple with FROM Lungs: unlabored respirations Heart: very slight tachycardia noted Extremities: no edema; moves all extremities normally Skin: warm and dry; irregular circular area of erythema with slightly raised borders in antecubital fossa of RUE; no crusting, drainage, or bleeding Psychological: alert and cooperative; normal mood and affect  Allergies  Allergen Reactions  . Dilantin [Phenytoin Sodium Extended] Other (See Comments)    hallucinations  . Chloral Hydrate Hives and Rash    Past Medical History:  Diagnosis Date  . Asthma   . Intellectual delay   . Seizures (HCC)    Social History   Socioeconomic History  . Marital status: Single    Spouse name: Not on file  . Number of children: Not on file  . Years of education: Not on file  . Highest  education level: Not on file  Occupational History  . Not on file  Tobacco Use  . Smoking status: Never Smoker  . Smokeless tobacco: Never Used  Substance and Sexual Activity  . Alcohol use: No    Alcohol/week: 0.0 standard drinks  . Drug use: No  . Sexual activity: Never  Other Topics Concern  . Not on file  Social History Narrative   Thales attends [employer] three days a week. He works one day at [employer]. He lives with his mother and mother's boyfriend. He enjoys basketball, riding his bike, and watching wrestling.    Social Determinants of Health   Financial Resource Strain:   . Difficulty of Paying Living Expenses:   Food Insecurity:   . Worried About Programme researcher, broadcasting/film/video in the Last Year:   . Barista in the Last Year:   Transportation Needs:   . Freight forwarder (Medical):   Marland Kitchen Lack of Transportation (Non-Medical):   Physical Activity:   . Days of Exercise per Week:   . Minutes of Exercise per Session:   Stress:   . Feeling of Stress :   Social Connections:   . Frequency of Communication with Friends and Family:   . Frequency of Social Gatherings with Friends and Family:   . Attends Religious Services:   . Active Member of Clubs or Organizations:   . Attends Banker Meetings:   Marland Kitchen Marital Status:   Intimate Partner Violence:   .  Fear of Current or Ex-Partner:   . Emotionally Abused:   Marland Kitchen Physically Abused:   . Sexually Abused:    Family History  Problem Relation Age of Onset  . Asthma Mother   . GER disease Mother   . Thyroid disease Mother   . Thyroid disease Sister   . Leukemia Maternal Grandmother   . Diabetes Maternal Grandmother   . Heart disease Maternal Grandmother    Past Surgical History:  Procedure Laterality Date  . NISSEN FUNDOPLICATION  April 6803     Vanessa Kick, MD 08/02/19 1946

## 2019-08-02 NOTE — ED Triage Notes (Signed)
Rash on inside of right elbow  Using triamcinolone, not helping  Rash is painful

## 2019-08-10 ENCOUNTER — Other Ambulatory Visit (INDEPENDENT_AMBULATORY_CARE_PROVIDER_SITE_OTHER): Payer: Self-pay | Admitting: Family

## 2019-08-10 DIAGNOSIS — G40309 Generalized idiopathic epilepsy and epileptic syndromes, not intractable, without status epilepticus: Secondary | ICD-10-CM

## 2019-08-10 NOTE — Telephone Encounter (Signed)
Please send to the pharmacy °

## 2019-09-04 ENCOUNTER — Telehealth (INDEPENDENT_AMBULATORY_CARE_PROVIDER_SITE_OTHER): Payer: Self-pay | Admitting: Family

## 2019-09-04 NOTE — Telephone Encounter (Signed)
  Who's calling (name and relationship to patient) :Mom / Aram Beecham   Best contact [phone] Provider they HRV:[facility]   Reason for call:mom stated that her son had a possible seizure and had to leave work and would like a work note to give to them if possible.mom said you can leave a message if she doesn't answer and she will call back      PRESCRIPTION REFILL ONLY  Name of prescription:  Pharmacy:

## 2019-09-04 NOTE — Telephone Encounter (Signed)
I called and talked to Mom. She said that [name] had a brief seizure this morning and that she stayed home from work to care for him. She needs a note for her employer explaining why she missed work today. I told Mom that I would write the note and that she can pick it up today.  TG

## 2019-09-07 ENCOUNTER — Other Ambulatory Visit (INDEPENDENT_AMBULATORY_CARE_PROVIDER_SITE_OTHER): Payer: Self-pay | Admitting: Primary Care

## 2019-09-07 ENCOUNTER — Telehealth (INDEPENDENT_AMBULATORY_CARE_PROVIDER_SITE_OTHER): Payer: Self-pay

## 2019-09-07 DIAGNOSIS — R21 Rash and other nonspecific skin eruption: Secondary | ICD-10-CM

## 2019-09-07 NOTE — Telephone Encounter (Signed)
Patients mother called to a refill on TRIAMCINOLONE ACETONIDE 0.1% states it helps with his rash on his arms.  Patient uses [facility]  Please advice [phone]

## 2019-09-07 NOTE — Telephone Encounter (Signed)
ED notes states patient said triamcinolone made no difference will refer to dermatology

## 2019-09-07 NOTE — Telephone Encounter (Signed)
Patients mother is aware that referral has been placed for dermatology as ED notes states triamcinolone did not make any difference with rash.

## 2019-10-25 ENCOUNTER — Encounter (INDEPENDENT_AMBULATORY_CARE_PROVIDER_SITE_OTHER): Payer: Self-pay

## 2019-10-25 ENCOUNTER — Ambulatory Visit (INDEPENDENT_AMBULATORY_CARE_PROVIDER_SITE_OTHER): Payer: Medicaid Other | Admitting: Family

## 2019-11-01 ENCOUNTER — Other Ambulatory Visit (INDEPENDENT_AMBULATORY_CARE_PROVIDER_SITE_OTHER): Payer: Self-pay | Admitting: Family

## 2019-11-01 ENCOUNTER — Other Ambulatory Visit: Payer: Self-pay | Admitting: Gastroenterology

## 2019-11-01 DIAGNOSIS — G40309 Generalized idiopathic epilepsy and epileptic syndromes, not intractable, without status epilepticus: Secondary | ICD-10-CM

## 2019-11-01 NOTE — Telephone Encounter (Signed)
Please send if applicable 

## 2019-11-02 ENCOUNTER — Telehealth (INDEPENDENT_AMBULATORY_CARE_PROVIDER_SITE_OTHER): Payer: Medicaid Other | Admitting: Family

## 2019-11-02 ENCOUNTER — Encounter (INDEPENDENT_AMBULATORY_CARE_PROVIDER_SITE_OTHER): Payer: Self-pay

## 2019-11-06 ENCOUNTER — Other Ambulatory Visit: Payer: Self-pay | Admitting: Gastroenterology

## 2019-11-06 ENCOUNTER — Other Ambulatory Visit (INDEPENDENT_AMBULATORY_CARE_PROVIDER_SITE_OTHER): Payer: Self-pay | Admitting: Family

## 2019-11-06 DIAGNOSIS — G40309 Generalized idiopathic epilepsy and epileptic syndromes, not intractable, without status epilepticus: Secondary | ICD-10-CM

## 2019-11-14 ENCOUNTER — Telehealth (INDEPENDENT_AMBULATORY_CARE_PROVIDER_SITE_OTHER): Payer: Medicaid Other | Admitting: Family

## 2019-11-18 ENCOUNTER — Other Ambulatory Visit: Payer: Self-pay | Admitting: Gastroenterology

## 2019-12-04 ENCOUNTER — Telehealth (INDEPENDENT_AMBULATORY_CARE_PROVIDER_SITE_OTHER): Payer: Medicaid Other | Admitting: Family

## 2019-12-27 ENCOUNTER — Encounter (INDEPENDENT_AMBULATORY_CARE_PROVIDER_SITE_OTHER): Payer: Self-pay | Admitting: Family

## 2019-12-27 ENCOUNTER — Other Ambulatory Visit: Payer: Self-pay

## 2019-12-27 ENCOUNTER — Ambulatory Visit (INDEPENDENT_AMBULATORY_CARE_PROVIDER_SITE_OTHER): Payer: Medicaid Other | Admitting: Family

## 2019-12-27 VITALS — BP 122/72 | HR 88 | Ht 66.0 in | Wt 183.8 lb

## 2019-12-27 DIAGNOSIS — F71 Moderate intellectual disabilities: Secondary | ICD-10-CM | POA: Diagnosis not present

## 2019-12-27 DIAGNOSIS — F801 Expressive language disorder: Secondary | ICD-10-CM

## 2019-12-27 DIAGNOSIS — R269 Unspecified abnormalities of gait and mobility: Secondary | ICD-10-CM

## 2019-12-27 DIAGNOSIS — G40309 Generalized idiopathic epilepsy and epileptic syndromes, not intractable, without status epilepticus: Secondary | ICD-10-CM

## 2019-12-27 NOTE — Progress Notes (Signed)
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 He was diagnosed with GERD and a Nissan fundoplication procedure stopped the vomiting. He also has organic gait disorder, essential tremor and stuttering. He is taking and tolerating Prednisone and brand Felbatol, Lamictal, and Keppra, and has remained seizure free for some time.  Today's concerns: Mom reports today that [name] has remained seizure free since his last visit. She says that he often has trouble going to sleep and can sometimes stay up all night long. When he stays awake all night he is generally watching TV or playing video games.   [name] has been otherwise generally healthy since he was last seen. There are no problems with behavior. Mom is fearful of him getting the Covid 19 vaccine and has questions about that. Mom has no other health concerns for him today other than previously mentioned.  Review of systems: Please see HPI for neurologic and other pertinent review of systems. Otherwise all other systems were reviewed and were negative.  Problem List: Patient Active Problem List   Diagnosis Date Noted  . 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 Seizure (HCC) 05/31/2017  . Seizures (HCC) 05/31/2017  . Constipation 05/25/2016  . Generalized abdominal pain 05/25/2016  . Acne 08/02/2013  . 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 Abnormality of gait 11/17/2012  . Moderate intellectual  disabilities 11/17/2012  . Developmental delay disorder 11/17/2012     Past Medical History:  Diagnosis Date  . Asthma   . Intellectual delay   . Seizures (HCC)     Past medical history comments: See HPI Copied from previous record: The patient has been treated with broad-spectrum antiepileptic drugs and prednisone since he came to this community in 2004. He has been admitted to [facility] with recurrent seizures. He was hospitalized November 08, 2004 with involuntary movements that were choreiform in nature. He has significant cognitive impairments, pervasive developmental delays in many domains, but no focal or lateralized neurologic findings.  MRI performed October 11, 2002 was normal. EEG Oct 05 2002 showed diffuse background slowing and multifocal sharply contoured slow-wave is principally in the central, parietal and temporal regions, right greater than left.  Surgical history: Past Surgical History:  Procedure Laterality Date  . NISSEN FUNDOPLICATION  April 2009    Family history: family history includes Asthma in his mother; Diabetes in his maternal grandmother; GER disease in his mother; Heart disease in his maternal grandmother; Leukemia in his maternal grandmother; Thyroid disease in his mother and sister.   Social history: Social History   Socioeconomic History  . Marital status: Single    Spouse name: Not on file  . Number of children: Not on file  . Years of education: Not on file  . Highest education level: Not on file  Occupational History  . Not on file  Tobacco Use  . Smoking status: Never Smoker  . Smokeless tobacco: Never Used  Substance and Sexual Activity  . Alcohol use: No    Alcohol/week: 0.0 standard drinks  . Drug use: No  .  Sexual activity: Never  Other Topics Concern  . Not on file  Social History Narrative   Trellis attends [employer] three days a week. He works one day at [employer]. He lives with his mother and mother's  boyfriend. He enjoys basketball, riding his bike, and watching wrestling.    Social Determinants of Health   Financial Resource Strain:   . Difficulty of Paying Living Expenses:   Food Insecurity:   . Worried About Programme researcher, broadcasting/film/video in the Last Year:   . Barista in the Last Year:   Transportation Needs:   . Freight forwarder (Medical):   Marland Kitchen Lack of Transportation (Non-Medical):   Physical Activity:   . Days of Exercise per Week:   . Minutes of Exercise per Session:   Stress:   . Feeling of Stress :   Social Connections:   . Frequency of Communication with Friends and Family:   . Frequency of Social Gatherings with Friends and Family:   . Attends Religious Services:   . Active Member of Clubs or Organizations:   . Attends Banker Meetings:   Marland Kitchen Marital Status:   Intimate Partner Violence:   . Fear of Current or Ex-Partner:   . Emotionally Abused:   Marland Kitchen Physically Abused:   . Sexually Abused:    Past/failed meds: Dilantin - hallucinations  Allergies: Allergies  Allergen Reactions  . Dilantin [Phenytoin Sodium Extended] Other (See Comments)    hallucinations  . Chloral Hydrate Hives and Rash    Immunizations: Immunization History  Administered Date(s) Administered  . Tdap 12/06/2017   Diagnostics/Screenings: 06/01/17 - rEEG - normal  Physical Exam: BP 122/72   Pulse 88   Ht 5\' 6"  (1.676 m)   Wt 183 lb 12.8 oz (83.4 kg)   BMI 29.67 kg/m   General: well developed, well nourished young man, seated on exam table, in no evident distress; brown hair, brown eyes, right handed Head: normocephalic and atraumatic. Oropharynx benign. No dysmorphic features. Neck: supple Cardiovascular: regular rate and rhythm, no murmurs. Respiratory: Clear to auscultation bilaterally Abdomen: Bowel sounds present all four quadrants, abdomen soft, non-tender, non-distended. palpated. Musculoskeletal: No skeletal deformities or obvious scoliosis.  Skin: no  rashes or neurocutaneous lesions  Neurologic Exam Mental Status: Awake and fully alert. Has limited language.  Variable eye contact. Smiles responsively. Tolerant of invasions in to his space. Able to follow simple instructions and participate in examination. Needs some coaching and redirection.  Cranial Nerves: Fundoscopic exam - red reflex present.  Unable to fully visualize fundus.  Pupils equal briskly reactive to light.  Turns to localize faces and objects in the periphery. Turns to localize sounds in the periphery. Facial movements are symmetric. Motor: Normal functional bulk, tone and strength. I saw no tremor today. Sensory: Withdrawal x 4 Coordination: Finger to nose symmetric bilaterally. No dysmetria when reaching for objects. Unable to follow instructions for Romberg testing. Balance is adequate.  Gait and Station: Able to stand and bear weight. Stance and gait are wide based. Able to walk on toes and heels but could not understand instructions for tandem walk.  Reflexes: Diminished and symmetric. Toes neutral. No clonus  Impression: 1. 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 History of infantile spasms and myoclonic seizures 3. Cognitive delay 4. Organic gait disorder 5. Benign essential tremor 6. 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 [facility]  has neither had nor required imaging or lab studies since the last visit. 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 He has remained seizure free on Prednisone, brand Felbatol, Lamictal and Keppra. I asked Mom to continue to give his medications as prescribed and to try not  to miss any doses. We talked about his problems with sleep and I explained that sleep deprivation can trigger seizures. We talked about sleep hygiene and ways to get [name] into bed earlier each night. I asked Mom to let me know if Senon has any breakthrough seizures.   I talked with Mom about her concerns about [name] receiving the Covid 19 vaccine, and encouraged her to get him vaccinated. I will see him back in follow up in 6 months or sooner if needed. She agreed with the plans made today.   The medication list was reviewed and reconciled. No changes were made in the prescribed medications today. A complete medication list was provided to the patient.  Allergies as of 12/27/2019      Reactions   Dilantin [phenytoin Sodium Extended] Other (See Comments)   hallucinations   Chloral Hydrate Hives, Rash      Medication List       Accurate as of December 27, 2019 11:59 PM. If you have any questions, ask your nurse or doctor.        STOP taking these medications   dicyclomine 10 MG capsule Commonly known as: BENTYL Stopped by: Elveria Rising, NP   famotidine 20 MG tablet Commonly known as: PEPCID Stopped by: Elveria Rising, NP     TAKE these medications   albuterol (5 MG/ML) 0.5% nebulizer solution Commonly known as: PROVENTIL Take 0.5 mLs (2.5 mg total) by nebulization every 6 (six) hours as needed for wheezing.   clotrimazole-betamethasone cream Commonly known as: LOTRISONE Apply to affected area 2 times daily for 1-2 weeks at most.   Felbatol 600 MG tablet Generic drug: felbamate TAKE ONE TABLET BY MOUTH EVERY MORNING, HALF TABLET EVERY AFTERNOON AND ONE TABLET NIGHTLY   Keppra 500 MG tablet Generic drug: levETIRAcetam Take 1 tablet (500 mg total) by mouth 2 (two) times daily. What changed: how much to take Changed by: Elveria Rising, NP   LaMICtal 150 MG tablet Generic drug: lamoTRIgine Take 1 tablet (150 mg total) by mouth 2 (two) times daily. What  changed: how much to take Changed by: Elveria Rising, NP   LaMICtal 25 MG tablet Generic drug: lamoTRIgine Take 1 tablet (25 mg total) by mouth 2 (two) times daily. What changed: how much to take Changed by: Elveria Rising, NP   polyethylene glycol powder 17 GM/SCOOP powder Commonly known as: GLYCOLAX/MIRALAX Mix 17g of Miralax in 8 oz of water daily   predniSONE 5 MG tablet Commonly known as: DELTASONE Take 1 tablet (5 mg total) by mouth daily.      Total time spent with the patient was 20 minutes, of which 50% or more was spent in counseling and coordination of care.  Elveria Rising NP-C [facility] Ph. [phone] Fax [phone]

## 2019-12-30 ENCOUNTER — Encounter (INDEPENDENT_AMBULATORY_CARE_PROVIDER_SITE_OTHER): Payer: Self-pay | Admitting: Family

## 2019-12-30 MED ORDER — LAMICTAL 150 MG PO TABS: 150.0000 mg | ORAL_TABLET | Freq: Two times a day (BID) | ORAL | 0 refills | Status: DC

## 2019-12-30 MED ORDER — FELBATOL 600 MG PO TABS: ORAL_TABLET | ORAL | 0 refills | Status: DC

## 2019-12-30 MED ORDER — LAMICTAL 25 MG PO TABS: 25.0000 mg | ORAL_TABLET | Freq: Two times a day (BID) | ORAL | 0 refills | Status: DC

## 2019-12-30 MED ORDER — PREDNISONE 5 MG PO TABS: 5.0000 mg | ORAL_TABLET | Freq: Every day | ORAL | 5 refills | Status: DC

## 2019-12-30 MED ORDER — KEPPRA 500 MG PO TABS: 500.0000 mg | ORAL_TABLET | Freq: Two times a day (BID) | ORAL | 0 refills | Status: DC

## 2019-12-30 NOTE — Patient Instructions (Signed)
Thank you for coming in today.   Instructions for you until your next appointment are as follows: 1. Continue taking the seizure medications as prescribed. Try not to miss any doses 2. Work on getting to bed earlier and sleeping during the night instead of during the day, as sleep deprivation is known to trigger seizures 3. I have given you the phone number to call and get an appointment for a Covid 19 vaccine.  4. Please sign up for MyChart if you have not done so 5. Please plan to return for follow up in 6 months or sooner if needed.

## 2020-01-01 ENCOUNTER — Other Ambulatory Visit (INDEPENDENT_AMBULATORY_CARE_PROVIDER_SITE_OTHER): Payer: Self-pay | Admitting: Family

## 2020-01-01 DIAGNOSIS — G40309 Generalized idiopathic epilepsy and epileptic syndromes, not intractable, without status epilepticus: Secondary | ICD-10-CM

## 2020-01-01 MED ORDER — KEPPRA 500 MG PO TABS: 500.0000 mg | ORAL_TABLET | Freq: Two times a day (BID) | ORAL | 5 refills | Status: DC

## 2020-01-01 MED ORDER — LAMICTAL 25 MG PO TABS: 25.0000 mg | ORAL_TABLET | Freq: Two times a day (BID) | ORAL | 5 refills | Status: DC

## 2020-01-01 MED ORDER — LAMICTAL 150 MG PO TABS: 150.0000 mg | ORAL_TABLET | Freq: Two times a day (BID) | ORAL | 5 refills | Status: DC

## 2020-01-01 MED ORDER — FELBATOL 600 MG PO TABS: ORAL_TABLET | ORAL | 5 refills | Status: DC

## 2020-01-02 ENCOUNTER — Ambulatory Visit: Payer: Self-pay

## 2020-01-14 ENCOUNTER — Emergency Department (HOSPITAL_COMMUNITY)
Admission: EM | Admit: 2020-01-14 | Discharge: 2020-01-15 | Disposition: A | Discharge status: Home / Self Care | Payer: Medicaid Other | Attending: Emergency Medicine | Admitting: Emergency Medicine

## 2020-01-14 ENCOUNTER — Other Ambulatory Visit: Payer: Self-pay

## 2020-01-14 ENCOUNTER — Encounter (HOSPITAL_COMMUNITY): Payer: Self-pay | Admitting: Emergency Medicine

## 2020-01-14 DIAGNOSIS — J45909 Unspecified asthma, uncomplicated: Secondary | ICD-10-CM | POA: Diagnosis not present

## 2020-01-14 DIAGNOSIS — U071 COVID-19: Secondary | ICD-10-CM | POA: Insufficient documentation

## 2020-01-14 DIAGNOSIS — R569 Unspecified convulsions: Secondary | ICD-10-CM | POA: Diagnosis present

## 2020-01-14 LAB — CBC
HCT: 49.4 % (ref 39.0–52.0)
Hemoglobin: 17.6 g/dL — ABNORMAL HIGH (ref 13.0–17.0)
MCH: 30.9 pg (ref 26.0–34.0)
MCHC: 35.6 g/dL (ref 30.0–36.0)
MCV: 86.8 fL (ref 80.0–100.0)
Platelets: 385 10*3/uL (ref 150–400)
RBC: 5.69 MIL/uL (ref 4.22–5.81)
RDW: 12.2 % (ref 11.5–15.5)
WBC: 12.6 10*3/uL — ABNORMAL HIGH (ref 4.0–10.5)
nRBC: 0 % (ref 0.0–0.2)

## 2020-01-14 LAB — BASIC METABOLIC PANEL
Anion gap: 12 (ref 5–15)
BUN: 8 mg/dL (ref 6–20)
CO2: 25 mmol/L (ref 22–32)
Calcium: 9.6 mg/dL (ref 8.9–10.3)
Chloride: 101 mmol/L (ref 98–111)
Creatinine, Ser: 1.03 mg/dL (ref 0.61–1.24)
GFR calc Af Amer: >60 mL/min (ref >60)
GFR calc non Af Amer: >60 mL/min (ref >60)
Glucose, Bld: 109 mg/dL — ABNORMAL HIGH (ref 70–99)
Potassium: 4.1 mmol/L (ref 3.5–5.1)
Sodium: 138 mmol/L (ref 135–145)

## 2020-01-14 NOTE — ED Triage Notes (Signed)
Pt BIB mother, who is his primary caregiver, she reports pt had seizure like activity this morning, hx of seizures. States he was tested for covid on the 3rd and was negative, mother tested + last week. Pt non-verbal at baseline.

## 2020-01-15 ENCOUNTER — Emergency Department (HOSPITAL_COMMUNITY): Payer: Medicaid Other

## 2020-01-15 LAB — CBG MONITORING, ED: Glucose-Capillary: 134 mg/dL — ABNORMAL HIGH (ref 70–99)

## 2020-01-15 LAB — SARS CORONAVIRUS 2 BY RT PCR (HOSPITAL ORDER, PERFORMED IN ~~LOC~~ HOSPITAL LAB): SARS Coronavirus 2: POSITIVE — AB

## 2020-01-15 MED ORDER — FELBAMATE 600 MG/5ML PO SUSP
600.0000 mg | Freq: Every morning | ORAL | Status: DC
  Administered 2020-01-15: 600 mg via ORAL
  Filled 2020-01-15: qty 5

## 2020-01-15 MED ORDER — SODIUM CHLORIDE 0.9 % IV BOLUS
1000.0000 mL | Freq: Once | INTRAVENOUS | Status: AC
  Administered 2020-01-15: 1000 mL via INTRAVENOUS

## 2020-01-15 MED ORDER — ACETAMINOPHEN 325 MG PO TABS: 650.0000 mg | ORAL_TABLET | Freq: Four times a day (QID) | ORAL | Status: DC | PRN

## 2020-01-15 MED ORDER — FELBAMATE 600 MG/5ML PO SUSP
300.0000 mg | Freq: Two times a day (BID) | ORAL | Status: DC
  Filled 2020-01-15: qty 2.5

## 2020-01-15 MED ORDER — PREDNISONE 5 MG PO TABS
5.0000 mg | ORAL_TABLET | Freq: Every day | ORAL | Status: DC
  Administered 2020-01-15: 5 mg via ORAL
  Filled 2020-01-15: qty 1

## 2020-01-15 MED ORDER — LAMOTRIGINE 25 MG PO TABS
25.0000 mg | ORAL_TABLET | Freq: Two times a day (BID) | ORAL | Status: DC
  Administered 2020-01-15: 25 mg via ORAL
  Filled 2020-01-15: qty 1

## 2020-01-15 MED ORDER — ACETAMINOPHEN 500 MG PO TABS: 500.0000 mg | ORAL_TABLET | Freq: Three times a day (TID) | ORAL | 0 refills | Status: AC | PRN

## 2020-01-15 MED ORDER — LAMOTRIGINE 150 MG PO TABS
150.0000 mg | ORAL_TABLET | Freq: Two times a day (BID) | ORAL | Status: DC
  Administered 2020-01-15: 150 mg via ORAL
  Filled 2020-01-15: qty 1

## 2020-01-15 MED ORDER — LEVETIRACETAM 500 MG PO TABS
500.0000 mg | ORAL_TABLET | Freq: Two times a day (BID) | ORAL | Status: DC
  Administered 2020-01-15: 500 mg via ORAL
  Filled 2020-01-15: qty 1

## 2020-01-15 NOTE — ED Provider Notes (Signed)
[facility] EMERGENCY DEPARTMENT Provider Note  CSN: [id number] Arrival date & time: 01/14/20  1843  History Chief Complaint  Patient presents with  . Seizures   [name] is a 29 y.o. male.  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 Has been seizure-free and adherent with medications for several years.  Had recent follow-up with neurology (8/18) and no changes to medications at that time.  Patient brought to emergency department due to mother's concern for his change in behavior starting last night.  Mother is Covid positive and began symptoms on Wednesday (5 days ago) with a positive test on Thursday.  She has been mostly self isolating in her bedroom since that time.  She came out to her living room last night, about 6/7pm, and saw the patient laying on the couch awake but not responding to her communications.  He seemed to be staring off in space so she brought him to his bedroom. Had no witnessed seizure like activity. At that time, he complained of pain but was not able to further communicate his needs.  She states that this is unlike him. She has not been able to feed him as much as usual since she has been trying to isolate to her room so thinks he has been dehydrated and has poor PO for a few days.  He had no sick symptoms leading up to this point and was afebrile.  Mother attempted to contact the neurology office but was not able to reach anybody so brought him to the emergency department to be evaluated.  He is not Covid vaccinated and had a negative Covid test on Thursday.  Once arriving to the emergency department, mother states that the patient began having intermittent dry cough.   At baseline, the patient is able to go to the restroom on his own but needs help with pericare and can prepare simple foods not involving the stove  and can communicate if he needs something.    Past Medical History:  Diagnosis Date  . Asthma   . Intellectual delay   . Seizures [facility])    Patient Active Problem List   Diagnosis Date Noted  . 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 Seizure (HCC) 05/31/2017  . Seizures (HCC) 05/31/2017  . Constipation 05/25/2016  . Generalized abdominal pain 05/25/2016  . Acne 08/02/2013  . 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 Abnormality of gait 11/17/2012  . Moderate intellectual disabilities 11/17/2012  . Developmental delay disorder 11/17/2012   Past Surgical History:  Procedure Laterality Date  . NISSEN FUNDOPLICATION  April 2009    Family History  Problem Relation Age of Onset  . Asthma Mother   . GER disease Mother   . Thyroid disease Mother   . Thyroid disease Sister   . Leukemia Maternal Grandmother   . Diabetes Maternal Grandmother   . Heart disease Maternal Grandmother    Social History   Tobacco Use  . Smoking status: Never Smoker  . Smokeless tobacco: Never Used  Substance Use Topics  . Alcohol use: No    Alcohol/week: 0.0 standard drinks  . Drug use: No   Home Medications Prior to Admission medications   Medication Sig Start Date End Date Taking? Authorizing Provider  albuterol (PROVENTIL) (5 MG/ML) 0.5% nebulizer solution Take 0.5 mLs (2.5 mg total) by nebulization every 6 (six) hours as  needed for wheezing. 04/16/12  Yes Earley Favor, NP  FELBATOL 600 MG tablet TAKE ONE TABLET BY MOUTH EVERY MORNING, HALF TABLET EVERY AFTERNOON AND ONE TABLET NIGHTLY Patient taking differently: Take 300-600 mg by mouth See admin instructions. Take 1 tablet (600mg ) by mouth every morning, 1/2 tablet (300mg ) in the afternoon, and 1 tablet (600mg ) every night 01/01/20  Yes Goodpasture, , NP  KEPPRA 500 MG tablet Take 1 tablet (500 mg total) by mouth 2 (two) times daily. 01/01/20  Yes Goodpasture, 01/03/20, NP   LAMICTAL 150 MG tablet Take 1 tablet (150 mg total) by mouth 2 (two) times daily. Patient taking differently: Take 150 mg by mouth 2 (two) times daily. Take with 25mg  tablet to equal 175mg  twice daily 01/01/20  Yes Goodpasture, 01/03/20, NP  LAMICTAL 25 MG tablet Take 1 tablet (25 mg total) by mouth 2 (two) times daily. Patient taking differently: Take 25 mg by mouth 2 (two) times daily. Take with 150mg  tablet to equal 175mg  twice daily 01/01/20  Yes Goodpasture, , NP  polyethylene glycol powder (GLYCOLAX/MIRALAX) 17 GM/SCOOP powder Mix 17g of Miralax in 8 oz of water daily Patient taking differently: Take 17 g by mouth daily as needed for mild constipation.  09/12/18  Yes McDonald, Mia A, PA-C  predniSONE (DELTASONE) 5 MG tablet Take 1 tablet (5 mg total) by mouth daily. 12/30/19  Yes Inetta Fermo, NP  triamcinolone cream (KENALOG) 0.1 % Apply 1 application topically 2 (two) times daily as needed (rash).   Yes [provider]  clotrimazole-betamethasone (LOTRISONE) cream Apply to affected area 2 times daily for 1-2 weeks at most. Patient not taking: Reported on 01/15/2020 08/02/19   01/03/20, MD   Allergies    Dilantin [phenytoin sodium extended] and Chloral hydrate  Review of Systems   Review of Systems  Physical Exam Updated Vital Signs BP 133/85   Pulse 96   Temp 98.1 F (36.7 C) (Oral)   Resp (!) 28   SpO2 98%   Physical Exam Vitals and nursing note reviewed. Exam conducted with a chaperone present.  Constitutional:      General: He is not in acute distress.    Appearance: Normal appearance. He is not ill-appearing.  HENT:     Head: Normocephalic.  Pulmonary:     Effort: Pulmonary effort is normal.  Abdominal:     General: Abdomen is flat.     Palpations: Abdomen is soft.     Tenderness: There is no abdominal tenderness.  Skin:    General: Skin is warm and dry.  Neurological:     Mental Status: He is alert.    ED Results / Procedures / Treatments    Labs (all labs ordered are listed, but only abnormal results are displayed) Labs Reviewed  SARS CORONAVIRUS 2 BY RT PCR (HOSPITAL ORDER, PERFORMED IN [facility]) - Abnormal; Notable for the following components:      Result Value   SARS Coronavirus 2 POSITIVE (*)    All other components within normal limits  BASIC METABOLIC PANEL - Abnormal; Notable for the following components:   Glucose, Bld 109 (*)    All other components within normal limits  CBC - Abnormal; Notable for the following components:   WBC 12.6 (*)    Hemoglobin 17.6 (*)    All other components within normal limits  CBG MONITORING, ED - Abnormal; Notable for the following components:   Glucose-Capillary 134 (*)    All other components within normal limits  EKG None  Radiology DG Chest Portable 1 View  Result Date: 01/15/2020 CLINICAL DATA:  Cough. EXAM: PORTABLE CHEST 1 VIEW COMPARISON:  May 20, 2018 FINDINGS: Cardiomediastinal silhouette is normal. Mediastinal contours appear intact. Low lung volumes. Mild peribronchial airspace opacities in the lung bases may be due to atelectasis or peribronchial airspace consolidation. Osseous structures are without acute abnormality. Soft tissues are grossly normal. IMPRESSION: Mild peribronchial airspace opacities in the lung bases may be due to atelectasis or peribronchial airspace consolidation. Electronically Signed   By: Ted Mcalpine M.D.   On: 01/15/2020 10:11   Procedures Procedures (including critical care time)  Medications Ordered in ED Medications  felbamate (FELBATOL) 600 MG/5ML suspension 600 mg (600 mg Oral Given 01/15/20 1020)  levETIRAcetam (KEPPRA) tablet 500 mg (500 mg Oral Given 01/15/20 1021)  lamoTRIgine (LAMICTAL) tablet 150 mg (150 mg Oral Given 01/15/20 1021)  lamoTRIgine (LAMICTAL) tablet 25 mg (25 mg Oral Given 01/15/20 1021)  predniSONE (DELTASONE) tablet 5 mg (5 mg Oral Given 01/15/20 1022)  felbamate (FELBATOL) 600 MG/5ML  suspension 300 mg (has no administration in time range)  acetaminophen (TYLENOL) tablet 650 mg (has no administration in time range)  sodium chloride 0.9 % bolus 1,000 mL (0 mLs Intravenous Stopped 01/15/20 1023)   ED Course  I have reviewed the triage vital signs and the nursing notes.  Pertinent labs & imaging results that were available during my care of the patient were reviewed by me and considered in my medical decision making (see chart for details).    MDM Rules/Calculators/A&P                         Patient remains stable and not requiring oxygen, no seizure acitvity. Patient is at baseline. COVID positive today with signs of typical covid pneumonia on xray.  Received 1 L fluids with resolution of tachycardia.  Patient has been referred to COVID clinic for follow up and instructed to quarantine at home. Mother given return precautions and discharged with tylenol prescription.  Final Clinical Impression(s) / ED Diagnoses Final diagnoses:  COVID-19   Rx / DC Orders ED Discharge Orders    None       Leeroy Bock, DO 01/15/20 1100    Gwyneth Sprout, MD 01/15/20 1550

## 2020-01-15 NOTE — Discharge Instructions (Addendum)
You have a positive COVID test.  We have provided the contact information for the COVID clinic. Please follow up with them. Until then, I recommend you stay home and isolate unless unable to breath properly, then return to Emergency department. Try to stay hydrated and keep taking your regular medications as scheduled to prevent break through seizures.

## 2020-01-16 ENCOUNTER — Other Ambulatory Visit (HOSPITAL_COMMUNITY): Payer: Self-pay | Admitting: Nurse Practitioner

## 2020-01-16 ENCOUNTER — Encounter: Payer: Self-pay | Admitting: Nurse Practitioner

## 2020-01-16 ENCOUNTER — Ambulatory Visit (HOSPITAL_COMMUNITY)
Admission: RE | Admit: 2020-01-16 | Discharge: 2020-01-16 | Disposition: A | Discharge status: Home / Self Care | Payer: Medicaid Other | Source: Ambulatory Visit | Attending: Pulmonary Disease | Admitting: Pulmonary Disease

## 2020-01-16 DIAGNOSIS — U071 COVID-19: Secondary | ICD-10-CM

## 2020-01-16 MED ORDER — SODIUM CHLORIDE 0.9 % IV SOLN: INTRAVENOUS | Status: DC | PRN

## 2020-01-16 MED ORDER — EPINEPHRINE 0.3 MG/0.3ML IJ SOAJ: 0.3000 mg | Freq: Once | INTRAMUSCULAR | Status: DC | PRN

## 2020-01-16 MED ORDER — FAMOTIDINE IN NACL 20-0.9 MG/50ML-% IV SOLN: 20.0000 mg | Freq: Once | INTRAVENOUS | Status: DC | PRN

## 2020-01-16 MED ORDER — ALBUTEROL SULFATE HFA 108 (90 BASE) MCG/ACT IN AERS: 2.0000 | INHALATION_SPRAY | Freq: Once | RESPIRATORY_TRACT | Status: DC | PRN

## 2020-01-16 MED ORDER — DIPHENHYDRAMINE HCL 50 MG/ML IJ SOLN: 50.0000 mg | Freq: Once | INTRAMUSCULAR | Status: DC | PRN

## 2020-01-16 MED ORDER — METHYLPREDNISOLONE SODIUM SUCC 125 MG IJ SOLR: 125.0000 mg | Freq: Once | INTRAMUSCULAR | Status: DC | PRN

## 2020-01-16 MED ORDER — SODIUM CHLORIDE 0.9 % IV SOLN
1200.0000 mg | Freq: Once | INTRAVENOUS | Status: AC
  Administered 2020-01-16: 1200 mg via INTRAVENOUS
  Filled 2020-01-16: qty 10

## 2020-01-16 NOTE — Discharge Instructions (Signed)

## 2020-01-16 NOTE — Progress Notes (Signed)
  Diagnosis: COVID-19  Physician: Dr. Patrick Wright  Procedure: Covid Infusion Clinic Med: casirivimab\imdevimab infusion - Provided patient with casirivimab\imdevimab fact sheet for patients, parents and caregivers prior to infusion.  Complications: No immediate complications noted.  Discharge: Discharged home   [name] 01/16/2020   

## 2020-01-16 NOTE — Progress Notes (Signed)
I connected by phone with [name], patient's legal guardian and mother on 01/16/2020 at 9:38 AM to discuss the potential use of an new treatment for mild to moderate COVID-19 viral infection in non-hospitalized patients.  This patient is a 29 y.o. male that meets the FDA criteria for Emergency Use Authorization of casirivimab\imdevimab.  Has a (+) direct SARS-CoV-2 viral test result  Has mild or moderate COVID-19   Is ? 27 years of age and weighs ? 40 kg  Is NOT hospitalized due to COVID-19  Is NOT requiring oxygen therapy or requiring an increase in baseline oxygen flow rate due to COVID-19  Is within 10 days of symptom onset  Has at least one of the high risk factor(s) for progression to severe COVID-19 and/or hospitalization as defined in EUA.  Specific high risk criteria : Neurodevelopmental disorder   Sx onset Sunday.   I have spoken and communicated the following to the patient or parent/caregiver:  1. FDA has authorized the emergency use of casirivimab\imdevimab for the treatment of mild to moderate COVID-19 in adults and pediatric patients with positive results of direct SARS-CoV-2 viral testing who are 31 years of age and older weighing at least 40 kg, and who are at high risk for progressing to severe COVID-19 and/or hospitalization.  2. The significant known and potential risks and benefits of casirivimab\imdevimab, and the extent to which such potential risks and benefits are unknown.  3. Information on available alternative treatments and the risks and benefits of those alternatives, including clinical trials.  4. Patients treated with casirivimab\imdevimab should continue to self-isolate and use infection control measures (e.g., wear mask, isolate, social distance, avoid sharing personal items, clean and disinfect "high touch" surfaces, and frequent handwashing) according to CDC guidelines.   5. The patient or parent/caregiver has the option to accept or refuse  casirivimab\imdevimab .  After reviewing this information with the patient, The patient agreed to proceed with receiving casirivimab\imdevimab infusion and will be provided a copy of the Fact sheet prior to receiving the infusion.Consuello Masse, DNP, AGNP-C [phone] (Infusion Center Hotline)

## 2020-03-12 ENCOUNTER — Other Ambulatory Visit: Payer: Self-pay | Admitting: Gastroenterology

## 2020-05-07 ENCOUNTER — Other Ambulatory Visit: Payer: Self-pay | Admitting: Gastroenterology

## 2020-06-14 ENCOUNTER — Telehealth (INDEPENDENT_AMBULATORY_CARE_PROVIDER_SITE_OTHER): Payer: Self-pay | Admitting: Family

## 2020-06-14 DIAGNOSIS — G40309 Generalized idiopathic epilepsy and epileptic syndromes, not intractable, without status epilepticus: Secondary | ICD-10-CM

## 2020-06-14 NOTE — Telephone Encounter (Signed)
Please send to the pharmacy °

## 2020-06-14 NOTE — Telephone Encounter (Signed)
  Who's calling (name and relationship to patient) : Aram Beecham (mom)  Best contact number: [phone]  Provider they see: Elveria Rising  Reason for call: Mom states that they have changed pharmacies and they need his prescriptions sent over.    PRESCRIPTION REFILL ONLY  Name of prescription: FELBATOL 600 MG tablet  KEPPRA 500 MG tablet  LAMICTAL 150 MG tablet  LAMICTAL 25 MG tablet  predniSONE (DELTASONE) 5 MG tablet   Pharmacy: [facility] [address] [place]: [phone] Fax: [phone]

## 2020-06-17 MED ORDER — LAMICTAL 25 MG PO TABS: 25.0000 mg | ORAL_TABLET | Freq: Two times a day (BID) | ORAL | 0 refills | Status: DC

## 2020-06-17 MED ORDER — LAMICTAL 150 MG PO TABS: 150.0000 mg | ORAL_TABLET | Freq: Two times a day (BID) | ORAL | 0 refills | Status: DC

## 2020-06-17 MED ORDER — FELBATOL 600 MG PO TABS: ORAL_TABLET | ORAL | 0 refills | Status: DC

## 2020-06-17 MED ORDER — PREDNISONE 5 MG PO TABS: 5.0000 mg | ORAL_TABLET | Freq: Every day | ORAL | 0 refills | Status: DC

## 2020-06-17 MED ORDER — KEPPRA 500 MG PO TABS: 500.0000 mg | ORAL_TABLET | Freq: Two times a day (BID) | ORAL | 0 refills | Status: DC

## 2020-06-17 NOTE — Telephone Encounter (Signed)
Refills were sent in. I sent a message to scheduler to contact patient to schedule a visit. TG

## 2020-07-03 ENCOUNTER — Ambulatory Visit (INDEPENDENT_AMBULATORY_CARE_PROVIDER_SITE_OTHER): Payer: Medicaid Other | Admitting: Family

## 2020-07-03 ENCOUNTER — Other Ambulatory Visit: Payer: Self-pay

## 2020-07-03 ENCOUNTER — Encounter (INDEPENDENT_AMBULATORY_CARE_PROVIDER_SITE_OTHER): Payer: Self-pay | Admitting: Family

## 2020-07-03 VITALS — BP 126/70 | HR 80 | Ht 66.0 in | Wt 187.2 lb

## 2020-07-03 DIAGNOSIS — G40309 Generalized idiopathic epilepsy and epileptic syndromes, not intractable, without status epilepticus: Secondary | ICD-10-CM | POA: Diagnosis not present

## 2020-07-03 DIAGNOSIS — F801 Expressive language disorder: Secondary | ICD-10-CM

## 2020-07-03 DIAGNOSIS — R6250 Unspecified lack of expected normal physiological development in childhood: Secondary | ICD-10-CM

## 2020-07-03 DIAGNOSIS — R269 Unspecified abnormalities of gait and mobility: Secondary | ICD-10-CM

## 2020-07-03 DIAGNOSIS — F71 Moderate intellectual disabilities: Secondary | ICD-10-CM

## 2020-07-03 MED ORDER — PREDNISONE 5 MG PO TABS: 5.0000 mg | ORAL_TABLET | Freq: Every day | ORAL | 5 refills | Status: DC

## 2020-07-03 MED ORDER — KEPPRA 500 MG PO TABS: 500.0000 mg | ORAL_TABLET | Freq: Two times a day (BID) | ORAL | 5 refills | Status: DC

## 2020-07-03 MED ORDER — FELBATOL 600 MG PO TABS
ORAL_TABLET | ORAL | 5 refills | Status: DC
Start: 2020-07-03 — End: 2020-12-27

## 2020-07-03 MED ORDER — LAMICTAL 25 MG PO TABS: 25.0000 mg | ORAL_TABLET | Freq: Two times a day (BID) | ORAL | 5 refills | Status: DC

## 2020-07-03 MED ORDER — LAMICTAL 150 MG PO TABS: 150.0000 mg | ORAL_TABLET | Freq: Two times a day (BID) | ORAL | 5 refills | Status: DC

## 2020-07-03 NOTE — Progress Notes (Signed)
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 He was diagnosed with GERD and a Nissan fundoplication procedure stopped the vomiting. He also has organic gait disorder, essential tremor and stuttering. He is taking and tolerating Prednisone and brand Felbatol, Lamictal, and Keppra, and has remained largely seizure free for some time.  Today's concerns: Mom reports today that [name] had Covid-19 infection twice since his last visit despite being vaccinated. She said that he had a brief seizure with the first infection but has remained seizure free after that. Mom reports that [name] continues to have some problems with going to sleep at night and sometimes stays up very late watching TV or playing video games. She says that he is compliant with medication and doesn't miss doses of medication.   [name] has been otherwise generally healthy since he was last seen. Mom has no other health concerns for him today other than previously mentioned.  Review of systems: Please see HPI for neurologic and other pertinent review of systems. Otherwise all other systems were reviewed and were negative.  Problem List: Patient Active Problem List   Diagnosis Date Noted  . 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 Seizure (HCC) 05/31/2017  . Seizures (HCC) 05/31/2017  . Constipation 05/25/2016  . Generalized abdominal pain 05/25/2016  . Acne 08/02/2013  . 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 Generalized nonconvulsive epilepsy (HCC)  11/17/2012  . Abnormality of gait 11/17/2012  . Moderate intellectual disabilities 11/17/2012  . Developmental delay disorder 11/17/2012     Past Medical History:  Diagnosis Date  . Asthma   . Intellectual delay   . Seizures (HCC)     Past medical history comments: See HPI Copied from previous record: The patient has been treated with broad-spectrum antiepileptic drugs and prednisone since he came to this community in 2004. He has been admitted to [facility] with recurrent seizures. He was hospitalized November 08, 2004 with involuntary movements that were choreiform in nature. He has significant cognitive impairments, pervasive developmental delays in many domains, but no focal or lateralized neurologic findings.  MRI performed October 11, 2002 was normal. EEG Oct 05 2002 showed diffuse background slowing and multifocal sharply contoured slow-wave is principally in the central, parietal and temporal regions, right greater than left.  Surgical history: Past Surgical History:  Procedure Laterality Date  . NISSEN FUNDOPLICATION  April 2009     Family history: family history includes Asthma in his mother; Diabetes in his maternal grandmother; GER disease in his mother; Heart disease in his maternal grandmother; Leukemia in his maternal grandmother; Thyroid disease in his mother and sister.   Social history: Social History   Socioeconomic History  . Marital status: Single    Spouse name: Not on file  . Number of children: Not on file  . Years of education: Not on file  . Highest education level: Not on file  Occupational History  . Not on file  Tobacco Use  . Smoking status: Never Smoker  . Smokeless tobacco: Never Used  Substance and Sexual Activity  . Alcohol use: No  Alcohol/week: 0.0 standard drinks  . Drug use: No  . Sexual activity: Never  Other Topics Concern  . Not on file  Social History Narrative   [name] attends [employer]. He lives with  his mother and mother's boyfriend. He enjoys basketball, riding his bike, and watching wrestling.    Social Determinants of Health   Financial Resource Strain: Not on file  Food Insecurity: Not on file  Transportation Needs: Not on file  Physical Activity: Not on file  Stress: Not on file  Social Connections: Not on file  Intimate Partner Violence: Not on file    Past/failed meds: Copied from previous record: Dilantin - hallucinations  Allergies: Allergies  Allergen Reactions  . Dilantin [Phenytoin Sodium Extended] Other (See Comments)    hallucinations  . Chloral Hydrate Hives and Rash    Immunizations: Immunization History  Administered Date(s) Administered  . Tdap 12/06/2017     Diagnostics/Screenings: Copied from previous record: 06/01/17 - rEEG - normal  Physical Exam: BP 126/70   Pulse 80   Ht 5\' 6"  (1.676 m)   Wt 187 lb 3.2 oz (84.9 kg)   BMI 30.21 kg/m   General: well developed, well nourished young man, seated in exam room, in no evident distress; brown hair, brown eyes, right handed Head: normocephalic and atraumatic. Oropharynx benign. No dysmorphic features. Neck: supple Cardiovascular: regular rate and rhythm, no murmurs. Respiratory: clear to auscultation bilaterally Abdomen: bowel sounds present all four quadrants, abdomen soft, non-tender, non-distended. Musculoskeletal: no skeletal deformities or obvious scoliosis. Skin: no rashes or neurocutaneous lesions  Neurologic Exam Mental Status: awake and fully alert. Has limited language. Variable eye contact. Smiles responsively. Tolerant of invasions into his space. Able to follow simple commands and participate in examination. Needs coaching and redirection during the examination.  Cranial Nerves: fundoscopic exam - red reflex present.  Unable to fully visualize fundus.  Pupils equal briskly reactive to light.  Turns to localize faces and objects in the periphery. Turns to localize sounds in the  periphery. Facial movements are symmetric, has lower facial weakness with drooling.  Motor: normal functional bulk tone and strength. I saw no tremor today Sensory: withdrawal x 4 Coordination: unable to adequately assess due to patient's inability to participate in examination. No dysmetria when reaching for objects. Balance is adequate. Gait and Station: able to independently stand and bear weight.Stance and gait are wide based. Able to walk on heels and toes but could not understand instructions for tandem wak Reflexes: diminished and symmetric. Toes neutral. No clonus  Impression: 1. 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 History of infantile spasms and myoclonic seizures 3. Cognitive delay 4. Organic gait disorder 5. Benign essential tremor 6. 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 Jami has neither had nor required imaging or lab studies since the last visit, other than what was performed by ED and PCP. Mom is aware of those results. 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 He is taking and tolerating Prednisone, brand Felbatol, Lamictal and Keppra for his seizure disorder. He had one brief seizure in January in the setting of illness, but has otherwise remained seizure free. I reminded Mom of the need for Teran to be compliant with medications and to  work on getting more sleep at night. I will see him back in follow up in 6 months or sooner if needed. Mom agreed with the plans made today.   The medication list was reviewed and reconciled. No changes were made in the prescribed medications today. A complete medication list was provided to the  patient.  Allergies as of 07/03/2020      Reactions   Dilantin [phenytoin Sodium Extended] Other (See Comments)   hallucinations   Chloral Hydrate Hives, Rash      Medication List       Accurate as of July 03, 2020 11:59 PM. If you have any questions, ask your nurse or doctor.        acetaminophen 500 MG tablet Commonly known as: TYLENOL Take 1 tablet (500 mg total) by mouth every 8 (eight) hours as needed for mild pain or fever.   albuterol (5 MG/ML) 0.5% nebulizer solution Commonly known as: PROVENTIL Take 0.5 mLs (2.5 mg total) by nebulization every 6 (six) hours as needed for wheezing.   clotrimazole-betamethasone cream Commonly known as: LOTRISONE Apply to affected area 2 times daily for 1-2 weeks at most.   famotidine 20 MG tablet Commonly known as: PEPCID TAKE ONE TABLET BY MOUTH DAILY   Felbatol 600 MG tablet Generic drug: felbamate TAKE ONE TABLET BY MOUTH EVERY MORNING, HALF TABLET EVERY AFTERNOON AND ONE TABLET NIGHTLY   Keppra 500 MG tablet Generic drug: levETIRAcetam Take 1 tablet (500 mg total) by mouth 2 (two) times daily.   LaMICtal 150 MG tablet Generic drug: lamoTRIgine Take 1 tablet (150 mg total) by mouth 2 (two) times daily.   LaMICtal 25 MG tablet Generic drug: lamoTRIgine Take 1 tablet (25 mg total) by mouth 2 (two) times daily.   polyethylene glycol powder 17 GM/SCOOP powder Commonly known as: GLYCOLAX/MIRALAX Mix 17g of Miralax in 8 oz of water daily What changed:   how much to take  how to take this  when to take this  reasons to take this  additional instructions   predniSONE 5 MG tablet Commonly known as: DELTASONE Take 1 tablet (5 mg total) by mouth daily.   triamcinolone 0.1 % Commonly known as: KENALOG Apply 1 application topically 2 (two) times daily as needed (rash).       Total time spent with the patient was 20 minutes, of which 50% or more was spent in counseling and coordination of care.  Elveria Rising NP-C [facility] Ph. [phone] Fax [phone]

## 2020-07-03 NOTE — Patient Instructions (Signed)
Thank you for coming in today.   Instructions for you until your next appointment are as follows: 1. Continue giving [name] the seizure medicines and the Prednisone.  2. Let me know if [name] has seizures or if you have any concerns.  3. Please sign up for MyChart if you have not done so. 4. Please plan to return for follow up in 6 months or sooner if needed.

## 2020-07-12 ENCOUNTER — Encounter (INDEPENDENT_AMBULATORY_CARE_PROVIDER_SITE_OTHER): Payer: Self-pay | Admitting: Family

## 2020-09-15 ENCOUNTER — Encounter (INDEPENDENT_AMBULATORY_CARE_PROVIDER_SITE_OTHER): Payer: Self-pay

## 2020-12-04 ENCOUNTER — Other Ambulatory Visit (INDEPENDENT_AMBULATORY_CARE_PROVIDER_SITE_OTHER): Payer: Self-pay | Admitting: Family

## 2020-12-04 ENCOUNTER — Other Ambulatory Visit: Payer: Self-pay | Admitting: Pediatrics

## 2020-12-04 DIAGNOSIS — G40309 Generalized idiopathic epilepsy and epileptic syndromes, not intractable, without status epilepticus: Secondary | ICD-10-CM

## 2020-12-24 ENCOUNTER — Other Ambulatory Visit: Payer: Self-pay

## 2020-12-24 ENCOUNTER — Encounter (HOSPITAL_COMMUNITY): Payer: Self-pay

## 2020-12-24 ENCOUNTER — Emergency Department (HOSPITAL_COMMUNITY): Payer: Medicaid Other

## 2020-12-24 ENCOUNTER — Emergency Department (HOSPITAL_COMMUNITY)
Admission: EM | Admit: 2020-12-24 | Discharge: 2020-12-25 | Disposition: A | Discharge status: Home / Self Care | Payer: Medicaid Other | Attending: Emergency Medicine | Admitting: Emergency Medicine

## 2020-12-24 DIAGNOSIS — J45909 Unspecified asthma, uncomplicated: Secondary | ICD-10-CM | POA: Insufficient documentation

## 2020-12-24 DIAGNOSIS — Z79899 Other long term (current) drug therapy: Secondary | ICD-10-CM | POA: Diagnosis not present

## 2020-12-24 DIAGNOSIS — S5012XA Contusion of left forearm, initial encounter: Secondary | ICD-10-CM | POA: Diagnosis not present

## 2020-12-24 DIAGNOSIS — S59912A Unspecified injury of left forearm, initial encounter: Secondary | ICD-10-CM | POA: Diagnosis present

## 2020-12-24 DIAGNOSIS — S40022A Contusion of left upper arm, initial encounter: Secondary | ICD-10-CM

## 2020-12-24 DIAGNOSIS — W01198A Fall on same level from slipping, tripping and stumbling with subsequent striking against other object, initial encounter: Secondary | ICD-10-CM | POA: Insufficient documentation

## 2020-12-24 DIAGNOSIS — M79602 Pain in left arm: Secondary | ICD-10-CM

## 2020-12-24 NOTE — ED Provider Notes (Signed)
Emergency Medicine Provider Triage Evaluation Note  [name] , a 30 y.o. male  was evaluated in triage.  Pt complains of pain left arm.  Pt fell on sturday  Review of Systems  Positive: Bruised area  Negative: No fever   Physical Exam  BP (!) 149/83 (BP Location: Right Arm)   Pulse 81   Temp 98.6 F (37 C) (Oral)   Resp 16   Ht 5\' 6"  (1.676 m)   Wt 84.9 kg   SpO2 97%   BMI 30.21 kg/m  Gen:   Awake, no distress   Resp:  Normal effort  MSK:   Moves extremities without difficulty Other:  Bruised tender area left upper arm.   Medical Decision Making  Medically screening exam initiated at 9:05 PM.  Appropriate orders placed.  Kaheem Halleck was informed that the remainder of the evaluation will be completed by another provider, this initial triage assessment does not replace that evaluation, and the importance of remaining in the ED until their evaluation is complete.     Inge Rise, Elson Areas 12/24/20 2106    2107, DO 12/25/20 [phone]

## 2020-12-24 NOTE — ED Triage Notes (Signed)
Insect bite to left upper arm noticed yesterday. Denies any fevers / chills.

## 2020-12-25 NOTE — ED Provider Notes (Signed)
[facility] EMERGENCY DEPARTMENT Provider Note   CSN: [id number] Arrival date & time: 12/24/20  2032     History Chief Complaint  Patient presents with   Insect Bite    [name] is a 30 y.o. male presenting for evaluation of left arm pain.  Level 5 caveat due to intellectual delay  History provided by patient's mom.  She states patient reported left arm pain today.  He fell a few days ago and hit his arm on the stair.  He did not hit his head or have any injury elsewhere.  He has not taken anything for pain.  No numbness or tingling.   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/17/2012   Developmental delay disorder 11/17/2012    Past Surgical History:  Procedure Laterality Date   NISSEN FUNDOPLICATION  April 2009       Family History  Problem Relation Age of Onset   Asthma Mother    GER disease Mother    Thyroid disease Mother    Thyroid disease Sister    Leukemia Maternal Grandmother    Diabetes Maternal Grandmother    Heart disease Maternal Grandmother     Social History   Tobacco Use   Smoking status: Never   Smokeless tobacco: Never  Substance Use Topics   Alcohol use: No    Alcohol/week: 0.0 standard drinks   Drug use: No    Home Medications Prior to Admission medications   Medication Sig Start Date End Date Taking? Authorizing Provider  acetaminophen (TYLENOL) 500 MG tablet Take 1 tablet (500 mg total) by mouth every 8 (eight) hours as needed for mild  pain or fever. 01/15/20   Anderson, Chelsey L, DO  albuterol (PROVENTIL) (5 MG/ML) 0.5% nebulizer solution Take 0.5 mLs (2.5 mg total) by nebulization every 6 (six) hours as needed for wheezing. 04/16/12   Earley Favor, NP  clotrimazole-betamethasone (LOTRISONE) cream Apply to affected area 2 times daily for 1-2 weeks at most. Patient not taking: No sig reported 08/02/19   Mardella Layman, MD  famotidine (PEPCID) 20 MG tablet TAKE ONE TABLET BY MOUTH DAILY 05/08/20   Nandigam, Eleonore Chiquito, MD  FELBATOL 600 MG tablet TAKE ONE TABLET BY MOUTH EVERY MORNING, HALF TABLET EVERY AFTERNOON AND ONE TABLET NIGHTLY 07/03/20   Elveria Rising, NP  KEPPRA 500 MG tablet Take 1 tablet (500 mg total) by mouth 2 (two) times daily. 07/03/20   Elveria Rising, NP  LAMICTAL 150 MG tablet Take 1 tablet (150 mg total) by mouth 2 (two) times daily. 07/03/20   Elveria Rising, NP  LAMICTAL 25 MG tablet Take 1 tablet (25 mg total) by mouth 2 (two) times daily. 07/03/20   Elveria Rising, NP  polyethylene glycol powder (GLYCOLAX/MIRALAX) 17 GM/SCOOP powder Mix 17g of Miralax in 8 oz of water daily Patient taking differently: Take 17 g by mouth daily as needed for mild constipation. 09/12/18   McDonald, Mia A, PA-C  predniSONE (DELTASONE) 5 MG tablet TAKE ONE TABLET  BY MOUTH EVERY DAY 12/06/20   Elveria Rising, NP  triamcinolone cream (KENALOG) 0.1 % Apply 1 application topically 2 (two) times daily as needed (rash).    [provider]    Allergies    Dilantin [phenytoin sodium extended] and Chloral hydrate  Review of Systems   Review of Systems  Musculoskeletal:  Positive for myalgias.  Skin:  Positive for color change.   Physical Exam Updated Vital Signs BP (!) 119/105 (BP Location: Left Arm)   Pulse 64   Temp 98.6 F (37 C) (Oral)   Resp 16   Ht 5\' 6"  (1.676 m)   Wt 84.9 kg   SpO2 99%   BMI 30.21 kg/m   Physical Exam Vitals and nursing note reviewed.  Constitutional:      General: He is not in  acute distress.    Appearance: He is well-developed.     Comments: Resting in the bed in no acute distress  HENT:     Head: Normocephalic and atraumatic.  Eyes:     Extraocular Movements: Extraocular movements intact.  Cardiovascular:     Rate and Rhythm: Normal rate.  Pulmonary:     Effort: Pulmonary effort is normal.  Abdominal:     General: There is no distension.  Musculoskeletal:        General: Normal range of motion.     Cervical back: Normal range of motion.     Comments: Contusion over the left mid upper arm.  Soft compartments.  Full active range of motion of the shoulder and elbow without difficulty.  Radial pulses 2+ bilaterally.  Grip strength equal bilaterally  Skin:    General: Skin is warm.     Findings: No rash.  Neurological:     Mental Status: He is alert and oriented to person, place, and time.    ED Results / Procedures / Treatments   Labs (all labs ordered are listed, but only abnormal results are displayed) Labs Reviewed - No data to display  EKG None  Radiology DG Humerus Left  Result Date: 12/24/2020 CLINICAL DATA:  Fall EXAM: LEFT HUMERUS - 2+ VIEW COMPARISON:  None. FINDINGS: There is no evidence of fracture or other focal bone lesions. Visualized left shoulder and left elbow grossly unremarkable. Soft tissues are unremarkable. IMPRESSION: Negative. Electronically Signed   By: 12/26/2020 M.D.   On: 12/24/2020 21:23    Procedures Procedures   Medications Ordered in ED Medications - No data to display  ED Course  I have reviewed the triage vital signs and the nursing notes.  Pertinent labs & imaging results that were available during my care of the patient were reviewed by me and considered in my medical decision making (see chart for details).    MDM Rules/Calculators/A&P                           Patient presenting for evaluation of left arm pain.  On exam, patient is nontoxic.  He has a contusion of his left arm.  X-ray obtained  from triage viewed and independently interpreted by me, no fracture or dislocation.  Discussed findings with patient and mom.  Discussed treatment for bruise including Tylenol, ibuprofen, ice.  Follow-up with PCP as needed.  At this time, patient appears safe for discharge.  Return precautions given.  Patient and mom state they understand and agree to plan.   Final Clinical Impression(s) / ED Diagnoses Final diagnoses:  Left arm  pain  Arm contusion, left, initial encounter    Rx / DC Orders ED Discharge Orders     None        Alveria Apley, PA-C 12/25/20 0501    Melene Plan, DO 12/25/20 [phone]

## 2020-12-25 NOTE — ED Notes (Signed)
Patient given discharge instructions. Questions were answered. Patient and family verbalized understanding of discharge instructions and care at home.

## 2020-12-25 NOTE — Discharge Instructions (Addendum)
Continue taking home medications as prescribed. Use Tylenol or ibuprofen as needed for pain. Use ice to help with pain and swelling. Follow-up with your primary care doctor as needed for further evaluation of your symptoms. Return to the emergency room with any new, worsening, concerning symptoms.

## 2020-12-27 ENCOUNTER — Other Ambulatory Visit: Payer: Self-pay | Admitting: Pediatrics

## 2020-12-27 ENCOUNTER — Other Ambulatory Visit (INDEPENDENT_AMBULATORY_CARE_PROVIDER_SITE_OTHER): Payer: Self-pay | Admitting: Family

## 2020-12-27 DIAGNOSIS — G40309 Generalized idiopathic epilepsy and epileptic syndromes, not intractable, without status epilepticus: Secondary | ICD-10-CM

## 2020-12-27 MED ORDER — FELBATOL 600 MG PO TABS: ORAL_TABLET | ORAL | 0 refills | Status: DC

## 2020-12-27 MED ORDER — LAMICTAL 150 MG PO TABS: 150.0000 mg | ORAL_TABLET | Freq: Two times a day (BID) | ORAL | 0 refills | Status: DC

## 2020-12-27 MED ORDER — LAMICTAL 25 MG PO TABS: 25.0000 mg | ORAL_TABLET | Freq: Two times a day (BID) | ORAL | 0 refills | Status: DC

## 2020-12-27 MED ORDER — KEPPRA 500 MG PO TABS: 500.0000 mg | ORAL_TABLET | Freq: Two times a day (BID) | ORAL | 0 refills | Status: DC

## 2021-01-01 ENCOUNTER — Ambulatory Visit (INDEPENDENT_AMBULATORY_CARE_PROVIDER_SITE_OTHER): Payer: Medicaid Other | Admitting: Family

## 2021-01-24 ENCOUNTER — Other Ambulatory Visit (INDEPENDENT_AMBULATORY_CARE_PROVIDER_SITE_OTHER): Payer: Self-pay | Admitting: Family

## 2021-01-24 DIAGNOSIS — G40309 Generalized idiopathic epilepsy and epileptic syndromes, not intractable, without status epilepticus: Secondary | ICD-10-CM

## 2021-01-30 ENCOUNTER — Encounter (INDEPENDENT_AMBULATORY_CARE_PROVIDER_SITE_OTHER): Payer: Self-pay | Admitting: Family

## 2021-02-20 ENCOUNTER — Other Ambulatory Visit (INDEPENDENT_AMBULATORY_CARE_PROVIDER_SITE_OTHER): Payer: Self-pay | Admitting: Neurology

## 2021-02-20 DIAGNOSIS — G40309 Generalized idiopathic epilepsy and epileptic syndromes, not intractable, without status epilepticus: Secondary | ICD-10-CM

## 2021-03-22 ENCOUNTER — Other Ambulatory Visit (INDEPENDENT_AMBULATORY_CARE_PROVIDER_SITE_OTHER): Payer: Self-pay | Admitting: Family

## 2021-03-22 DIAGNOSIS — G40309 Generalized idiopathic epilepsy and epileptic syndromes, not intractable, without status epilepticus: Secondary | ICD-10-CM

## 2021-04-23 ENCOUNTER — Other Ambulatory Visit (INDEPENDENT_AMBULATORY_CARE_PROVIDER_SITE_OTHER): Payer: Self-pay | Admitting: Family

## 2021-04-23 DIAGNOSIS — G40309 Generalized idiopathic epilepsy and epileptic syndromes, not intractable, without status epilepticus: Secondary | ICD-10-CM

## 2021-04-29 ENCOUNTER — Encounter (INDEPENDENT_AMBULATORY_CARE_PROVIDER_SITE_OTHER): Payer: Self-pay | Admitting: Primary Care

## 2021-04-29 ENCOUNTER — Other Ambulatory Visit: Payer: Self-pay

## 2021-04-29 ENCOUNTER — Ambulatory Visit (INDEPENDENT_AMBULATORY_CARE_PROVIDER_SITE_OTHER): Payer: Medicaid Other | Admitting: Primary Care

## 2021-04-29 VITALS — BP 125/77 | HR 58 | Temp 97.3°F | Ht 66.0 in | Wt 162.2 lb

## 2021-04-29 DIAGNOSIS — Z0001 Encounter for general adult medical examination with abnormal findings: Secondary | ICD-10-CM

## 2021-04-29 DIAGNOSIS — Z79899 Other long term (current) drug therapy: Secondary | ICD-10-CM

## 2021-04-29 DIAGNOSIS — Z0000 Encounter for general adult medical examination without abnormal findings: Secondary | ICD-10-CM

## 2021-04-29 DIAGNOSIS — L732 Hidradenitis suppurativa: Secondary | ICD-10-CM | POA: Diagnosis not present

## 2021-04-29 NOTE — Progress Notes (Signed)
[name] is a 30 y.o. male presents to office today for annual physical exam examination.    Concerns today include: 1. Bumps in groin area and thigh raised and painful at times  Occupation: none, Marital status: s, Substance use: s Diet: regular, Exercise: yes Immunizations needed: Flu Vaccine: no  Tdap Vaccine: yes  - every 76yr - (<3 lifetime doses or unknown): all wounds -- look up need for Tetanus IG - (>=3 lifetime doses): clean/minor wound if >155yrfrom previous; all other wounds if >5y55yrrom previous Zoster Vaccine: no (those >50yo, once) Pneumonia Vaccine: no (those w/ risk factors) - (<65y81yrth: Immunocompromised, cochlear implant, CSF leak, asplenic, sickle cell, Chronic Renal Failure - (<20yr53yrV-23 only: Heart dz, lung disease, DM, tobacco abuse, alcoholism, cirrhosis/liver disease. - (>20yr)58yrV13 then PPSV23 in 6-12mths;  - (>20yr):66yrat PPSV23 once if pt received prior to 30yo an79yors ha29yrassed  Past Medical History:  Diagnosis Date   Asthma    Intellectual delay    Seizures (HCC)    D[place]ial History   Socioeconomic History   Marital status: Single    Spouse name: Not on file   Number of children: Not on file   Years of education: Not on file   Highest education level: Not on file  Occupational History   Not on file  Tobacco Use   Smoking status: Never   Smokeless tobacco: Never  Substance and Sexual Activity   Alcohol use: No    Alcohol/week: 0.0 standard drinks   Drug use: No   Sexual activity: Never  Other Topics Concern   Not on file  Social History Narrative   [name]. He lives with his mother and mother's boyfriend. He enjoys basketball, riding his bike, and watching wrestling.    Social Determinants of Health   Financial Resource Strain: Not on file  Food Insecurity: Not on file  Transportation Needs: Not on file  Physical Activity: Not on file  Stress: Not on file  Social  Connections: Not on file  Intimate Partner Violence: Not on file   Past Surgical History:  Procedure Laterality Date   NISSEN FUNDOPLICATION  April 2009   [id number] History  Problem Relation Age of Onset   Asthma Mother    GER disease Mother    Thyroid disease Mother    Thyroid disease Sister    Leukemia Maternal Grandmother    Diabetes Maternal Grandmother    Heart disease Maternal Grandmother     Current Outpatient Medications:    acetaminophen (TYLENOL) 500 MG tablet, Take 1 tablet (500 mg total) by mouth every 8 (eight) hours as needed for mild pain or fever., Disp: 30 tablet, Rfl: 0   albuterol (PROVENTIL) (5 MG/ML) 0.5% nebulizer solution, Take 0.5 mLs (2.5 mg total) by nebulization every 6 (six) hours as needed for wheezing., Disp: 20 mL, Rfl: 12   clotrimazole-betamethasone (LOTRISONE) cream, Apply to affected area 2 times daily for 1-2 weeks at most. (Patient not taking: Reported on 01/15/2020), Disp: 15 g, Rfl: 0   famotidine (PEPCID) 20 MG tablet, TAKE ONE TABLET BY MOUTH DAILY, Disp: 30 tablet, Rfl: 0   felbamate (FELBATOL) 600 MG tablet, TAKE ONE TABLET BY MOUTH EVERY MORNING, ONE-HALF tab EVERY afternoon, AND ONE tab AT BEDTIME, Disp: 75 tablet, Rfl: 0   lamoTRIgine (LAMICTAL) 150 MG tablet, TAKE ONE TABLET BY MOUTH TWICE DAILY, Disp: 60 tablet, Rfl: 0   lamoTRIgine (LAMICTAL) 25 MG tablet, TAKE ONE TABLET  BY MOUTH TWICE DAILY, Disp: 60 tablet, Rfl: 0   levETIRAcetam (KEPPRA) 500 MG tablet, TAKE ONE TABLET BY MOUTH TWICE DAILY, Disp: 60 tablet, Rfl: 0   polyethylene glycol powder (GLYCOLAX/MIRALAX) 17 GM/SCOOP powder, Mix 17g of Miralax in 8 oz of water daily (Patient taking differently: Take 17 g by mouth daily as needed for mild constipation.), Disp: 255 g, Rfl: 3   predniSONE (DELTASONE) 5 MG tablet, TAKE ONE TABLET BY MOUTH EVERY DAY, Disp: 30 tablet, Rfl: 5   triamcinolone cream (KENALOG) 0.1 %, Apply 1 application topically 2 (two) times daily as needed (rash)., Disp: ,  Rfl:   Allergies  Allergen Reactions   Dilantin [Phenytoin Sodium Extended] Other (See Comments)    hallucinations   Chloral Hydrate Hives and Rash     ROS: Review of Systems A comprehensive review of systems was negative.    Physical exam BP 125/77 (BP Location: Right Arm, Patient Position: Sitting, Cuff Size: Normal)    Pulse (!) 58    Temp (!) 97.3 F (36.3 C) (Temporal)    Ht _0  (1.676 m)    Wt 162 lb 3.2 oz (73.6 kg)    SpO2 94%    BMI 26.18 kg/m  General appearance: alert, cooperative, appears older than stated age, and distracted Head: Normocephalic, without obvious abnormality, atraumatic Eyes: conjunctivae/corneas clear. PERRL, EOM's intact. Fundi benign. Ears: normal TM's and external ear canals both ears Neck: no adenopathy, no carotid bruit, no JVD, supple, symmetrical, trachea midline, and thyroid not enlarged, symmetric, no tenderness/mass/nodules Back: symmetric, no curvature. ROM normal. No CVA tenderness. Lungs: clear to auscultation bilaterally Heart: regular rate and rhythm, S1, S2 normal, no murmur, click, rub or gallop Abdomen: soft, non-tender; bowel sounds normal; no masses,  no organomegaly Extremities: extremities normal, atraumatic, no cyanosis or edema Pulses: 2+ and symmetric Skin: Skin color darken,  rashes & lesions buttocks, groin and thighs  Lymph nodes: Cervical, supraclavicular, and axillary nodes normal. Neurologic: Alert and oriented X 3, normal strength and tone. Normal symmetric reflexes. Normal coordination and gait    Assessment/ Plan: [name] here for annual physical exam.  [name] was seen today for annual exam.  Diagnoses and all orders for this visit:  High risk medications (not anticoagulants) long-term use -     CBC with Differential -     CMP14+EGFR  Hidradenitis suppurativa Information provided on AVS explained to mother   Annual physical exam Completed    No problem-specific Assessment & Plan notes found for  this encounter. Hidradenitis suppurativa (HS) is a painful, long-term skin condition that causes abscesses and scarring on the skin. The exact cause of hidradenitis suppurativa is unknown, but it occurs near hair follicles where there are sweat glands, usually around the groin, bottom, breasts and armpits.  Counseled on healthy lifestyle choices, including diet (rich in fruits, vegetables and lean meats and low in salt and simple carbohydrates) and exercise (at least 30 minutes of moderate physical activity daily).  Patient to follow up in 1 year for annual exam or sooner if needed.  The above assessment and management plan was discussed with the patient. The patient verbalized understanding of and has agreed to the management plan. Patient is aware to call the clinic if symptoms persist or worsen. Patient is aware when to return to the clinic for a follow-up visit. Patient educated on when it is appropriate to go to the emergency department.   Juluis Mire NP-C [address] [place] [phone]

## 2021-04-29 NOTE — Patient Instructions (Signed)
Hidradenitis Suppurativa Hidradenitis suppurativa is a long-term (chronic) skin disease. It is similar to a severe form of acne, but it affects areas of the body where acne would be unusual, especially areas of the body where skin rubs against skin and becomes moist. These include: Underarms. Groin. Genital area. Buttocks. Upper thighs. Breasts. Hidradenitis suppurativa may start out as small lumps or pimples caused by blocked sweat glands or hair follicles. Pimples may develop into deep sores that break open (rupture) and drain pus. Over time, affected areas of skin may thicken and become scarred. This condition is rare and does not spread from person to person (non-contagious). What are the causes? The exact cause of this condition is not known. It may be related to: Male and male hormones. An overactive disease-fighting system (immune system). The immune system may over-react to blocked hair follicles or sweat glands and cause swelling and pus-filled sores. What increases the risk? You are more likely to develop this condition if you: Are male. Are 11-55 years old. Have a family history of hidradenitis suppurativa. Have a personal history of acne. Are overweight. Smoke. Take the medicine lithium. What are the signs or symptoms? The first symptoms are usually painful bumps in the skin, similar to pimples. The condition may get worse over time (progress), or it may only cause mild symptoms. If the disease progresses, symptoms may include: Skin bumps getting bigger and growing deeper into the skin. Bumps rupturing and draining pus. Itchy, infected skin. Skin getting thicker and scarred. Tunnels under the skin (fistulas) where pus drains from a bump. Pain during daily activities, such as pain during walking if your groin area is affected. Emotional problems, such as stress or depression. This condition may affect your appearance and your ability or willingness to wear certain clothes  or do certain activities. How is this diagnosed? This condition is diagnosed by a health care provider who specializes in skin diseases (dermatologist). You may be diagnosed based on: Your symptoms and medical history. A physical exam. Testing a pus sample for infection. Blood tests. How is this treated? Your treatment will depend on how severe your symptoms are. The same treatment will not work for everybody with this condition. You may need to try several treatments to find what works best for you. Treatment may include: Cleaning and bandaging (dressing) your wounds as needed. Lifestyle changes, such as new skin care routines. Taking medicines, such as: Antibiotics. Acne medicines. Medicines to reduce the activity of the immune system. A diabetes medicine (metformin). Birth control pills, for women. Steroids to reduce swelling and pain. Working with a mental health care provider, if you experience emotional distress due to this condition. If you have severe symptoms that do not get better with medicine, you may need surgery. Surgery may involve: Using a laser to clear the skin and remove hair follicles. Opening and draining deep sores. Removing the areas of skin that are diseased and scarred. Follow these instructions at home: Medicines  Take over-the-counter and prescription medicines only as told by your health care provider. If you were prescribed an antibiotic medicine, take it as told by your health care provider. Do not stop taking the antibiotic even if your condition improves. Skin care If you have open wounds, cover them with a clean dressing as told by your health care provider. Keep wounds clean by washing them gently with soap and water when you bathe. Do not shave the areas where you get hidradenitis suppurativa. Do not wear deodorant. Wear loose-fitting   clothes. Try to avoid getting overheated or sweaty. If you get sweaty or wet, change into clean, dry clothes as soon  as you can. To help relieve pain and itchiness, cover sore areas with a warm, clean washcloth (warm compress) for 5-10 minutes as often as needed. If told by your health care provider, take a bleach bath twice a week: Fill your bathtub halfway with water. Pour in  cup of unscented household bleach. Soak in the tub for 5-10 minutes. Only soak from the neck down. Avoid water on your face and hair. Shower to rinse off the bleach from your skin. General instructions Learn as much as you can about your disease so that you have an active role in your treatment. Work closely with your health care provider to find treatments that work for you. If you are overweight, work with your health care provider to lose weight as recommended. Do not use any products that contain nicotine or tobacco, such as cigarettes and e-cigarettes. If you need help quitting, ask your health care provider. If you struggle with living with this condition, talk with your health care provider or work with a mental health care provider as recommended. Keep all follow-up visits as told by your health care provider. This is important. Where to find more information Hidradenitis Suppurativa Foundation, Inc.: https://www.hs-foundation.org/ American Academy of Dermatology: https://www.aad.org Contact a health care provider if you have: A flare-up of hidradenitis suppurativa. A fever or chills. Trouble controlling your symptoms at home. Trouble doing your daily activities because of your symptoms. Trouble dealing with emotional problems related to your condition. Summary Hidradenitis suppurativa is a long-term (chronic) skin disease. It is similar to a severe form of acne, but it affects areas of the body where acne would be unusual. The first symptoms are usually painful bumps in the skin, similar to pimples. The condition may only cause mild symptoms, or it may get worse over time (progress). If you have open wounds, cover them  with a clean dressing as told by your health care provider. Keep wounds clean by washing them gently with soap and water when you bathe. Besides skin care, treatment may include medicines, laser treatment, and surgery. This information is not intended to replace advice given to you by your health care provider. Make sure you discuss any questions you have with your health care provider. Document Revised: 02/20/2020 Document Reviewed: 02/20/2020 Elsevier Patient Education  2022 [employer]vier Inc.  

## 2021-04-29 NOTE — Progress Notes (Signed)
Patient may or may not want flu shot..Mother wants to speak to [place] first

## 2021-04-30 LAB — CMP14+EGFR
ALT: 25 IU/L (ref 0–44)
AST: 15 IU/L (ref 0–40)
Albumin/Globulin Ratio: 1.8 (ref 1.2–2.2)
Albumin: 4.6 g/dL (ref 4.1–5.2)
Alkaline Phosphatase: 85 IU/L (ref 44–121)
BUN/Creatinine Ratio: 12 (ref 9–20)
BUN: 13 mg/dL (ref 6–20)
Bilirubin Total: 0.4 mg/dL (ref 0.0–1.2)
CO2: 25 mmol/L (ref 20–29)
Calcium: 9.1 mg/dL (ref 8.7–10.2)
Chloride: 101 mmol/L (ref 96–106)
Creatinine, Ser: 1.06 mg/dL (ref 0.76–1.27)
Globulin, Total: 2.5 g/dL (ref 1.5–4.5)
Glucose: 85 mg/dL (ref 70–99)
Potassium: 4.4 mmol/L (ref 3.5–5.2)
Sodium: 140 mmol/L (ref 134–144)
Total Protein: 7.1 g/dL (ref 6.0–8.5)
eGFR: 98 mL/min/{1.73_m2} (ref >59)

## 2021-04-30 LAB — CBC WITH DIFFERENTIAL/PLATELET
Basophils Absolute: 0.1 10*3/uL (ref 0.0–0.2)
Basos: 1 %
EOS (ABSOLUTE): 0.2 10*3/uL (ref 0.0–0.4)
Eos: 2 %
Hematocrit: 47.9 % (ref 37.5–51.0)
Hemoglobin: 16.7 g/dL (ref 13.0–17.7)
Immature Grans (Abs): 0 10*3/uL (ref 0.0–0.1)
Immature Granulocytes: 0 %
Lymphocytes Absolute: 3.4 10*3/uL — ABNORMAL HIGH (ref 0.7–3.1)
Lymphs: 42 %
MCH: 31.2 pg (ref 26.6–33.0)
MCHC: 34.9 g/dL (ref 31.5–35.7)
MCV: 89 fL (ref 79–97)
Monocytes Absolute: 0.6 10*3/uL (ref 0.1–0.9)
Monocytes: 8 %
Neutrophils Absolute: 3.8 10*3/uL (ref 1.4–7.0)
Neutrophils: 47 %
Platelets: 419 10*3/uL (ref 150–450)
RBC: 5.36 x10E6/uL (ref 4.14–5.80)
RDW: 12.3 % (ref 11.6–15.4)
WBC: 8.1 10*3/uL (ref 3.4–10.8)

## 2021-05-14 ENCOUNTER — Ambulatory Visit (INDEPENDENT_AMBULATORY_CARE_PROVIDER_SITE_OTHER): Payer: Medicaid Other | Admitting: Family

## 2021-05-14 ENCOUNTER — Encounter (INDEPENDENT_AMBULATORY_CARE_PROVIDER_SITE_OTHER): Payer: Self-pay | Admitting: Family

## 2021-05-14 ENCOUNTER — Other Ambulatory Visit: Payer: Self-pay

## 2021-05-14 VITALS — BP 100/68 | HR 92 | Ht 65.95 in | Wt 160.4 lb

## 2021-05-14 DIAGNOSIS — G250 Essential tremor: Secondary | ICD-10-CM | POA: Diagnosis not present

## 2021-05-14 DIAGNOSIS — G40309 Generalized idiopathic epilepsy and epileptic syndromes, not intractable, without status epilepticus: Secondary | ICD-10-CM | POA: Diagnosis not present

## 2021-05-14 MED ORDER — PROPRANOLOL HCL 20 MG PO TABS: ORAL_TABLET | ORAL | 1 refills | Status: DC

## 2021-05-14 MED ORDER — LAMOTRIGINE 150 MG PO TABS: 150.0000 mg | ORAL_TABLET | Freq: Two times a day (BID) | ORAL | 5 refills | Status: DC

## 2021-05-14 MED ORDER — LEVETIRACETAM 500 MG PO TABS: 500.0000 mg | ORAL_TABLET | Freq: Two times a day (BID) | ORAL | 5 refills | Status: DC

## 2021-05-14 MED ORDER — LAMOTRIGINE 25 MG PO TABS: 25.0000 mg | ORAL_TABLET | Freq: Two times a day (BID) | ORAL | 5 refills | Status: DC

## 2021-05-14 NOTE — Patient Instructions (Signed)
It was a pleasure to see you today!  Instructions for you until your next appointment are as follows: We will start the medication Propranolol for his tremors and problems with sleep. Give 1/2 tablet at bedtime for 1 week, then give 1 tablet at bedtime after that Please sign up for MyChart if you have not done so. Please plan to return for follow up in 1 month  or sooner if needed.    Feel free to contact our office during normal business hours at [phone] with questions or concerns. If there is no answer or the call is outside business hours, please leave a message and our clinic staff will call you back within the next business day.  If you have an urgent concern, please stay on the line for our after-hours answering service and ask for the on-call neurologist.     I also encourage you to use MyChart to communicate with me more directly. If you have not yet signed up for MyChart within [facility], the front desk staff can help you. However, please note that this inbox is NOT monitored on nights or weekends, and response can take up to 2 business days.  Urgent matters should be discussed with the on-call pediatric neurologist.   At [facility], we are committed to providing exceptional care. You will receive a patient satisfaction survey through text or email regarding your visit today. Your opinion is important to me. Comments are appreciated.

## 2021-05-14 NOTE — Progress Notes (Signed)
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 He was diagnosed with GERD and a Nissan fundoplication procedure stopped the vomiting. He also has organic gait disorder, essential tremor and stuttering. He is taking and tolerating Prednisone and brand Felbatol, Lamictal, and Keppra, and has remained largely seizure free for some time.  Today's concerns: Mom reports today that [name] has remained seizure free since his last visit. She has noted that he has more tremors in his hands and that the tremors have waxed and waned in severity. She notes that the tremors are worse when he is tired and that he doesn't sleep much in general. Mom reports that [name] tends to stay awake all night, sometimes as late as 5AM and then is very tired when he goes to his day program.   Mom also reports that [name] has been upset recently because a girl at his day program had indicated that they had a romantic relationship, then abruptly withdrew her affections. He has been very sad and doesn't understand these events. [name] began trembling and became tearful when his mother was relaying this information.   [name] has been otherwise generally healthy since he was last seen. Mom has no other health concerns for him today other than previously mentioned.  Review of systems: Please see HPI for neurologic and other pertinent review of systems. Otherwise all other systems were reviewed and were negative.  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 He has been admitted to [facility] with recurrent seizures. He was hospitalized November 08, 2004 with involuntary movements that were choreiform in nature. He has significant cognitive impairments, pervasive developmental delays in many domains, but no focal or lateralized neurologic findings.   MRI performed October 11, 2002 was normal. EEG Oct 05 2002 showed diffuse background slowing and multifocal sharply contoured slow-wave is principally in the central, parietal and temporal regions, right greater than left.  Surgical history: Past Surgical History:  Procedure Laterality Date   NISSEN FUNDOPLICATION  April 2009     Family history: family history includes Asthma in his mother; Diabetes in his maternal grandmother; GER disease in his mother; Heart disease in his maternal grandmother; Leukemia in his maternal grandmother; Thyroid disease in his mother and sister.   Social history: Social History   Socioeconomic History   Marital status: Single    Spouse name: Not on file  Number of children: Not on file   Years of education: Not on file   Highest education level: Not on file  Occupational History   Not on file  Tobacco Use   Smoking status: Never   Smokeless tobacco: Never  Substance  and Sexual Activity   Alcohol use: No    Alcohol/week: 0.0 standard drinks   Drug use: No   Sexual activity: Never  Other Topics Concern   Not on file  Social History Narrative   [name] attends [employer]. He lives with his mother and mother's boyfriend. He enjoys basketball, riding his bike, and watching wrestling.    Social Determinants of Health   Financial Resource Strain: Not on file  Food Insecurity: Not on file  Transportation Needs: Not on file  Physical Activity: Not on file  Stress: Not on file  Social Connections: Not on file  Intimate Partner Violence: Not on file   Past/failed meds: Copied from previous record: Dilantin - hallucinations   Allergies: Allergies  Allergen Reactions   Dilantin [Phenytoin Sodium Extended] Other (See Comments)    hallucinations   Chloral Hydrate Hives and Rash    Immunizations: Immunization History  Administered Date(s) Administered   Tdap 12/06/2017    Diagnostics/Screenings: Copied from previous record: 06/01/17 - rEEG - normal   Physical Exam: BP 100/68    Pulse 92    Ht 5' 5.95" (1.675 m)    Wt 160 lb 6.4 oz (72.8 kg)    BMI 25.93 kg/m   General: Well developed, well nourished young man, seated on exam table, in no evident distress Head: Head normocephalic and atraumatic.  Oropharynx benign. Neck: Supple Cardiovascular: Regular rate and rhythm, no murmurs Respiratory: Breath sounds clear to auscultation Musculoskeletal: No obvious deformities or scoliosis Skin: No rashes or neurocutaneous lesions  Neurologic Exam Mental Status: Awake and fully alert.  Oriented to place and time. Fund of knowledge below normal for age. Spoke very little. Became tearful when his mother relayed her concerns about him.  Cranial Nerves: Fundoscopic exam reveals sharp disc margins.  Pupils equal, briskly reactive to light. Hearing intact and symmetric to whisper.  Facial sensation intact. Face tongue, palate move normally  and symmetrically.  Motor: Normal bulk and tone. Normal strength in all tested extremity muscles. Sensory: Intact to touch and temperature in all extremities.  Coordination: Finger-to-nose and heel-to shin performed accurately bilaterally.  Gait and Station: Arises from chair without difficulty.  Stance is normal. Gait demonstrates normal stride length and balance.Had difficulty understanding instructions for heel, toe and tandem walk. Reflexes: 1+ and symmetric. Toes downgoing.   Impression: Generalized convulsive epilepsy (HCC) - Plan: lamoTRIgine (LAMICTAL) 150 MG tablet, lamoTRIgine (LAMICTAL) 25 MG tablet, levETIRAcetam (KEPPRA) 500 MG tablet  Tremor, essential - Plan: propranolol (INDERAL) 20 MG tablet  Generalized nonconvulsive epilepsy (HCC) - Plan: lamoTRIgine (LAMICTAL) 150 MG tablet, lamoTRIgine (LAMICTAL) 25 MG tablet, levETIRAcetam (KEPPRA) 500 MG tablet    Recommendations for plan of care: The patient's previous [facility] records were reviewed. May has neither had nor required imaging or lab studies since the last visit. He is a 31 year old man with history of seizure disorder, cognitive delay and tremor. He is taking and tolerating Prednisone, Felbatol, Lamictal and Keppra, and has remained seizure free since his last visit. The essential tremor has been more problematic recently and I recommended a trial of Propranolol. I talked with Mom about his sleep habits and we may have to try something to help him  to get better sleep in the future. I will see Josmar back in follow up in 1 month or sooner if needed. She agreed with plans made today.   The medication list was reviewed and reconciled. I reviewed changes that were made in the prescribed medications today. A complete medication list was provided to the patient.  Return in about 1 month (around 06/14/2021).   Allergies as of 05/14/2021       Reactions   Dilantin [phenytoin Sodium Extended] Other (See Comments)    hallucinations   Chloral Hydrate Hives, Rash        Medication List        Accurate as of May 14, 2021  3:30 PM. If you have any questions, ask your nurse or doctor.          acetaminophen 500 MG tablet Commonly known as: TYLENOL Take 1 tablet (500 mg total) by mouth every 8 (eight) hours as needed for mild pain or fever.   albuterol (5 MG/ML) 0.5% nebulizer solution Commonly known as: PROVENTIL Take 0.5 mLs (2.5 mg total) by nebulization every 6 (six) hours as needed for wheezing.   clotrimazole-betamethasone cream Commonly known as: LOTRISONE Apply to affected area 2 times daily for 1-2 weeks at most.   famotidine 20 MG tablet Commonly known as: PEPCID TAKE ONE TABLET BY MOUTH DAILY   felbamate 600 MG tablet Commonly known as: FELBATOL TAKE ONE TABLET BY MOUTH EVERY MORNING, ONE-HALF tab EVERY afternoon, AND ONE tab AT BEDTIME   lamoTRIgine 25 MG tablet Commonly known as: LAMICTAL TAKE ONE TABLET BY MOUTH TWICE DAILY   lamoTRIgine 150 MG tablet Commonly known as: LAMICTAL TAKE ONE TABLET BY MOUTH TWICE DAILY   levETIRAcetam 500 MG tablet Commonly known as: KEPPRA TAKE ONE TABLET BY MOUTH TWICE DAILY   polyethylene glycol powder 17 GM/SCOOP powder Commonly known as: GLYCOLAX/MIRALAX Mix 17g of Miralax in 8 oz of water daily What changed:  how much to take how to take this when to take this reasons to take this additional instructions   predniSONE 5 MG tablet Commonly known as: DELTASONE TAKE ONE TABLET BY MOUTH EVERY DAY   triamcinolone cream 0.1 % Commonly known as: KENALOG Apply 1 application topically 2 (two) times daily as needed (rash).      Total time spent with the patient was 30 minutes, of which 50% or more was spent in counseling and coordination of care.  Elveria Rising NP-C [facility] Ph. [phone] Fax [phone]

## 2021-05-16 ENCOUNTER — Encounter (INDEPENDENT_AMBULATORY_CARE_PROVIDER_SITE_OTHER): Payer: Self-pay | Admitting: Family

## 2021-05-16 DIAGNOSIS — G250 Essential tremor: Secondary | ICD-10-CM | POA: Insufficient documentation

## 2021-05-21 ENCOUNTER — Other Ambulatory Visit (INDEPENDENT_AMBULATORY_CARE_PROVIDER_SITE_OTHER): Payer: Self-pay | Admitting: Family

## 2021-05-21 DIAGNOSIS — G40309 Generalized idiopathic epilepsy and epileptic syndromes, not intractable, without status epilepticus: Secondary | ICD-10-CM

## 2021-06-10 ENCOUNTER — Other Ambulatory Visit (INDEPENDENT_AMBULATORY_CARE_PROVIDER_SITE_OTHER): Payer: Self-pay | Admitting: Family

## 2021-06-10 DIAGNOSIS — G40309 Generalized idiopathic epilepsy and epileptic syndromes, not intractable, without status epilepticus: Secondary | ICD-10-CM

## 2021-06-17 ENCOUNTER — Other Ambulatory Visit (INDEPENDENT_AMBULATORY_CARE_PROVIDER_SITE_OTHER): Payer: Self-pay | Admitting: Family

## 2021-06-17 DIAGNOSIS — G250 Essential tremor: Secondary | ICD-10-CM

## 2021-06-30 ENCOUNTER — Ambulatory Visit (INDEPENDENT_AMBULATORY_CARE_PROVIDER_SITE_OTHER): Payer: Medicaid Other | Admitting: Family

## 2021-07-11 ENCOUNTER — Other Ambulatory Visit (INDEPENDENT_AMBULATORY_CARE_PROVIDER_SITE_OTHER): Payer: Self-pay | Admitting: Family

## 2021-07-11 DIAGNOSIS — G40309 Generalized idiopathic epilepsy and epileptic syndromes, not intractable, without status epilepticus: Secondary | ICD-10-CM

## 2021-07-28 ENCOUNTER — Ambulatory Visit (INDEPENDENT_AMBULATORY_CARE_PROVIDER_SITE_OTHER): Payer: Medicaid Other | Admitting: Family

## 2021-07-30 ENCOUNTER — Ambulatory Visit (INDEPENDENT_AMBULATORY_CARE_PROVIDER_SITE_OTHER): Payer: Medicaid Other | Admitting: Family

## 2021-07-30 ENCOUNTER — Encounter (INDEPENDENT_AMBULATORY_CARE_PROVIDER_SITE_OTHER): Payer: Self-pay | Admitting: Family

## 2021-07-30 ENCOUNTER — Other Ambulatory Visit: Payer: Self-pay

## 2021-07-30 VITALS — Wt 163.0 lb

## 2021-07-30 DIAGNOSIS — G250 Essential tremor: Secondary | ICD-10-CM

## 2021-07-30 DIAGNOSIS — G4700 Insomnia, unspecified: Secondary | ICD-10-CM | POA: Diagnosis not present

## 2021-07-30 DIAGNOSIS — F801 Expressive language disorder: Secondary | ICD-10-CM | POA: Diagnosis not present

## 2021-07-30 DIAGNOSIS — G40309 Generalized idiopathic epilepsy and epileptic syndromes, not intractable, without status epilepticus: Secondary | ICD-10-CM

## 2021-07-30 DIAGNOSIS — F71 Moderate intellectual disabilities: Secondary | ICD-10-CM

## 2021-07-30 MED ORDER — TRAZODONE HCL 50 MG PO TABS: ORAL_TABLET | ORAL | 3 refills | Status: DC

## 2021-07-31 ENCOUNTER — Encounter (INDEPENDENT_AMBULATORY_CARE_PROVIDER_SITE_OTHER): Payer: Self-pay | Admitting: Family

## 2021-07-31 DIAGNOSIS — G4700 Insomnia, unspecified: Secondary | ICD-10-CM | POA: Insufficient documentation

## 2021-07-31 NOTE — Progress Notes (Signed)
? ?[name]   ?MRN:  [id number]  ?[birth year]  ? ?Provider: Elveria Rising NP-C ?Location of Care: [facility] ? ?Visit type: Return visit ? ?Last visit: 05/14/2021 ? ?Referral source: Gwinda Passe, NP ?History from: Epic chart and patient's mother ? 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 He was diagnosed with GERD and a Nissan fundoplication procedure stopped the vomiting. He also has organic gait disorder, essential tremor and stuttering. He is taking and tolerating Prednisone and brand Felbatol, Lamictal, and Keppra, and has remained largely seizure free for some time. He has been prescribed Propranolol for essential tremor. ? ?Today's concerns: ?Mom reports today that [name] has remained seizure free. She reports that the Propranolol prescribed for tremor has helped to reduce that but that he continues to have significant problems with insomnia. She believes that he is curious about what other family members are doing during the night, and maybe anxious as well. He was recently upset when he witnessed his mother's boyfriend receive CPR in their home, then be transported to the ED. Mom reports that [name] attends a day program and some days is too sleepy to do activities.  ? ?[name] has been otherwise generally healthy since he was last seen. Mom has no other health concerns for [name] today other than previously mentioned. ? ?Review of systems: ?Please see HPI for neurologic and other pertinent review of systems. Otherwise all other systems were reviewed and were negative. ? ?Problem List: ?Patient Active Problem List  ? Diagnosis Date Noted  ? Tremor, essential 05/16/2021  ? 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 Seizure (HCC) 05/31/2017  ? Seizures (HCC)  05/31/2017  ? Constipation 05/25/2016  ? Generalized abdominal pain 05/25/2016  ? Acne 08/02/2013  ? 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 Abnormality of gait 11/17/2012  ? Moderate intellectual disabilities 11/17/2012  ? Developmental delay disorder 11/17/2012  ?  ? ?Past Medical History:  ?Diagnosis Date  ? Asthma   ? Intellectual delay   ? Seizures (HCC)   ?  ?Past medical history comments: See HPI ?Copied from previous record: ?The patient has been treated with broad-spectrum antiepileptic drugs and prednisone since he came to this community in 2004. He has been admitted to [facility] with recurrent seizures. He was hospitalized November 08, 2004 with involuntary movements that were choreiform in nature. He has significant cognitive impairments, pervasive developmental delays in many domains, but no focal or lateralized neurologic findings.   ?MRI performed October 11, 2002 was normal. EEG Oct 05 2002 showed diffuse background slowing and multifocal sharply contoured slow-wave is principally in the central, parietal and temporal regions, right greater than left. ?  ?Surgical history: ?Past Surgical History:  ?Procedure Laterality Date  ? NISSEN FUNDOPLICATION  April 2009  ?  ? ?Family history: ?family history includes Asthma in his mother; Diabetes in his maternal grandmother; GER disease in his mother; Heart disease in his maternal grandmother; Leukemia in his maternal grandmother; Thyroid disease in his mother and sister.  ? ?Social history: ?Social History  ? ?Socioeconomic History  ? Marital status: Single  ?  Spouse name: Not on file  ? Number of children: Not on file  ? Years of education: Not on file  ? Highest education level: Not on file  ?Occupational History  ?  Not on file  ?Tobacco Use  ? Smoking status: Never  ?  Passive exposure: Never  ? Smokeless tobacco: Never  ?Substance and Sexual Activity  ? Alcohol use: No  ?  Alcohol/week:  0.0 standard drinks  ? Drug use: No  ? Sexual activity: Never  ?Other Topics Concern  ? Not on file  ?Social History Narrative  ? [name] attends [employer]. He lives with his mother and mother's boyfriend. He enjoys basketball, riding his bike, and watching wrestling.   ? ?Social Determinants of Health  ? ?Financial Resource Strain: Not on file  ?Food Insecurity: Not on file  ?Transportation Needs: Not on file  ?Physical Activity: Not on file  ?Stress: Not on file  ?Social Connections: Not on file  ?Intimate Partner Violence: Not on file  ?  ?Past/failed meds: ?Copied from previous record: ?Dilantin - hallucinations ? ?Allergies: ?Allergies  ?Allergen Reactions  ? Dilantin [Phenytoin Sodium Extended] Other (See Comments)  ?  hallucinations  ? Chloral Hydrate Hives and Rash  ?  ?Immunizations: ?Immunization History  ?Administered Date(s) Administered  ? Tdap 12/06/2017  ?  ?Diagnostics/Screenings: ?Copied from previous record: ?06/01/17 - rEEG - normal ? ?Physical Exam: ?Wt 163 lb (73.9 kg)   BMI 26.35 kg/m?   ?General: Well developed, well nourished young man, seated on exam table, in no evident distress ?Head: Head normocephalic and atraumatic.  Oropharynx benign. ?Neck: Supple ?Cardiovascular: Regular rate and rhythm, no murmurs ?Respiratory: Breath sounds clear to auscultation ?Musculoskeletal: No obvious deformities or scoliosis ?Skin: No rashes or neurocutaneous lesions ? ?Neurologic Exam ?Mental Status: Awake and fully alert.  Oriented to place and time. Fund of knowledge subnormal for age. Spoke very little.  Had some problems understanding and following instructions. Mood and affect appropriate. ?Cranial Nerves: Fundoscopic exam reveals sharp disc margins.  Pupils equal, briskly reactive to light.  Extraocular movements full without nystagmus. Hearing intact and symmetric to whisper.  Facial sensation intact.  Face tongue, palate move normally and symmetrically. Shoulder shrug  normal ?Motor: Normal functional bulk, tone and strength. Very mild essential tremor today. ?Sensory: Intact to touch and temperature in all extremities.  ?Coordination: Finger-to-nose performed accurately bilaterally. Balance is adequate. ?Gait and Station: Arises from chair without difficulty.  Stance is normal. Gait demonstrates normal stride length and balance.  ? ?Impression: ?Generalized convulsive epilepsy (HCC) ? ?Insomnia, unspecified type - Plan: traZODone (DESYREL) 50 MG tablet ? ?Tremor, essential ? ?Generalized nonconvulsive epilepsy (HCC) ? ?Expressive speech disorder ? ?Moderate intellectual disabilities  ? ? ?Recommendations for plan of care: ?[facility] [name] has neither had nor required imaging or lab studies since the last visit. He is a 31 year old man with history of epilepsy, tremor, intellectual disability and insomnia. Mom is particularly concerned today about his insomnia. I recommended a trial of Trazodone and explained to Mom how to give the medication. We will start at low dose and adjust as needed. I asked Mom to call me in a couple of weeks to let me know if it is helpful in getting [name] to sleep more at night. If is does not help, we may consider an SSRI for anxiety to see if that is what is keeping him awake at night. I will otherwise see him back in follow up in 4 months or sooner if needed.  ? ?The medication list was reviewed and reconciled. I reviewed the changes that were made in the prescribed medications today. A complete medication  list was provided to the patient. ? ?Return in about 4 months (around 11/29/2021). ? ? ?Allergies as of 07/30/2021   ? ?   Reactions  ? Dilantin [phenytoin Sodium Extended] Other (See Comments)  ? hallucinations  ? Chloral Hydrate Hives, Rash  ? ?  ? ?  ?Medication List  ?  ? ?  ? Accurate as of July 30, 2021 11:59 PM. If you have any questions, ask your nurse or doctor.  ?  ?  ? ?  ? ?STOP taking these  medications   ? ?albuterol (5 MG/ML) 0.5% nebulizer solution ?Commonly known as: PROVENTIL ?Stopped by: Elveria Rising, NP ?  ?clotrimazole-betamethasone cream ?Commonly known as: LOTRISONE ?Stopped by: Helane Gunther

## 2021-07-31 NOTE — Patient Instructions (Signed)
It was a pleasure to see you today! ? ?Instructions for you until your next appointment are as follows: ?I have prescribed Trazodone for Savant to see if it will help him to sleep more at night. ?Call me in a couple of weeks to let me know how he is doing.  ?Please sign up for MyChart if you have not done so. ?Please plan to return for follow up in 4 months or sooner if needed. ? ?  ?Feel free to contact our office during normal business hours at [phone] with questions or concerns. If there is no answer or the call is outside business hours, please leave a message and our clinic staff will call you back within the next business day.  If you have an urgent concern, please stay on the line for our after-hours answering service and ask for the on-call neurologist.   ?  ?I also encourage you to use MyChart to communicate with me more directly. If you have not yet signed up for MyChart within [facility], the front desk staff can help you. However, please note that this inbox is NOT monitored on nights or weekends, and response can take up to 2 business days.  Urgent matters should be discussed with the on-call pediatric neurologist.  ? ?At [facility], we are committed to providing exceptional care. You will receive a patient satisfaction survey through text or email regarding your visit today. Your opinion is important to me. Comments are appreciated.   ?

## 2021-08-08 ENCOUNTER — Other Ambulatory Visit (INDEPENDENT_AMBULATORY_CARE_PROVIDER_SITE_OTHER): Payer: Self-pay | Admitting: Family

## 2021-08-08 DIAGNOSIS — G40309 Generalized idiopathic epilepsy and epileptic syndromes, not intractable, without status epilepticus: Secondary | ICD-10-CM

## 2021-08-12 ENCOUNTER — Other Ambulatory Visit (INDEPENDENT_AMBULATORY_CARE_PROVIDER_SITE_OTHER): Payer: Self-pay | Admitting: Family

## 2021-08-12 DIAGNOSIS — G250 Essential tremor: Secondary | ICD-10-CM

## 2021-11-04 ENCOUNTER — Other Ambulatory Visit (INDEPENDENT_AMBULATORY_CARE_PROVIDER_SITE_OTHER): Payer: Self-pay | Admitting: Family

## 2021-11-04 DIAGNOSIS — G4700 Insomnia, unspecified: Secondary | ICD-10-CM

## 2021-11-04 DIAGNOSIS — G40309 Generalized idiopathic epilepsy and epileptic syndromes, not intractable, without status epilepticus: Secondary | ICD-10-CM

## 2021-11-13 ENCOUNTER — Ambulatory Visit (INDEPENDENT_AMBULATORY_CARE_PROVIDER_SITE_OTHER): Payer: Medicaid Other | Admitting: Family

## 2021-12-03 NOTE — Progress Notes (Signed)
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 He was diagnosed with GERD and a Nissan fundoplication procedure stopped the vomiting. He also has organic gait disorder, essential tremor and stuttering. He is taking and tolerating Prednisone and brand Felbatol, Lamictal, and Keppra, and has remained largely seizure free for some time. He has been prescribed Propranolol for essential tremor.  Today's concerns: Mom reports today that Javien has remained seizure free since his last visit. He is compliant with taking medication. He has some problems with sleep and tends to nap during the day.   Mom reports that there has been some family stress as their landlord has notified them of a rent increase and she is looking for a more affordable place for them.   Bharat has been otherwise generally healthy since he was last seen. His mother has no other health concerns for him today other than previously mentioned.  Review of systems: Please see HPI for neurologic and other pertinent review of systems. Otherwise all other systems were reviewed and were negative.  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 He has been admitted to [facility] with recurrent seizures. He was hospitalized November 08, 2004 with involuntary movements that were choreiform in nature. He has significant cognitive impairments, pervasive developmental delays in many domains, but no focal or lateralized neurologic findings.   MRI performed October 11, 2002 was normal. EEG Oct 05 2002 showed diffuse background slowing and multifocal sharply contoured slow-wave is principally in the central, parietal and temporal regions, right greater than left.   Surgical history: Past Surgical History:  Procedure Laterality Date   NISSEN FUNDOPLICATION  April 2009     Family history: family history includes Asthma in his mother; Diabetes in his maternal grandmother; GER disease in his mother; Heart disease in his maternal grandmother; Leukemia in his maternal grandmother; Thyroid disease in his mother and sister.   Social history: Social History   Socioeconomic History   Marital status: Single    Spouse name: Not on file   Number of children: Not on file   Years of education: Not on file   Highest education level: Not on file  Occupational History   Not on file  Tobacco Use   Smoking status: Never    Passive exposure: Never   Smokeless  tobacco: Never  Substance and Sexual Activity   Alcohol use: No    Alcohol/week: 0.0 standard drinks of alcohol   Drug use: No   Sexual activity: Never  Other Topics Concern   Not on file  Social History Narrative   Tahji attends [employer]  support adult program. He lives with his mother and mother's boyfriend. He enjoys basketball, riding his bike, and watching wrestling.    Social Determinants of Health   Financial Resource Strain: Not on file  Food Insecurity: Not on file  Transportation Needs: Not on file  Physical Activity: Not on file  Stress: Not on file  Social Connections: Not on file  Intimate Partner Violence: Not on file    Past/failed meds: Copied from previous record: Dilantin - hallucinations  Allergies: Allergies  Allergen Reactions   Dilantin [Phenytoin Sodium Extended] Other (See Comments)    hallucinations   Chloral Hydrate Hives and Rash    Immunizations: Immunization History  Administered Date(s) Administered   Tdap 12/06/2017    Diagnostics/Screenings: Copied from previous record: 06/01/17 - rEEG - normal  Physical Exam: BP 128/70   Pulse 62   Wt 162 lb 11.2 oz (73.8 kg)   BMI 26.30 kg/m   General: Well developed, well nourished young man, seated on exam table, in no evident distress Head: Head normocephalic and atraumatic.  Oropharynx benign. Neck: Supple Cardiovascular: Regular rate and rhythm, no murmurs Respiratory: Breath sounds clear to auscultation Musculoskeletal: No obvious deformities or scoliosis Skin: No rashes or neurocutaneous lesions  Neurologic Exam Mental Status: Awake and fully alert. Speech limited. Fund of knowledge subnormal for age.  Mood and affect appropriate. Cranial Nerves: Fundoscopic exam reveals sharp disc margins.  Pupils equal, briskly reactive to light.  Extraocular movements full without nystagmus. Hearing intact and symmetric to whisper.  Facial sensation intact.  Face tongue, palate move normally and symmetrically. Shoulder shrug normal Motor: Normal bulk and tone. Normal strength in all tested extremity muscles. He had no tremor today. Sensory: Intact to touch and temperature in all extremities.  Coordination: Finger-to-nose and heel-to shin  performed accurately bilaterally. Unable to follow instructions for Romberg Gait and Station: Arises from chair without difficulty.  Stance is normal. Gait demonstrates normal stride length and balance.   Able to heel, toe and tandem walk but had difficulty understanding instructions for doing so.  Reflexes: 1+ and symmetric. Toes downgoing.   Impression: Generalized convulsive epilepsy (HCC) - Plan: predniSONE (DELTASONE) 5 MG tablet  Insomnia, unspecified type  Tremor, essential  Generalized nonconvulsive epilepsy (HCC) - Plan: predniSONE (DELTASONE) 5 MG tablet  Expressive speech disorder  Moderate intellectual disabilities   Recommendations for plan of care: The patient's previous Epic records were reviewed. Ajahni has neither had nor required imaging or lab studies since the last visit. He has remained seizure free since his last visit and is compliant with medication. I asked Mom to let me know if he has any seizures. I will see him back in follow up in 6 months or sooner if needed.   The medication list was reviewed and reconciled. No changes were made in the prescribed medications today. A complete medication list was provided to the patient.  Return in about 6 months (around 06/06/2022).   Allergies as of 12/04/2021       Reactions   Dilantin [phenytoin Sodium Extended] Other (See Comments)   hallucinations   Chloral Hydrate Hives, Rash        Medication List  Accurate as of December 04, 2021 11:59 PM. If you have any questions, ask your nurse or doctor.          STOP taking these medications    polyethylene glycol powder 17 GM/SCOOP powder Commonly known as: GLYCOLAX/MIRALAX Stopped by: Elveria Rising, NP   triamcinolone cream 0.1 % Commonly known as: KENALOG Stopped by: Elveria Rising, NP       TAKE these medications    acetaminophen 500 MG tablet Commonly known as: TYLENOL Take 1 tablet (500 mg total) by mouth every 8 (eight) hours as  needed for mild pain or fever.   famotidine 20 MG tablet Commonly known as: PEPCID TAKE ONE TABLET BY MOUTH DAILY   felbamate 600 MG tablet Commonly known as: FELBATOL TAKE ONE TABLET BY MOUTH EVERY MORNING, ONE-HALF tab in afternoon, AND TAKE ONE TABLET BY MOUTH AT BEDTIME   lamoTRIgine 150 MG tablet Commonly known as: LAMICTAL Take 1 tablet (150 mg total) by mouth 2 (two) times daily.   lamoTRIgine 25 MG tablet Commonly known as: LAMICTAL Take 1 tablet (25 mg total) by mouth 2 (two) times daily.   levETIRAcetam 500 MG tablet Commonly known as: KEPPRA Take 1 tablet (500 mg total) by mouth 2 (two) times daily.   predniSONE 5 MG tablet Commonly known as: DELTASONE Take 1 tablet (5 mg total) by mouth daily.   propranolol 20 MG tablet Commonly known as: INDERAL give 1 tablet at bedtime   traZODone 50 MG tablet Commonly known as: DESYREL Give 1 tablet at bedtime      Total time spent with the patient was 20 minutes, of which 50% or more was spent in counseling and coordination of care.  Elveria Rising NP-C [facility] Ph. [phone] Fax [phone]

## 2021-12-04 ENCOUNTER — Encounter (INDEPENDENT_AMBULATORY_CARE_PROVIDER_SITE_OTHER): Payer: Self-pay | Admitting: Family

## 2021-12-04 ENCOUNTER — Ambulatory Visit (INDEPENDENT_AMBULATORY_CARE_PROVIDER_SITE_OTHER): Payer: Medicaid Other | Admitting: Family

## 2021-12-04 VITALS — BP 128/70 | HR 62 | Wt 162.7 lb

## 2021-12-04 DIAGNOSIS — G4700 Insomnia, unspecified: Secondary | ICD-10-CM | POA: Diagnosis not present

## 2021-12-04 DIAGNOSIS — G250 Essential tremor: Secondary | ICD-10-CM | POA: Diagnosis not present

## 2021-12-04 DIAGNOSIS — F801 Expressive language disorder: Secondary | ICD-10-CM

## 2021-12-04 DIAGNOSIS — F71 Moderate intellectual disabilities: Secondary | ICD-10-CM

## 2021-12-04 DIAGNOSIS — G40309 Generalized idiopathic epilepsy and epileptic syndromes, not intractable, without status epilepticus: Secondary | ICD-10-CM | POA: Diagnosis not present

## 2021-12-04 NOTE — Patient Instructions (Signed)
It was a pleasure to see you today!  Instructions for you until your next appointment are as follows: Continue giving the medications as prescribed.  Let me know if Marl has any seizures or if you have any other concerns. Please sign up for MyChart if you have not done so. Please plan to return for follow up in 6 months or sooner if needed.  Feel free to contact our office during normal business hours at [phone] with questions or concerns. If there is no answer or the call is outside business hours, please leave a message and our clinic staff will call you back within the next business day.  If you have an urgent concern, please stay on the line for our after-hours answering service and ask for the on-call neurologist.     I also encourage you to use MyChart to communicate with me more directly. If you have not yet signed up for MyChart within [facility], the front desk staff can help you. However, please note that this inbox is NOT monitored on nights or weekends, and response can take up to 2 business days.  Urgent matters should be discussed with the on-call pediatric neurologist.   At [facility], we are committed to providing exceptional care. You will receive a patient satisfaction survey through text or email regarding your visit today. Your opinion is important to me. Comments are appreciated.

## 2021-12-05 ENCOUNTER — Encounter (INDEPENDENT_AMBULATORY_CARE_PROVIDER_SITE_OTHER): Payer: Self-pay | Admitting: Family

## 2021-12-05 MED ORDER — PREDNISONE 5 MG PO TABS: 5.0000 mg | ORAL_TABLET | Freq: Every day | ORAL | 5 refills | Status: DC

## 2021-12-25 ENCOUNTER — Emergency Department (HOSPITAL_COMMUNITY)
Admission: EM | Admit: 2021-12-25 | Discharge: 2021-12-26 | Disposition: A | Discharge status: Home / Self Care | Payer: Medicaid Other | Attending: Emergency Medicine | Admitting: Emergency Medicine

## 2021-12-25 ENCOUNTER — Emergency Department (HOSPITAL_COMMUNITY): Payer: Medicaid Other

## 2021-12-25 ENCOUNTER — Telehealth (INDEPENDENT_AMBULATORY_CARE_PROVIDER_SITE_OTHER): Payer: Self-pay | Admitting: Family

## 2021-12-25 DIAGNOSIS — R824 Acetonuria: Secondary | ICD-10-CM | POA: Diagnosis not present

## 2021-12-25 DIAGNOSIS — R197 Diarrhea, unspecified: Secondary | ICD-10-CM | POA: Diagnosis present

## 2021-12-25 DIAGNOSIS — Z20822 Contact with and (suspected) exposure to covid-19: Secondary | ICD-10-CM | POA: Insufficient documentation

## 2021-12-25 DIAGNOSIS — R1013 Epigastric pain: Secondary | ICD-10-CM | POA: Diagnosis not present

## 2021-12-25 LAB — CBC WITH DIFFERENTIAL/PLATELET
Abs Immature Granulocytes: 0.05 10*3/uL (ref 0.00–0.07)
Basophils Absolute: 0 10*3/uL (ref 0.0–0.1)
Basophils Relative: 0 %
Eosinophils Absolute: 0.2 10*3/uL (ref 0.0–0.5)
Eosinophils Relative: 2 %
HCT: 49 % (ref 39.0–52.0)
Hemoglobin: 17.7 g/dL — ABNORMAL HIGH (ref 13.0–17.0)
Immature Granulocytes: 0 %
Lymphocytes Relative: 16 %
Lymphs Abs: 1.8 10*3/uL (ref 0.7–4.0)
MCH: 31.6 pg (ref 26.0–34.0)
MCHC: 36.1 g/dL — ABNORMAL HIGH (ref 30.0–36.0)
MCV: 87.5 fL (ref 80.0–100.0)
Monocytes Absolute: 0.7 10*3/uL (ref 0.1–1.0)
Monocytes Relative: 6 %
Neutro Abs: 8.5 10*3/uL — ABNORMAL HIGH (ref 1.7–7.7)
Neutrophils Relative %: 76 %
Platelets: 371 10*3/uL (ref 150–400)
RBC: 5.6 MIL/uL (ref 4.22–5.81)
RDW: 12 % (ref 11.5–15.5)
WBC: 11.2 10*3/uL — ABNORMAL HIGH (ref 4.0–10.5)
nRBC: 0 % (ref 0.0–0.2)

## 2021-12-25 LAB — COMPREHENSIVE METABOLIC PANEL
ALT: 33 U/L (ref 0–44)
AST: 24 U/L (ref 15–41)
Albumin: 4.7 g/dL (ref 3.5–5.0)
Alkaline Phosphatase: 83 U/L (ref 38–126)
Anion gap: 10 (ref 5–15)
BUN: 11 mg/dL (ref 6–20)
CO2: 23 mmol/L (ref 22–32)
Calcium: 9.5 mg/dL (ref 8.9–10.3)
Chloride: 103 mmol/L (ref 98–111)
Creatinine, Ser: 0.95 mg/dL (ref 0.61–1.24)
GFR, Estimated: >60 mL/min (ref >60)
Glucose, Bld: 104 mg/dL — ABNORMAL HIGH (ref 70–99)
Potassium: 3.7 mmol/L (ref 3.5–5.1)
Sodium: 136 mmol/L (ref 135–145)
Total Bilirubin: 1.2 mg/dL (ref 0.3–1.2)
Total Protein: 8 g/dL (ref 6.5–8.1)

## 2021-12-25 LAB — LIPASE, BLOOD: Lipase: 26 U/L (ref 11–51)

## 2021-12-25 LAB — RESP PANEL BY RT-PCR (FLU A&B, COVID) ARPGX2
Influenza A by PCR: NEGATIVE
Influenza B by PCR: NEGATIVE
SARS Coronavirus 2 by RT PCR: NEGATIVE

## 2021-12-25 LAB — LACTIC ACID, PLASMA: Lactic Acid, Venous: 0.8 mmol/L (ref 0.5–1.9)

## 2021-12-25 MED ORDER — IOHEXOL 300 MG/ML  SOLN
100.0000 mL | Freq: Once | INTRAMUSCULAR | Status: AC | PRN
  Administered 2021-12-25: 100 mL via INTRAVENOUS

## 2021-12-25 NOTE — ED Provider Triage Note (Signed)
Emergency Medicine Provider Triage Evaluation Note  [name] , a 31 y.o. male  was evaluated in triage.  Pt complains of diarrhea and abdominal pain starting today. Mom reports she thinks he is having som absence seizures as well. She reports he does have a seizure disorder, but doesn't have breakthrough seizures often. Denies any fever, nausea, or vomiting.  Review of Systems  Positive:  Negative:   Physical Exam  BP (!) 131/97 (BP Location: Left Arm)   Pulse (!) 112   Temp (!) 97.5 F (36.4 C)   Resp (!) 22   SpO2 98%  Gen:   Awake, no distress   Resp:  Normal effort  MSK:   Moves extremities without difficulty  Other:  Periumbilical and abdominal tenderness. Abdomen soft. Non distention. No guarding or rebound.   Medical Decision Making  Medically screening exam initiated at 8:52 PM.  Appropriate orders placed.  [name] was informed that the remainder of the evaluation will be completed by another provider, this initial triage assessment does not replace that evaluation, and the importance of remaining in the ED until their evaluation is complete.  Will order labs. Defer any imaging until patient is further evaluated int eh back. NAD.    Achille Rich, [place] 12/25/21 2055

## 2021-12-25 NOTE — ED Triage Notes (Signed)
Pt w mother, c/o "green diarrhea all day," associated generalized abd pain, mom states he had several "staring spells," hx of seizures presenting w same, but voiced compliance w medication.

## 2021-12-25 NOTE — Telephone Encounter (Signed)
I received a call from [facility] to speak with Mom. She said that Autry complained of abdominal pain soon after waking this morning, and has had repeated bouts of diarrhea on and off all day. She took him to ER because he had a couple of episodes today of being unusually quiet and staring, then he had an episode where he was pale, sweating and shaking, as well as staring. Mom said that she has checked in at the ER and is in the waiting area. I told Mom that he needs to be evaluated and that she was correct in taking him to be seen. I told her that I will call and check on him tomorrow. Mom agreed with this plan. TG

## 2021-12-26 LAB — URINALYSIS, ROUTINE W REFLEX MICROSCOPIC
Bacteria, UA: NONE SEEN
Bilirubin Urine: NEGATIVE
Glucose, UA: NEGATIVE mg/dL
Ketones, ur: 20 mg/dL — AB
Leukocytes,Ua: NEGATIVE
Nitrite: NEGATIVE
Protein, ur: NEGATIVE mg/dL
Specific Gravity, Urine: >1.046 — ABNORMAL HIGH (ref 1.005–1.030)
pH: 5 (ref 5.0–8.0)

## 2021-12-26 MED ORDER — ONDANSETRON HCL 4 MG PO TABS: 4.0000 mg | ORAL_TABLET | Freq: Four times a day (QID) | ORAL | 0 refills | Status: DC

## 2021-12-26 MED ORDER — LACTATED RINGERS IV BOLUS
1000.0000 mL | Freq: Once | INTRAVENOUS | Status: AC
  Administered 2021-12-26: 1000 mL via INTRAVENOUS

## 2021-12-26 MED ORDER — ONDANSETRON HCL 4 MG/2ML IJ SOLN
4.0000 mg | Freq: Once | INTRAMUSCULAR | Status: AC
  Administered 2021-12-26: 4 mg via INTRAVENOUS
  Filled 2021-12-26: qty 2

## 2021-12-26 NOTE — Telephone Encounter (Signed)
I called Mom to check on [name]. He is still in ED but may be discharged today after receiving IV fluids. Mom said that he has not had more seizure activity. TG

## 2021-12-26 NOTE — ED Provider Notes (Signed)
[facility] EMERGENCY DEPARTMENT Provider Note   CSN: [id number] Arrival date & time: 12/25/21  2004     History No chief complaint on file.   HPI [name] is a 31 y.o. male presenting for diarrhea.  Patient has an extensive history of intellectual disability, seizure disorder following with neurology.  Patient goes to an adult daycare.  No known sick contacts though patient has had similar illnesses in the past.  Patient has had copious amount of diarrhea over the past 24 hours.  He requires assistance with ADLs and his mother states that he is going to the bathroom every 1 hour with blood counts of diarrhea.  They deny bilious or bloody diarrhea.  He is otherwise tolerating p.o. intake.  They deny vomiting.  They deny fevers at home. Denies syncope, shortness of breath, lightheadedness.  Patient has mild epigastric abdominal pain. Notably, the patient did have a staring episode on the toilet earlier today.  Patient's mother states it lasted approximately 30 seconds and patient immediately returned to baseline. He has a seizure history of grand mal seizures and has had no breakthrough seizures at this time. Patient's recorded medical, surgical, social, medication list and allergies were reviewed in the Snapshot window as part of the initial history.   Review of Systems   Review of Systems  Constitutional:  Negative for chills and fever.  HENT:  Negative for ear pain and sore throat.   Eyes:  Negative for pain and visual disturbance.  Respiratory:  Negative for cough and shortness of breath.   Cardiovascular:  Negative for chest pain and palpitations.  Gastrointestinal:  Positive for diarrhea. Negative for abdominal pain and vomiting.  Genitourinary:  Negative for dysuria and hematuria.  Musculoskeletal:  Negative for arthralgias and back pain.  Skin:  Negative for color change and rash.  Neurological:  Negative for seizures and syncope.  All other systems  reviewed and are negative.   Physical Exam Updated Vital Signs BP (!) 141/87 (BP Location: Right Arm)   Pulse (!) 111   Temp (!) 97.4 F (36.3 C) (Oral)   Resp (!) 24   Ht 5\' 6"  (1.676 m)   Wt 72.6 kg   SpO2 98%   BMI 25.82 kg/m  Physical Exam Vitals and nursing note reviewed.  Constitutional:      General: He is not in acute distress.    Appearance: He is well-developed.  HENT:     Head: Normocephalic and atraumatic.  Eyes:     Conjunctiva/sclera: Conjunctivae normal.  Cardiovascular:     Rate and Rhythm: Normal rate and regular rhythm.     Heart sounds: No murmur heard. Pulmonary:     Effort: Pulmonary effort is normal. No respiratory distress.     Breath sounds: Normal breath sounds.  Abdominal:     Palpations: Abdomen is soft.     Tenderness: There is no abdominal tenderness.  Musculoskeletal:        General: No swelling.     Cervical back: Neck supple.  Skin:    General: Skin is warm and dry.     Capillary Refill: Capillary refill takes less than 2 seconds.  Neurological:     General: No focal deficit present.     Mental Status: He is alert and oriented to person, place, and time. Mental status is at baseline.  Psychiatric:        Mood and Affect: Mood normal.     ED Course/ Medical Decision Making/ A&P  Procedures Procedures   Medications Ordered in ED Medications  lactated ringers bolus 1,000 mL (has no administration in time range)  ondansetron (ZOFRAN) injection 4 mg (has no administration in time range)  iohexol (OMNIPAQUE) 300 MG/ML solution 100 mL (100 mLs Intravenous Contrast Given 12/25/21 2251)    Medical Decision Making:    [name] is a 31 y.o. male who presented to the ED today with diarrhea detailed above.     Additional history discussed with patient's family/caregivers.  Patient's presentation is complicated by their history of multiple comorbid medical problems including intellectual disability and chronic seizure disorder  requiring outpatient prescription management.  Patient placed on continuous vitals and telemetry monitoring while in ED which was reviewed periodically.   Complete initial physical exam performed, notably the patient  was hemodynamically stable in no acute distress.  Initially tachycardic in triage though resolved when patient brought back to emergency room room bed after 13 hours in the emergency department.      Reviewed and confirmed nursing documentation for past medical history, family history, social history.    Initial Assessment:   With the patient's presentation of diarrhea with episodic abdominal pain, most likely diagnosis is developing colitis. Other diagnoses were considered including (but not limited to) diverticulitis, appendicitis, cholecystitis, small bowel obstruction. These are considered less likely due to history of present illness and physical exam findings.  Patient's initial disability limits abdominal exam and peritonitis remains on the differential though less likely based on reassuring exam at this time. This is most consistent with an acute life/limb threatening illness complicated by underlying chronic conditions.  Initial Plan:  CT abdomen pelvis with contrast to evaluate for structural underlying intra abdominal pathology including appendicitis or cholecystitis as well as evidence of any infectious diverticulitis Screening labs including CBC and Metabolic panel to evaluate for infectious or metabolic etiology of disease.  Urinalysis with reflex culture ordered to evaluate for UTI or relevant urologic/nephrologic pathology.  IV fluid infusion given initial tachycardia and evidence of ketones on urine concerning for possible dehydration Objective evaluation as below reviewed with plan for close reassessment  Initial Study Results:   Laboratory  All laboratory results reviewed without evidence of clinically relevant pathology.   Exceptions include: Ketonuria  concerning for possible dehydration, leukocytosis   Radiology  All images reviewed independently. Agree with radiology report at this time.   CT ABDOMEN PELVIS W CONTRAST  Result Date: 12/25/2021 CLINICAL DATA:  Acute abdominal pain EXAM: CT ABDOMEN AND PELVIS WITH CONTRAST TECHNIQUE: Multidetector CT imaging of the abdomen and pelvis was performed using the standard protocol following bolus administration of intravenous contrast. RADIATION DOSE REDUCTION: This exam was performed according to the departmental dose-optimization program which includes automated exposure control, adjustment of the mA and/or kV according to patient size and/or use of iterative reconstruction technique. CONTRAST:  OMNIPAQUE IOHEXOL 300 MG/ML  SOLN COMPARISON:  09/12/2018 FINDINGS: Lower chest: No acute abnormality. Hepatobiliary: No focal liver abnormality is seen. No gallstones, gallbladder wall thickening, or biliary dilatation. Pancreas: Unremarkable. No pancreatic ductal dilatation or surrounding inflammatory changes. Spleen: Normal in size without focal abnormality. Adrenals/Urinary Tract: Adrenal glands are within normal limits. Kidneys demonstrate a normal enhancement pattern bilaterally. No renal calculi or obstructive changes are seen. The bladder is partially distended. Stomach/Bowel: No obstructive or inflammatory changes of the colon are seen. The appendix is within normal limits. Small bowel and stomach are unremarkable. Postsurgical changes are noted near the GE junction stable from the prior exam. Vascular/Lymphatic: No  significant vascular findings are present. No enlarged abdominal or pelvic lymph nodes. Reproductive: Prostate is unremarkable. Other: No abdominal wall hernia or abnormality. No abdominopelvic ascites. Musculoskeletal: No acute or significant osseous findings. IMPRESSION: No acute abnormality correspond with the given clinical history. Electronically Signed   By: Alcide Clever M.D.   On:  12/25/2021 23:02     Final Assessment and Plan:   Patient's history of present illness and physical exam findings are most consistent with nonspecific infectious etiology.  Labs with slight leukocytosis but no bloody etiology consistent with emergent diarrhea especially given relatively short timeframe of only 1 day, this likely remains viral in origin.  No CT findings of infectious colitis or diverticulitis at this time and patient's repeat abdominal exam is grossly improved.  IV fluids administered and patient symptomatically improved stable for continued outpatient care and management of likely developing colitis.  Recommended close observation with patient's mother who felt comfortable with outpatient care and management at this time given prolonged ED course.  Planning to follow-up with PCP and patient's primary neurologist for close outpatient follow-up.  Patient discharged with no further acute events.  Strict return precautions regarding worsening of symptoms, recurrence of seizures, intolerance of p.o., worsening pain reinforced and patient expressed understanding.   Clinical Impression:  1. Diarrhea of presumed infectious origin      Data Unavailable   Final Clinical Impression(s) / ED Diagnoses Final diagnoses:  Diarrhea of presumed infectious origin    Rx / DC Orders ED Discharge Orders          Ordered    ondansetron (ZOFRAN) 4 MG tablet  Every 6 hours        12/26/21 0901              Glyn Ade, MD 12/26/21 1635

## 2021-12-26 NOTE — ED Notes (Signed)
Got patient into a gown on the monitor patient is resting with call bell in reach and family at bedside ?

## 2022-01-29 ENCOUNTER — Other Ambulatory Visit (INDEPENDENT_AMBULATORY_CARE_PROVIDER_SITE_OTHER): Payer: Self-pay | Admitting: Family

## 2022-01-29 DIAGNOSIS — G250 Essential tremor: Secondary | ICD-10-CM

## 2022-02-25 ENCOUNTER — Other Ambulatory Visit (INDEPENDENT_AMBULATORY_CARE_PROVIDER_SITE_OTHER): Payer: Self-pay | Admitting: Family

## 2022-02-25 DIAGNOSIS — G4700 Insomnia, unspecified: Secondary | ICD-10-CM

## 2022-02-25 DIAGNOSIS — G40309 Generalized idiopathic epilepsy and epileptic syndromes, not intractable, without status epilepticus: Secondary | ICD-10-CM

## 2022-04-21 ENCOUNTER — Other Ambulatory Visit (INDEPENDENT_AMBULATORY_CARE_PROVIDER_SITE_OTHER): Payer: Self-pay | Admitting: Family

## 2022-04-21 DIAGNOSIS — G40309 Generalized idiopathic epilepsy and epileptic syndromes, not intractable, without status epilepticus: Secondary | ICD-10-CM

## 2022-04-21 NOTE — Telephone Encounter (Signed)
Seen 12/04/21 follow up 05/25/22 meds refilled x 1 month to last until OV

## 2022-05-19 ENCOUNTER — Other Ambulatory Visit (INDEPENDENT_AMBULATORY_CARE_PROVIDER_SITE_OTHER): Payer: Self-pay | Admitting: Family

## 2022-05-19 DIAGNOSIS — G4700 Insomnia, unspecified: Secondary | ICD-10-CM

## 2022-05-19 DIAGNOSIS — G40309 Generalized idiopathic epilepsy and epileptic syndromes, not intractable, without status epilepticus: Secondary | ICD-10-CM

## 2022-05-19 NOTE — Telephone Encounter (Signed)
Seen 11/14/21 follow up sched 05/25/22

## 2022-05-22 ENCOUNTER — Other Ambulatory Visit (INDEPENDENT_AMBULATORY_CARE_PROVIDER_SITE_OTHER): Payer: Self-pay | Admitting: Family

## 2022-05-22 DIAGNOSIS — G40309 Generalized idiopathic epilepsy and epileptic syndromes, not intractable, without status epilepticus: Secondary | ICD-10-CM

## 2022-05-22 NOTE — Telephone Encounter (Signed)
Next OV: 05-25-2022  Last OV: 12-04-2021, Plan: To return in 76mos. No change to medications.  Note: Keppra not needed, See below:  Medication Dispense Information  levetiracetam 500 mg tablet  Sig: SMARTSIG:1 Tablet(s) By Mouth Twice Daily  Dispensed: 05/19/2022 12:00 AM  Unit strength: 500 mg  Days supply: 30  Dispense Note: ORIGINAL KDX:IPJA ONE TABLET BY MOUTH TWICE DAILY[SOLD:01/10/2023]  Quantity: 60 each  Refills remaining: 0  Pharmacy: [facility], [place] [address] Phone: [phone] Fax: [phone]  Authorizing provider: Rockwell Germany, NP [address] [place] [address] Phone: [phone] Fax: [phone]    For the other 2 medications (same med/different doses):  Medication Dispense Information  lamotrigine 150 mg tablet  Sig: SMARTSIG:1 Tablet(s) By Mouth Twice Daily  Dispensed: 05/19/2022 12:00 AM  Unit strength: 150 mg  Days supply: 30  Dispense Note: ORIGINAL ERD:EYCX ONE TABLET BY MOUTH TWICE DAILY[SOLD:01/10/2023]  Quantity: 60 each  Refills remaining: 0  Pharmacy: [facility], [place] [address] [place] [address] Phone: [phone] Fax: [phone]  Authorizing provider: Rockwell Germany, NP [address] [place] [address] Phone: [phone] Fax: [phone]    Medication Dispense Information  lamotrigine 25 mg tablet  Sig: SMARTSIG:1 Tablet(s) By Mouth Twice Daily  Dispensed: 05/19/2022 12:00 AM  Unit strength: 25 mg  Days supply: 30  Dispense Note: ORIGINAL HUT:MLYY ONE TABLET BY MOUTH TWICE DAILY[SOLD:01/10/2023]  Quantity: 60 each  Refills remaining: 0  Pharmacy: [facility], [place] [address] [place] [address] Phone: [phone] Fax: [phone]  Authorizing provider: Rockwell Germany,  NP [address] [place] [address] Phone: [phone] Fax: [phone]

## 2022-05-22 NOTE — Telephone Encounter (Signed)
Pharmacist says they are having trouble with there system. They did not receive them. Can you resent them please.  Marrianne Mood CMA

## 2022-05-25 ENCOUNTER — Ambulatory Visit (INDEPENDENT_AMBULATORY_CARE_PROVIDER_SITE_OTHER): Payer: Medicaid Other | Admitting: Family

## 2022-05-27 ENCOUNTER — Ambulatory Visit (INDEPENDENT_AMBULATORY_CARE_PROVIDER_SITE_OTHER): Payer: Self-pay | Admitting: Family

## 2022-05-27 ENCOUNTER — Ambulatory Visit (HOSPITAL_COMMUNITY)
Admission: RE | Admit: 2022-05-27 | Discharge: 2022-05-27 | Disposition: A | Discharge status: Home / Self Care | Payer: Medicaid Other | Source: Ambulatory Visit | Attending: Family Medicine | Admitting: Family Medicine

## 2022-05-27 ENCOUNTER — Encounter (HOSPITAL_COMMUNITY): Payer: Self-pay

## 2022-05-27 ENCOUNTER — Ambulatory Visit (INDEPENDENT_AMBULATORY_CARE_PROVIDER_SITE_OTHER): Payer: Medicaid Other | Admitting: Family

## 2022-05-27 VITALS — BP 113/77 | HR 80 | Temp 98.0°F | Resp 16 | Ht 66.0 in | Wt 160.1 lb

## 2022-05-27 DIAGNOSIS — J069 Acute upper respiratory infection, unspecified: Secondary | ICD-10-CM | POA: Diagnosis not present

## 2022-05-27 LAB — POC INFLUENZA A AND B ANTIGEN (URGENT CARE ONLY)
INFLUENZA A ANTIGEN, POC: NEGATIVE
INFLUENZA B ANTIGEN, POC: NEGATIVE

## 2022-05-27 MED ORDER — BENZONATATE 100 MG PO CAPS: ORAL_CAPSULE | ORAL | 0 refills | Status: DC

## 2022-05-27 MED ORDER — IBUPROFEN 800 MG PO TABS
800.0000 mg | ORAL_TABLET | Freq: Three times a day (TID) | ORAL | 0 refills | Status: DC
Start: 2022-05-27 — End: 2022-07-09

## 2022-05-27 NOTE — ED Triage Notes (Signed)
Chief Complaint: body aches, fever, runny nose, slight cough that is dry. Patient has history of seizures. Has been fatigued and slightly dazed. Emesis yesterday. Highest fever at home was 100.2.   Onset: 3 days   Prescriptions or OTC medications tried: Yes- tylenol    with mild relief  Sick exposure: No  New foods, medications, or products: No  Recent Travel: No

## 2022-05-28 NOTE — ED Provider Notes (Signed)
[facility]   [id number] 05/27/22 Arrival Time: [place] PLAN:  1. Viral URI with cough    Discussed typical duration of likely viral illness. OTC symptom care as needed. Results for orders placed or performed during the hospital encounter of 05/27/22  POC Influenza A & B Ag (Urgent Care)  Result Value Ref Range   INFLUENZA A ANTIGEN, POC NEGATIVE NEGATIVE   INFLUENZA B ANTIGEN, POC NEGATIVE NEGATIVE     Discharge Medication List as of 05/27/2022  6:30 PM     START taking these medications   Details  benzonatate (TESSALON) 100 MG capsule Take 1 capsule by mouth every 8 (eight) hours for cough., Normal    ibuprofen (ADVIL) 800 MG tablet Take 1 tablet (800 mg total) by mouth 3 (three) times daily with meals., Starting Wed 05/27/2022, Normal         Follow-up Information     Kerin Perna, NP.   Specialty: Internal Medicine Why: As needed. Contact information: [address] [address] [facility].   Specialty: Urgent Care Why: If worsening or failing to improve as anticipated. Contact information: [place] [address] [phone]                Reviewed expectations re: course of current medical issues. Questions answered. Outlined signs and symptoms indicating need for more acute intervention. Understanding verbalized. After Visit Summary given.   SUBJECTIVE: History from: Caregiver. Pascual Mantel is a 32 y.o. male. Reports: body aches, fever, runny nose, slight cough that is dry. Patient has history of seizures. Has been fatigued and slightly dazed. Emesis yesterday. Highest fever at home was 100.2. Onset 2-3 d ago. Denies: difficulty breathing. Normal PO intake without n/v/d.  OBJECTIVE:  Vitals:   05/27/22 1718  BP: 113/77  Pulse: 80  Resp: 16  Temp: 98 F (36.7 C)  TempSrc: Oral  SpO2: 97%  Weight: 72.6 kg   Height: 5\' 6"  (1.676 m)    General appearance: alert; no distress Eyes: PERRLA; EOMI; conjunctiva normal HENT: [place]; AT; with nasal congestion Neck: supple  Lungs: speaks full sentences without difficulty; unlabored; CTAB Extremities: no edema Skin: warm and dry Neurologic: normal gait Psychological: alert and cooperative; normal mood and affect  Labs: Results for orders placed or performed during the hospital encounter of 05/27/22  POC Influenza A & B Ag (Urgent Care)  Result Value Ref Range   INFLUENZA A ANTIGEN, POC NEGATIVE NEGATIVE   INFLUENZA B ANTIGEN, POC NEGATIVE NEGATIVE     Imaging: No results found.  Allergies  Allergen Reactions   Dilantin [Phenytoin Sodium Extended] Other (See Comments)    hallucinations   Chloral Hydrate Hives and Rash    Past Medical History:  Diagnosis Date   Asthma    Intellectual delay    Seizures (HCC)    Social History   Socioeconomic History   Marital status: Single    Spouse name: Not on file   Number of children: Not on file   Years of education: Not on file   Highest education level: Not on file  Occupational History   Not on file  Tobacco Use   Smoking status: Never    Passive exposure: Never   Smokeless tobacco: Never  Substance and Sexual Activity   Alcohol use: No    Alcohol/week: 0.0 standard drinks of alcohol   Drug use: No  Sexual activity: Never  Other Topics Concern   Not on file  Social History Narrative   Joseh refuses to attend the [employer]. He lives with his mother and mother's boyfriend. He enjoys basketball, riding his bike, and watching wrestling.    Social Determinants of Health   Financial Resource Strain: Not on file  Food Insecurity: Not on file  Transportation Needs: Not on file  Physical Activity: Not on file  Stress: Not on file  Social Connections: Not on file  Intimate Partner Violence: Not on file   Family History  Problem Relation Age of Onset   Asthma Mother    GER  disease Mother    Thyroid disease Mother    Thyroid disease Sister    Leukemia Maternal Grandmother    Diabetes Maternal Grandmother    Heart disease Maternal Grandmother    Seizures Neg Hx    Parkinsonism Neg Hx    Past Surgical History:  Procedure Laterality Date   NISSEN FUNDOPLICATION  April 2595     Vanessa Kick, MD 05/28/22 1003

## 2022-06-15 ENCOUNTER — Other Ambulatory Visit (INDEPENDENT_AMBULATORY_CARE_PROVIDER_SITE_OTHER): Payer: Self-pay | Admitting: Family

## 2022-06-15 DIAGNOSIS — G40309 Generalized idiopathic epilepsy and epileptic syndromes, not intractable, without status epilepticus: Secondary | ICD-10-CM

## 2022-06-15 NOTE — Telephone Encounter (Signed)
Seen 12/04/21 Next appt 07/09/22 Rx written 02/25/22 with 3 refills

## 2022-07-04 ENCOUNTER — Other Ambulatory Visit (INDEPENDENT_AMBULATORY_CARE_PROVIDER_SITE_OTHER): Payer: Self-pay | Admitting: Family

## 2022-07-04 DIAGNOSIS — G40309 Generalized idiopathic epilepsy and epileptic syndromes, not intractable, without status epilepticus: Secondary | ICD-10-CM

## 2022-07-09 ENCOUNTER — Encounter (INDEPENDENT_AMBULATORY_CARE_PROVIDER_SITE_OTHER): Payer: Self-pay | Admitting: Family

## 2022-07-09 ENCOUNTER — Ambulatory Visit (INDEPENDENT_AMBULATORY_CARE_PROVIDER_SITE_OTHER): Payer: Medicaid Other | Admitting: Family

## 2022-07-09 VITALS — BP 114/80 | HR 72 | Ht 66.14 in | Wt 176.5 lb

## 2022-07-09 DIAGNOSIS — G250 Essential tremor: Secondary | ICD-10-CM

## 2022-07-09 DIAGNOSIS — G40309 Generalized idiopathic epilepsy and epileptic syndromes, not intractable, without status epilepticus: Secondary | ICD-10-CM | POA: Diagnosis not present

## 2022-07-09 DIAGNOSIS — G4700 Insomnia, unspecified: Secondary | ICD-10-CM

## 2022-07-09 DIAGNOSIS — F801 Expressive language disorder: Secondary | ICD-10-CM | POA: Diagnosis not present

## 2022-07-09 DIAGNOSIS — F71 Moderate intellectual disabilities: Secondary | ICD-10-CM

## 2022-07-09 MED ORDER — PROPRANOLOL HCL 20 MG PO TABS: ORAL_TABLET | ORAL | 5 refills | Status: DC

## 2022-07-09 NOTE — Progress Notes (Signed)
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 He was diagnosed with GERD and a Nissan fundoplication procedure stopped the vomiting. He also has organic gait disorder, essential tremor and stuttering. He is taking and tolerating Prednisone, Felbatol, Lamictal, and Keppra, and has remained largely seizure free for some time. He has been prescribed Propranolol for essential tremor and Trazodone for sleep.  Today's concerns: Has remained seizure free Doesn't want to go to day program. Mom is looking for other options for him. Stays at home with his mother and goes out into the community with his sister.  Is anxious in [place] has been otherwise generally healthy since he was last seen. No health concerns today other than previously mentioned.  Review of systems: Please see HPI for neurologic and other pertinent review of systems. Otherwise all other systems were reviewed and were negative.  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 He has been admitted to [facility] with recurrent seizures. He was hospitalized November 08, 2004 with involuntary movements that were choreiform in nature. He has significant cognitive impairments, pervasive developmental delays in many domains, but no focal or lateralized neurologic findings.   MRI performed October 11, 2002 was normal. EEG Oct 05 2002 showed diffuse background slowing and multifocal sharply contoured slow-wave is principally in the central, parietal and temporal regions, right greater than left  Surgical history: Past Surgical History:  Procedure Laterality Date   NISSEN FUNDOPLICATION  April [id number]     Family history: family history includes Asthma in his mother; Diabetes in his maternal grandmother; GER disease in his mother; Heart disease in his maternal grandmother; Leukemia in his maternal grandmother; Thyroid disease in his mother and sister.   Social history: Social History   Socioeconomic History   Marital status: Single    Spouse name: Not on file   Number of children: Not on file   Years of education: Not on file   Highest education level: Not on file  Occupational History   Not on file  Tobacco Use   Smoking status: Never    Passive exposure: Never   Smokeless tobacco: Never  Substance and Sexual Activity   Alcohol use: No    Alcohol/week: 0.0 standard drinks of alcohol   Drug use: No  Sexual activity: Never  Other Topics Concern   Not on file  Social History Narrative   Liron refuses to attend the [employer]. He lives with his mother and mother's boyfriend. He enjoys basketball, riding his bike, and watching  wrestling.    Social Determinants of Health   Financial Resource Strain: Not on file  Food Insecurity: Not on file  Transportation Needs: Not on file  Physical Activity: Not on file  Stress: Not on file  Social Connections: Not on file  Intimate Partner Violence: Not on file    Past/failed meds: Copied from previous record: Dilantin - hallucinations   Allergies: Allergies  Allergen Reactions   Dilantin [Phenytoin Sodium Extended] Other (See Comments)    hallucinations   Chloral Hydrate Hives and Rash    Immunizations: Immunization History  Administered Date(s) Administered   Tdap 12/06/2017    Diagnostics/Screenings: Copied from previous record: 06/01/17 - rEEG - normal   Physical Exam: BP 114/80 (BP Location: Left Arm, Patient Position: Sitting, Cuff Size: Normal)   Pulse 72   Ht 5' 6.14" (1.68 m)   Wt 176 lb 8 oz (80.1 kg)   BMI 28.37 kg/m   General: Well developed, well nourished young man, seated on exam table, in no evident distress Head: Head normocephalic and atraumatic.  Oropharynx benign. Neck: Supple Cardiovascular: Regular rate and rhythm, no murmurs Respiratory: Breath sounds clear to auscultation Musculoskeletal: No obvious deformities or scoliosis Skin: No rashes or neurocutaneous lesions  Neurologic Exam Mental Status: Awake and fully alert. Limited speech. Variable eye contact. Trembling at times during examination. Requires frequent redirection. Cranial Nerves: Fundoscopic exam reveals sharp disc margins.  Turns to localize faces, objects and sounds in the periphery. Motor: Normal bulk and tone. Normal strength in all tested extremity muscles. Has outstretched hand tremor. Sensory: Withdrawal x 4 Coordination: No dysmetria when reaching for objects. Gait and Station: Arises from chair without difficulty.  Stance is normal. Gait demonstrates normal stride length and balance.   Able to heel, toe and tandem walk without difficulty. Reflexes: 1+ and  symmetric. Toes downgoing.   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 No recent diagnostic studies to be reviewed with the patient.  Plan until next visit: Continue medications as prescribed  Call if seizures occur Return in about 6 months (around 01/07/2023).  The medication list was reviewed and reconciled. No changes were made in the prescribed medications today. A complete medication list was provided to the patient.  Allergies as of 07/09/2022       Reactions   Dilantin [phenytoin Sodium Extended] Other (See Comments)   hallucinations   Chloral Hydrate Hives, Rash        Medication List        Accurate as of July 09, 2022  3:55 PM. If you have any questions, ask your nurse or doctor.          STOP taking these medications    benzonatate 100 MG capsule Commonly known as: TESSALON Stopped by: Rockwell Germany, NP   famotidine 20 MG tablet Commonly known as: PEPCID Stopped by: Rockwell Germany, NP   ibuprofen 800 MG tablet Commonly known as: ADVIL Stopped by: Rockwell Germany, NP       TAKE these medications    acetaminophen  500 MG tablet Commonly known as: TYLENOL Take 1 tablet (500 mg total) by mouth every 8 (eight) hours as needed for mild pain or fever.   felbamate 600 MG tablet Commonly known as: FELBATOL TAKE ONE TABLET BY MOUTH EVERY MORNING ONE-HALF tablet in afternoon, AND TAKE ONE TABLET AT BEDTIME   lamoTRIgine 150 MG tablet Commonly known as: LAMICTAL TAKE ONE TABLET BY MOUTH TWICE DAILY   lamoTRIgine 25 MG tablet Commonly known as: LAMICTAL TAKE ONE TABLET BY MOUTH TWICE DAILY   levETIRAcetam 500 MG tablet Commonly known as:  KEPPRA TAKE ONE TABLET BY MOUTH TWICE DAILY   ondansetron 4 MG tablet Commonly known as: ZOFRAN Take 1 tablet (4 mg total) by mouth every 6 (six) hours.   predniSONE 5 MG tablet Commonly known as: DELTASONE Take 1 tablet (5 mg total) by mouth daily.   propranolol 20 MG tablet Commonly known as: INDERAL give 1 tablet at bedtime   traZODone 50 MG tablet Commonly known as: DESYREL TAKE ONE TABLET BY MOUTH AT BEDTIME      Total time spent with the patient was 25 minutes, of which 50% or more was spent in counseling and coordination of care.  Rockwell Germany NP-C [facility] [id number] [place], [place] [address] Ph. [phone] Fax [phone]

## 2022-07-09 NOTE — Patient Instructions (Signed)
It was a pleasure to see you today!  Instructions for you until your next appointment are as follows: Continue taking the medications as prescribed Let me know if seizures occur Please sign up for MyChart if you have not done so. Please plan to return for follow up in 6 months or sooner if needed.  Feel free to contact our office during normal business hours at [phone] with questions or concerns. If there is no answer or the call is outside business hours, please leave a message and our clinic staff will call you back within the next business day.  If you have an urgent concern, please stay on the line for our after-hours answering service and ask for the on-call neurologist.     I also encourage you to use MyChart to communicate with me more directly. If you have not yet signed up for MyChart within [facility], the front desk staff can help you. However, please note that this inbox is NOT monitored on nights or weekends, and response can take up to 2 business days.  Urgent matters should be discussed with the on-call pediatric neurologist.   At [facility], we are committed to providing exceptional care. You will receive a patient satisfaction survey through text or email regarding your visit today. Your opinion is important to me. Comments are appreciated.

## 2022-08-10 ENCOUNTER — Other Ambulatory Visit (INDEPENDENT_AMBULATORY_CARE_PROVIDER_SITE_OTHER): Payer: Self-pay | Admitting: Family

## 2022-08-10 DIAGNOSIS — G4700 Insomnia, unspecified: Secondary | ICD-10-CM

## 2022-10-06 ENCOUNTER — Other Ambulatory Visit (INDEPENDENT_AMBULATORY_CARE_PROVIDER_SITE_OTHER): Payer: Self-pay | Admitting: Family

## 2022-10-06 DIAGNOSIS — G40309 Generalized idiopathic epilepsy and epileptic syndromes, not intractable, without status epilepticus: Secondary | ICD-10-CM

## 2022-11-02 ENCOUNTER — Other Ambulatory Visit (INDEPENDENT_AMBULATORY_CARE_PROVIDER_SITE_OTHER): Payer: Self-pay | Admitting: Family

## 2022-11-02 DIAGNOSIS — G40309 Generalized idiopathic epilepsy and epileptic syndromes, not intractable, without status epilepticus: Secondary | ICD-10-CM

## 2022-11-30 ENCOUNTER — Other Ambulatory Visit (INDEPENDENT_AMBULATORY_CARE_PROVIDER_SITE_OTHER): Payer: Self-pay | Admitting: Family

## 2022-11-30 DIAGNOSIS — G40309 Generalized idiopathic epilepsy and epileptic syndromes, not intractable, without status epilepticus: Secondary | ICD-10-CM

## 2022-12-07 ENCOUNTER — Telehealth (INDEPENDENT_AMBULATORY_CARE_PROVIDER_SITE_OTHER): Payer: Self-pay | Admitting: Family

## 2022-12-07 DIAGNOSIS — F40243 Fear of flying: Secondary | ICD-10-CM

## 2022-12-07 NOTE — Telephone Encounter (Signed)
Who's calling (name and relationship to patient) : [name]; mom   Best contact number: [phone]  Provider they see: Seward Carol  Reason for call: Mom is calling in wanting to know which mediation she can give Juan to be able to calm him down while on the air plane. She stated he has never been on the plane. She is requesting a call back.    Call ID:      PRESCRIPTION REFILL ONLY  Name of prescription:  Pharmacy:

## 2022-12-08 NOTE — Telephone Encounter (Signed)
I called and left a message asking Mom to call back. TG 

## 2022-12-08 NOTE — Telephone Encounter (Signed)
Mom called the nurse line during the Lunch hour and stated she was returning a callback to Elveria Rising.

## 2022-12-09 MED ORDER — ALPRAZOLAM 0.25 MG PO TABS: ORAL_TABLET | ORAL | 0 refills | Status: DC

## 2022-12-09 NOTE — Telephone Encounter (Signed)
Mom called back. She said that she and [name] were flying to [place] next week and that he is nervous about the flight. I recommended a dose of low dose Xanax 30 minutes prior to boarding and she agreed with this plan. TG

## 2022-12-28 ENCOUNTER — Other Ambulatory Visit (INDEPENDENT_AMBULATORY_CARE_PROVIDER_SITE_OTHER): Payer: Self-pay | Admitting: Family

## 2022-12-28 DIAGNOSIS — G250 Essential tremor: Secondary | ICD-10-CM

## 2022-12-28 DIAGNOSIS — G4700 Insomnia, unspecified: Secondary | ICD-10-CM

## 2023-01-29 ENCOUNTER — Other Ambulatory Visit (INDEPENDENT_AMBULATORY_CARE_PROVIDER_SITE_OTHER): Payer: Self-pay | Admitting: Family

## 2023-01-29 DIAGNOSIS — G40309 Generalized idiopathic epilepsy and epileptic syndromes, not intractable, without status epilepticus: Secondary | ICD-10-CM

## 2023-01-29 NOTE — Telephone Encounter (Signed)
Contacted patients mother.  Verified patients name and DOB as well as mothers name.   Attempted to schedule follow up appointment. Mother stated that she would call back to schedule.  SS, CCMA

## 2023-02-18 ENCOUNTER — Other Ambulatory Visit (INDEPENDENT_AMBULATORY_CARE_PROVIDER_SITE_OTHER): Payer: Self-pay | Admitting: Family

## 2023-02-18 DIAGNOSIS — G40309 Generalized idiopathic epilepsy and epileptic syndromes, not intractable, without status epilepticus: Secondary | ICD-10-CM

## 2023-04-19 ENCOUNTER — Other Ambulatory Visit (INDEPENDENT_AMBULATORY_CARE_PROVIDER_SITE_OTHER): Payer: Self-pay | Admitting: Pediatrics

## 2023-04-19 DIAGNOSIS — G40309 Generalized idiopathic epilepsy and epileptic syndromes, not intractable, without status epilepticus: Secondary | ICD-10-CM

## 2023-04-19 NOTE — Telephone Encounter (Addendum)
Last OV 07/09/2022 Next OV not sched was due in Aug. Requested Admin pool call to sched Felbatol- last Rx 02/19/2023 RF 2 Prednisone last rx 02/19/2023 RF x 2

## 2023-05-15 ENCOUNTER — Ambulatory Visit (HOSPITAL_COMMUNITY)
Admission: RE | Admit: 2023-05-15 | Discharge: 2023-05-15 | Disposition: A | Discharge status: Home / Self Care | Payer: MEDICAID | Source: Ambulatory Visit | Attending: Emergency Medicine | Admitting: Emergency Medicine

## 2023-05-15 ENCOUNTER — Telehealth (HOSPITAL_COMMUNITY): Payer: Self-pay

## 2023-05-15 ENCOUNTER — Encounter (HOSPITAL_COMMUNITY): Payer: Self-pay

## 2023-05-15 VITALS — BP 123/84 | HR 103 | Temp 97.8°F | Resp 18

## 2023-05-15 DIAGNOSIS — J069 Acute upper respiratory infection, unspecified: Secondary | ICD-10-CM | POA: Diagnosis not present

## 2023-05-15 LAB — POC COVID19/FLU A&B COMBO
Covid Antigen, POC: NEGATIVE
Influenza A Antigen, POC: NEGATIVE
Influenza B Antigen, POC: NEGATIVE

## 2023-05-15 MED ORDER — PROMETHAZINE-DM 6.25-15 MG/5ML PO SYRP: 5.0000 mL | ORAL_SOLUTION | Freq: Four times a day (QID) | ORAL | 0 refills | Status: DC | PRN

## 2023-05-15 NOTE — Discharge Instructions (Addendum)
 His COVID and flu testing were negative, he most likely has a different viral upper respiratory illness.  For cough you can given the syrup up to 4 times daily, this may cause drowsiness.  Ensure he is staying well-hydrated to help loosen his secretions you can also have him sleep with a humidifier.  Mucinex 1200 mg daily can also help.  Please follow-up with his primary care provider or return to clinic if no improvement in his symptoms over the next 7 days or so, or he develops any new concerning symptoms.

## 2023-05-15 NOTE — Telephone Encounter (Signed)
 Patient's guardian informed and verbalized understanding.

## 2023-05-15 NOTE — ED Provider Notes (Signed)
 [facility]    CSN: [id number] Arrival date & time: 05/15/23  1452      History   Chief Complaint Chief Complaint  Patient presents with   appt 3    HPI [name] is a 33 y.o. male.   Patient presents to clinic with his mother.  Mother provides most of the history.  Reports he has had a subjective fever and a cough that started on Wednesday.  He has had generic DayQuil and NyQuil.  He did try Tessalon  pearl that was his mother's, cough continues.  Mother reports cough is worse throughout the day.  He is having nasal congestion and rhinorrhea.  Denies any sore throat.  No vomiting or diarrhea.  History of intellectual delay, seizures and asthma.  The history is provided by the patient and a parent.    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 Authorizing Provider  promethazine -dextromethorphan (PROMETHAZINE -DM) 6.25-15 MG/5ML syrup Take 5 mLs by mouth 4 (four) times daily as needed for cough. 05/15/23  Yes Elayjah Chaney  N, FNP  acetaminophen  (TYLENOL ) 500 MG tablet Take 1 tablet (500 mg total) by mouth every 8 (eight) hours as needed for mild pain or fever. 01/15/20   Lenon Marien CROME, MD   ALPRAZolam  (XANAX ) 0.25 MG tablet Take 1 tablet 30 minutes before boarding the plane 12/09/22   Marianna City, NP  felbamate  (FELBATOL ) 600 MG tablet TAKE ONE TABLET BY MOUTH EVERY MORNING ONE-HALF TABLET EVERY NOON, AND TAKE ONE TABLET AT BEDTIME 04/19/23   Waddell Corean HERO, MD  lamoTRIgine  (LAMICTAL ) 150 MG tablet TAKE ONE TABLET BY MOUTH TWICE DAILY 11/30/22   Marianna City, NP  lamoTRIgine  (LAMICTAL ) 25 MG tablet TAKE ONE TABLET BY MOUTH TWICE DAILY 11/30/22   Marianna City, NP  levETIRAcetam  (KEPPRA ) 500 MG tablet TAKE ONE TABLET BY MOUTH TWICE DAILY 11/30/22   Marianna City, NP  ondansetron  (ZOFRAN ) 4 MG tablet Take 1 tablet (4 mg total) by mouth every 6 (six) hours. Patient not taking: Reported on 07/09/2022 12/26/21   Jerral Meth, MD  predniSONE  (DELTASONE ) 5 MG tablet Take 1 tablet (5 mg total) by mouth daily. 04/19/23   Waddell Corean HERO, MD  propranolol  (INDERAL ) 20 MG tablet give 1 tablet at bedtime 12/28/22   Marianna City, NP  traZODone  (DESYREL ) 50 MG tablet TAKE ONE TABLET BY MOUTH AT BEDTIME 12/28/22   Marianna City, NP    Family History Family History  Problem Relation Age of Onset   Asthma Mother    GER disease Mother    Thyroid disease Mother    Thyroid disease Sister    Leukemia Maternal Grandmother    Diabetes Maternal Grandmother    Heart disease Maternal  Grandmother    Seizures Neg Hx    Parkinsonism Neg Hx     Social History Social History   Tobacco Use   Smoking status: Never    Passive exposure: Never   Smokeless tobacco: Never  Substance Use Topics   Alcohol use: No    Alcohol/week: 0.0 standard drinks of alcohol   Drug use: No     Allergies   Dilantin [phenytoin sodium extended] and Chloral hydrate   Review of Systems Review of Systems  Per HPI   Physical Exam Triage Vital Signs ED Triage Vitals  Encounter Vitals Group     BP 05/15/23 1509 123/84     Systolic BP Percentile --      Diastolic BP Percentile --       Pulse Rate 05/15/23 1509 (!) 103     Resp 05/15/23 1509 18     Temp 05/15/23 1509 97.8 F (36.6 C)     Temp Source 05/15/23 1509 Oral     SpO2 05/15/23 1509 97 %     Weight --      Height --      Head Circumference --      Peak Flow --      Pain Score 05/15/23 1508 0     Pain Loc --      Pain Education --      Exclude from Growth Chart --    No data found.  Updated Vital Signs BP 123/84 (BP Location: Left Arm)   Pulse (!) 103   Temp 97.8 F (36.6 C) (Oral)   Resp 18   SpO2 97%   Visual Acuity Right Eye Distance:   Left Eye Distance:   Bilateral Distance:    Right Eye Near:   Left Eye Near:    Bilateral Near:     Physical Exam Vitals and nursing note reviewed.  Constitutional:      Appearance: Normal appearance.  HENT:     Head: Normocephalic and atraumatic.     Right Ear: External ear normal.     Left Ear: External ear normal.     Nose: Congestion and rhinorrhea present.     Mouth/Throat:     Mouth: Mucous membranes are moist.  Eyes:     Conjunctiva/sclera: Conjunctivae normal.  Cardiovascular:     Rate and Rhythm: Normal rate and regular rhythm.     Heart sounds: Normal heart sounds. No murmur heard. Pulmonary:     Effort: Pulmonary effort is normal. No respiratory distress.     Breath sounds: Normal breath sounds.  Musculoskeletal:        General: Normal range of motion.  Skin:    General: Skin is warm and dry.  Neurological:     General: No focal deficit present.     Mental Status: He is alert and oriented to person, place, and time.  Psychiatric:        Mood and Affect: Mood normal.        Behavior: Behavior normal.      UC Treatments / Results  Labs (all labs ordered are listed, but only abnormal results are displayed) Labs Reviewed  POC COVID19/FLU A&B COMBO    EKG   Radiology No results found.  Procedures Procedures (including critical care time)  Medications Ordered in UC Medications - No data to display  Initial  Impression / Assessment and Plan / UC Course  I have reviewed the triage vital signs and the nursing notes.  Pertinent labs & imaging  results that were available during my care of the patient were reviewed by me and considered in my medical decision making (see chart for details).  Vitals and triage reviewed, patient is hemodynamically stable.  Lungs are vesicular, heart with regular rate and rhythm.  Congestion and clear rhinorrhea present on physical exam.  Viral testing with COVID and flu is negative in clinic.  Suspect other viral URI causing symptoms of fever, cough and congestion.  Reassurance provided.  Cough management reviewed.  Plan of care, follow-up care return precautions given, no questions at this time.     Final Clinical Impressions(s) / UC Diagnoses   Final diagnoses:  Viral URI with cough     Discharge Instructions      His COVID and flu testing were negative, he most likely has a different viral upper respiratory illness.  For cough you can given the syrup up to 4 times daily, this may cause drowsiness.  Ensure he is staying well-hydrated to help loosen his secretions you can also have him sleep with a humidifier.  Mucinex 1200 mg daily can also help.  Please follow-up with his primary care provider or return to clinic if no improvement in his symptoms over the next 7 days or so, or he develops any new concerning symptoms.      ED Prescriptions     Medication Sig Dispense Auth. Provider   promethazine -dextromethorphan (PROMETHAZINE -DM) 6.25-15 MG/5ML syrup Take 5 mLs by mouth 4 (four) times daily as needed for cough. 118 mL Dreama, Camry Theiss  N, FNP      PDMP not reviewed this encounter.   Dreama, Danya Spearman  N, FNP 05/15/23 1550

## 2023-05-15 NOTE — Telephone Encounter (Signed)
 Patient calling in and states that his insurance will not cover promethazine -dextromethorphan (PROMETHAZINE -DM) 6.25-15 MG/5ML syrup.   Spoke with the provider that saw and treated the Patient today. Was informed to tell the Patient they can take otc Robitussin in its place.

## 2023-05-15 NOTE — ED Triage Notes (Signed)
 Pt had fever and cough that started Wed. Took cold day and night, Tessalon that was mother's Rx.

## 2023-05-17 ENCOUNTER — Other Ambulatory Visit (INDEPENDENT_AMBULATORY_CARE_PROVIDER_SITE_OTHER): Payer: Self-pay | Admitting: Family

## 2023-05-17 DIAGNOSIS — G40309 Generalized idiopathic epilepsy and epileptic syndromes, not intractable, without status epilepticus: Secondary | ICD-10-CM

## 2023-05-17 DIAGNOSIS — G250 Essential tremor: Secondary | ICD-10-CM

## 2023-05-17 DIAGNOSIS — G4700 Insomnia, unspecified: Secondary | ICD-10-CM

## 2023-05-28 ENCOUNTER — Encounter (INDEPENDENT_AMBULATORY_CARE_PROVIDER_SITE_OTHER): Payer: Self-pay | Admitting: Family

## 2023-06-14 ENCOUNTER — Other Ambulatory Visit (INDEPENDENT_AMBULATORY_CARE_PROVIDER_SITE_OTHER): Payer: Self-pay | Admitting: Pediatrics

## 2023-06-14 DIAGNOSIS — G40309 Generalized idiopathic epilepsy and epileptic syndromes, not intractable, without status epilepticus: Secondary | ICD-10-CM

## 2023-06-25 ENCOUNTER — Encounter (INDEPENDENT_AMBULATORY_CARE_PROVIDER_SITE_OTHER): Payer: Self-pay | Admitting: Family

## 2023-07-08 ENCOUNTER — Encounter (INDEPENDENT_AMBULATORY_CARE_PROVIDER_SITE_OTHER): Payer: Self-pay | Admitting: Family

## 2023-07-08 ENCOUNTER — Ambulatory Visit (INDEPENDENT_AMBULATORY_CARE_PROVIDER_SITE_OTHER): Payer: MEDICAID | Admitting: Family

## 2023-07-08 VITALS — BP 122/76 | HR 64 | Ht 66.14 in | Wt 203.0 lb

## 2023-07-08 DIAGNOSIS — G250 Essential tremor: Secondary | ICD-10-CM | POA: Diagnosis not present

## 2023-07-08 DIAGNOSIS — R635 Abnormal weight gain: Secondary | ICD-10-CM

## 2023-07-08 DIAGNOSIS — G40309 Generalized idiopathic epilepsy and epileptic syndromes, not intractable, without status epilepticus: Secondary | ICD-10-CM

## 2023-07-08 DIAGNOSIS — F71 Moderate intellectual disabilities: Secondary | ICD-10-CM

## 2023-07-08 DIAGNOSIS — G4700 Insomnia, unspecified: Secondary | ICD-10-CM

## 2023-07-08 DIAGNOSIS — F801 Expressive language disorder: Secondary | ICD-10-CM

## 2023-07-08 NOTE — Progress Notes (Signed)
 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 He was diagnosed with GERD and a [employer] fundoplication procedure stopped the vomiting. He also has organic gait disorder, essential tremor and stuttering. He is taking and tolerating Prednisone, Felbatol, Lamictal, and Keppra, and has remained largely seizure free for some time. He has been prescribed Propranolol for essential tremor and Trazodone for sleep.  Today's concerns: Mom reports that [name] has not experienced convulsive seizures but has had recent episodes of staring behavior. She was concerned about him and stayed home from work today to monitor him.  Mom reports that there has been some unusual stress in the home and she wonders if that has affected [name].  [name] stays at home with his mother. She reports that he sometimes has erratic sleep patterns. She denies any missed medication doses.  [name] has been otherwise generally healthy since he was last seen. No health concerns today other than previously mentioned.  Review of systems: Please see HPI for neurologic and other pertinent review of systems. Otherwise all other systems were reviewed and were negative.  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 He has been admitted to [facility] with recurrent seizures. He was hospitalized November 08, 2004 with involuntary movements that were choreiform in nature. He has significant cognitive impairments, pervasive developmental delays in many domains, but no focal or lateralized neurologic findings.   MRI performed October 11, 2002 was normal. EEG Oct 05 2002 showed diffuse background slowing and multifocal sharply contoured slow-wave is principally in the central, parietal and temporal regions, right greater than left  Surgical history: Past Surgical History:  Procedure Laterality Date   NISSEN FUNDOPLICATION  April 2009     Family history: family history includes Asthma in his mother; Diabetes in his maternal grandmother; GER disease in his mother; Heart disease in his maternal grandmother; Leukemia in his maternal grandmother; Thyroid disease in his mother and sister.   Social history: Social History   Socioeconomic History   Marital status: Single    Spouse name: Not on file   Number of children: Not on file   Years of education: Not on file   Highest education level: Not on file  Occupational History   Not on file  Tobacco Use   Smoking status: Never  Passive exposure: Never   Smokeless tobacco: Never  Substance and Sexual Activity   Alcohol use: No    Alcohol/week: 0.0 standard drinks of alcohol   Drug use: No   Sexual activity:  Never  Other Topics Concern   Not on file  Social History Narrative   [name] refuses to attend the [employer].    He lives with his mother and mother's boyfriend.    He enjoys basketball, riding his bike, and watching wrestling.    Social Drivers of Corporate investment banker Strain: Not on file  Food Insecurity: Not on file  Transportation Needs: Not on file  Physical Activity: Not on file  Stress: Not on file  Social Connections: Not on file  Intimate Partner Violence: Not on file    Past/failed meds: Copied from previous record: Dilantin - hallucinations   Allergies: Allergies  Allergen Reactions   Dilantin [Phenytoin Sodium Extended] Other (See Comments)    hallucinations   Chloral Hydrate Hives and Rash    Immunizations: Immunization History  Administered Date(s) Administered   Tdap 12/06/2017    Diagnostics/Screenings: Copied from previous record: 06/01/17 - rEEG - normal   Physical Exam: BP 122/76   Pulse 64   Ht 5' 6.14" (1.68 m)   Wt 203 lb 0.7 oz (92.1 kg)   BMI 32.63 kg/m   Wt Readings from Last 3 Encounters:  07/08/23 203 lb 0.7 oz (92.1 kg)  07/09/22 176 lb 8 oz (80.1 kg)  05/27/22 160 lb 0.9 oz (72.6 kg)    General: Well developed, well nourished man, seated on exam table, in no evident distress Head: Head normocephalic and atraumatic.  Oropharynx benign. Neck: Supple Cardiovascular: Regular rate and rhythm, no murmurs Respiratory: Breath sounds clear to auscultation Musculoskeletal: No obvious deformities or scoliosis Skin: No rashes or neurocutaneous lesions  Neurologic Exam Mental Status: Awake and fully alert. Limited speech. Variable eye contact. Had difficulty following instructions, needed coaching during the examination. Cranial Nerves: Fundoscopic exam reveals sharp disc margins.  Pupils equal, briskly reactive to light.  Turns to localize faces, objects and sounds in the periphery. Facial movements symmetric.  Motor: Normal  functional bulk, tone and strength. Has bilateral outstretched hand tremor Sensory: Withdrawal x 4 Coordination: No dysmetria when reaching for objects. Could not understand instructions for Romberg Gait and Station: Normal gait  Impression: Generalized convulsive epilepsy (HCC) - Plan: felbamate (FELBATOL) 600 MG tablet, lamoTRIgine (LAMICTAL) 150 MG tablet, lamoTRIgine (LAMICTAL) 25 MG tablet, levETIRAcetam (KEPPRA) 500 MG tablet, predniSONE (DELTASONE) 5 MG tablet  Generalized nonconvulsive epilepsy (HCC) - Plan: felbamate (FELBATOL) 600 MG tablet, lamoTRIgine (LAMICTAL) 150 MG tablet, lamoTRIgine (LAMICTAL) 25 MG tablet, levETIRAcetam (KEPPRA) 500 MG tablet, predniSONE (DELTASONE) 5 MG tablet  Moderate intellectual disabilities  Expressive speech disorder  Tremor, essential - Plan: propranolol (INDERAL) 20 MG tablet  Insomnia, unspecified type - Plan: traZODone (DESYREL) 50 MG tablet  Weight gain   Recommendations for plan of care: The patient's previous Epic records were reviewed. No recent diagnostic studies to be reviewed with the patient. I talked with Mom about the staring events and told her that if it continues that we will perform an EEG to evaluate. I also talked with her about his weight gain and my concern about disorders associated with obesity. I would like to consider tapering him off Prednisone as he has been taking it since childhood but Mom is very opposed to doing so because of fear of seizure recurrence that would be difficult to  control. I recommended to Mom that Joal follow up with his PCP to be screened for disorders such as diabetes and hypercholesterolemia, and that Mom work on limiting his portion sizes and snacks, as well as helping him to be more active. She agreed with these plans.   Plan until next visit: Continue medications as prescribed  Call if seizures occur, if staring spells continue or for other questions or concerns Follow up with PCP as  discussed and work on weight loss for Darren Note given for [employer] employer about missing work Return in about 1 year (around 07/07/2024).  The medication list was reviewed and reconciled. No changes were made in the prescribed medications today. A complete medication list was provided to the patient.  Allergies as of 07/08/2023       Reactions   Dilantin [phenytoin Sodium Extended] Other (See Comments)   hallucinations   Chloral Hydrate Hives, Rash        Medication List        Accurate as of July 08, 2023 11:59 PM. If you have any questions, ask your nurse or doctor.          STOP taking these medications    ALPRAZolam 0.25 MG tablet Commonly known as: Xanax Stopped by: Elveria Rising   ondansetron 4 MG tablet Commonly known as: ZOFRAN Stopped by: Elveria Rising   promethazine-dextromethorphan 6.25-15 MG/5ML syrup Commonly known as: PROMETHAZINE-DM Stopped by: Elveria Rising       TAKE these medications    acetaminophen 500 MG tablet Commonly known as: TYLENOL Take 1 tablet (500 mg total) by mouth every 8 (eight) hours as needed for mild pain or fever.   felbamate 600 MG tablet Commonly known as: FELBATOL TAKE ONE TABLET BY MOUTH EVERY MORNING ONE-HALF TABLET EVERY NOON, AND TAKE ONE TABLET AT BEDTIME   lamoTRIgine 150 MG tablet Commonly known as: LAMICTAL Take 1 tablet (150 mg total) by mouth 2 (two) times daily.   lamoTRIgine 25 MG tablet Commonly known as: LAMICTAL Take 1 tablet (25 mg total) by mouth 2 (two) times daily.   levETIRAcetam 500 MG tablet Commonly known as: KEPPRA Take 1 tablet (500 mg total) by mouth 2 (two) times daily.   predniSONE 5 MG tablet Commonly known as: DELTASONE Take 1 tablet (5 mg total) by mouth daily.   propranolol 20 MG tablet Commonly known as: INDERAL Take 1 tablet (20 mg total) by mouth at bedtime.   traZODone 50 MG tablet Commonly known as: DESYREL Take 1 tablet (50 mg total) by mouth at  bedtime.      Total time spent with the patient was 30 minutes, of which 50% or more was spent in counseling and coordination of care.  Elveria Rising NP-C [facility] [address], [address] [place], [place] [address] Ph. [phone] Fax [phone]

## 2023-07-08 NOTE — Patient Instructions (Signed)
 It was a pleasure to see you today!  Wt Readings from Last 3 Encounters:  07/08/23 203 lb 0.7 oz (92.1 kg)  07/09/22 176 lb 8 oz (80.1 kg)  05/27/22 160 lb 0.9 oz (72.6 kg)     Instructions for you until your next appointment are as follows: Continue giving Kameryn his medications as prescribed Let me know if he has seizures I have written a note for your employer I am concerned about his weight gain. Work on limiting portions and snacks, as well as helping him to be more active.  Follow up with his PCP as Sigismund is at risk for problems related to being overweight such as diabetes and high cholesterol  Please sign up for MyChart if you have not done so. Please plan to return for follow up in  or sooner if needed.  Feel free to contact our office during normal business hours at [phone] with questions or concerns. If there is no answer or the call is outside business hours, please leave a message and our clinic staff will call you back within the next business day.  If you have an urgent concern, please stay on the line for our after-hours answering service and ask for the on-call neurologist.     I also encourage you to use MyChart to communicate with me more directly. If you have not yet signed up for MyChart within [facility], the front desk staff can help you. However, please note that this inbox is NOT monitored on nights or weekends, and response can take up to 2 business days.  Urgent matters should be discussed with the on-call pediatric neurologist.   At [facility], we are committed to providing exceptional care. You will receive a patient satisfaction survey through text or email regarding your visit today. Your opinion is important to me. Comments are appreciated.

## 2023-07-09 ENCOUNTER — Encounter (INDEPENDENT_AMBULATORY_CARE_PROVIDER_SITE_OTHER): Payer: Self-pay

## 2023-07-09 ENCOUNTER — Other Ambulatory Visit (INDEPENDENT_AMBULATORY_CARE_PROVIDER_SITE_OTHER): Payer: MEDICAID

## 2023-07-11 ENCOUNTER — Encounter (INDEPENDENT_AMBULATORY_CARE_PROVIDER_SITE_OTHER): Payer: Self-pay | Admitting: Family

## 2023-07-11 DIAGNOSIS — R635 Abnormal weight gain: Secondary | ICD-10-CM | POA: Insufficient documentation

## 2023-07-11 MED ORDER — LEVETIRACETAM 500 MG PO TABS: 500.0000 mg | ORAL_TABLET | Freq: Two times a day (BID) | ORAL | 5 refills | Status: DC

## 2023-07-11 MED ORDER — FELBAMATE 600 MG PO TABS: ORAL_TABLET | ORAL | 5 refills | Status: DC

## 2023-07-11 MED ORDER — PROPRANOLOL HCL 20 MG PO TABS: 20.0000 mg | ORAL_TABLET | Freq: Every day | ORAL | 5 refills | Status: DC

## 2023-07-11 MED ORDER — PREDNISONE 5 MG PO TABS: 5.0000 mg | ORAL_TABLET | Freq: Every day | ORAL | 5 refills | Status: DC

## 2023-07-11 MED ORDER — LAMOTRIGINE 25 MG PO TABS: 25.0000 mg | ORAL_TABLET | Freq: Two times a day (BID) | ORAL | 5 refills | Status: DC

## 2023-07-11 MED ORDER — LAMOTRIGINE 150 MG PO TABS: 150.0000 mg | ORAL_TABLET | Freq: Two times a day (BID) | ORAL | 5 refills | Status: DC

## 2023-07-11 MED ORDER — TRAZODONE HCL 50 MG PO TABS: 50.0000 mg | ORAL_TABLET | Freq: Every day | ORAL | 5 refills | Status: DC

## 2023-07-12 ENCOUNTER — Telehealth (INDEPENDENT_AMBULATORY_CARE_PROVIDER_SITE_OTHER): Payer: Self-pay | Admitting: Pharmacy Technician

## 2023-07-12 ENCOUNTER — Other Ambulatory Visit (HOSPITAL_COMMUNITY): Payer: Self-pay

## 2023-07-12 ENCOUNTER — Telehealth (INDEPENDENT_AMBULATORY_CARE_PROVIDER_SITE_OTHER): Payer: Self-pay | Admitting: Family

## 2023-07-12 NOTE — Telephone Encounter (Signed)
 Contacted patients mother.  Verified patients name and DOB as well as mothers name.   Mom stated that her FMLA was approved.   Mom stated that instead of taking off a full 12 weeks, she'll only be working 3 days out of the week. Wednesday, Thursday and Friday - starting today.   Mom asked for our fax number so that her HR representative could fax over the forms.  I gave mom [phone] fax number.  SS, CCMA

## 2023-07-12 NOTE — Telephone Encounter (Signed)
  Name of who is calling: Acosta,Cynthia  Caller's Relationship to Patient: mother  Best contact number: [phone]  Provider they see: [facility]   Reason for call: FMLA -  MOM WILL LIKE A CALL BACK      PRESCRIPTION REFILL ONLY  Name of prescription:  Pharmacy:

## 2023-07-12 NOTE — Telephone Encounter (Signed)
 Pharmacy Patient Advocate Encounter   Received notification from CoverMyMeds that prior authorization for Felbamate 600MG  tablets is required/requested.   Insurance verification completed.   The patient is insured through [facility] .   Per test claim: Medicaid will only cover specific NDC's (manufacturers). Spoke to [employer] and they received a $0.00 paid claim.

## 2023-07-12 NOTE — Telephone Encounter (Signed)
  Name of who is calling: Acosta,Cynthia   Caller's Relationship to Patient: mother   Best contact number: [phone]   Provider they see: [facility]    Reason for call: MOM WILL LIKE A CALL BACK

## 2023-07-14 ENCOUNTER — Telehealth (INDEPENDENT_AMBULATORY_CARE_PROVIDER_SITE_OTHER): Payer: Self-pay | Admitting: Family

## 2023-07-14 ENCOUNTER — Telehealth (INDEPENDENT_AMBULATORY_CARE_PROVIDER_SITE_OTHER): Payer: Self-pay | Admitting: Primary Care

## 2023-07-14 NOTE — Telephone Encounter (Signed)
  Name of who is calling: Acosta,Cynthia   Caller's Relationship to Patient: Mother   Best contact number: [phone]   Provider they see: Inetta Fermo   Reason for call: patient's mother called to follow up on the [facility] paperwork and make sure it had been received.

## 2023-07-14 NOTE — Telephone Encounter (Signed)
 Called pt to remind them about appt. Pt will be present.

## 2023-07-15 ENCOUNTER — Ambulatory Visit (INDEPENDENT_AMBULATORY_CARE_PROVIDER_SITE_OTHER): Payer: MEDICAID | Admitting: Primary Care

## 2023-07-15 VITALS — BP 136/85 | HR 86 | Temp 98.2°F | Ht 66.0 in | Wt 198.4 lb

## 2023-07-15 DIAGNOSIS — Z6832 Body mass index (BMI) 32.0-32.9, adult: Secondary | ICD-10-CM | POA: Diagnosis not present

## 2023-07-15 DIAGNOSIS — Z79899 Other long term (current) drug therapy: Secondary | ICD-10-CM | POA: Diagnosis not present

## 2023-07-15 DIAGNOSIS — R635 Abnormal weight gain: Secondary | ICD-10-CM | POA: Diagnosis not present

## 2023-07-15 NOTE — Progress Notes (Signed)
 [facility]  [name], is a 33 y.o. male  ZOX:[id number]  WJX:[id number]  DOB - 22-Mar-1991  Chief Complaint  Patient presents with   Medical Management of Chronic Issues       Subjective:   [name] is a 33 y.o. male here today for a follow up visit. Patient has No headache, No chest pain, No abdominal pain - No Nausea, No new weakness tingling or numbness, No Cough - shortness of breath  No problems updated.  Comprehensive ROS Pertinent positive and negative noted in HPI   Allergies  Allergen Reactions   Dilantin [Phenytoin Sodium Extended] Other (See Comments)    hallucinations   Chloral Hydrate Hives and Rash    Past Medical History:  Diagnosis Date   Asthma    Intellectual delay    Seizures (HCC)     Current Outpatient Medications on File Prior to Visit  Medication Sig Dispense Refill   acetaminophen (TYLENOL) 500 MG tablet Take 1 tablet (500 mg total) by mouth every 8 (eight) hours as needed for mild pain or fever. 30 tablet 0   felbamate (FELBATOL) 600 MG tablet TAKE ONE TABLET BY MOUTH EVERY MORNING ONE-HALF TABLET EVERY NOON, AND TAKE ONE TABLET AT BEDTIME 75 tablet 5   lamoTRIgine (LAMICTAL) 150 MG tablet Take 1 tablet (150 mg total) by mouth 2 (two) times daily. 60 tablet 5   lamoTRIgine (LAMICTAL) 25 MG tablet Take 1 tablet (25 mg total) by mouth 2 (two) times daily. 60 tablet 5   levETIRAcetam (KEPPRA) 500 MG tablet Take 1 tablet (500 mg total) by mouth 2 (two) times daily. 60 tablet 5   predniSONE (DELTASONE) 5 MG tablet Take 1 tablet (5 mg total) by mouth daily. 30 tablet 5   propranolol (INDERAL) 20 MG tablet Take 1 tablet (20 mg total) by mouth at bedtime. 30 tablet 5   traZODone (DESYREL) 50 MG tablet Take 1 tablet (50 mg total) by mouth at bedtime. 30 tablet 5   No current facility-administered medications on file prior to visit.   Health Maintenance  Topic Date Due   COVID-19 Vaccine (1) Never done   Hepatitis C  Screening  Never done   Flu Shot  Never done   DTaP/Tdap/Td vaccine (2 - Td or Tdap) 12/07/2027   HIV Screening  Completed   HPV Vaccine  Aged Out    Objective:  BP 136/85   Pulse 86   Temp 98.2 F (36.8 C) (Oral)   Ht 5\' 6"  (1.676 m)   Wt 198 lb 6.4 oz (90 kg)   SpO2 97%   BMI 32.02 kg/m    Physical Exam Vitals reviewed.  Constitutional:      Appearance: He is obese.  HENT:     Head: Normocephalic.     Right Ear: Tympanic membrane and external ear normal.     Left Ear: Tympanic membrane and external ear normal.     Nose: Nose normal.  Eyes:     Extraocular Movements: Extraocular movements intact.     Pupils: Pupils are equal, round, and reactive to light.  Cardiovascular:     Rate and Rhythm: Normal rate and regular rhythm.  Pulmonary:     Effort: Pulmonary effort is normal.     Breath sounds: Normal breath sounds.  Abdominal:     General: Bowel sounds are normal. There is distension.     Palpations: Abdomen is soft.  Musculoskeletal:        General: Normal range of  motion.     Cervical back: Normal range of motion.  Skin:    General: Skin is warm and dry.  Neurological:     Mental Status: He is oriented to person, place, and time.  Psychiatric:        Mood and Affect: Mood normal.        Behavior: Behavior normal.        Thought Content: Thought content normal.        Judgment: Judgment normal.     Assessment & Plan  [name] was seen today for medical management of chronic issues.  Diagnoses and all orders for this visit:  History of long-term treatment with high-risk medication -     CBC with Differential -     CMP14+EGFR -     Lipid Panel -     Hemoglobin A1c  Weight gain -     Hemoglobin A1c -     TSH + free T4  Class 1 obesity due to excess calories without serious comorbidity with body mass index (BMI) of 32.0 to 32.9 in adult Obesity is 30-39 indicating an excess in caloric intake or underlining conditions. This may lead to other  co-morbidities. Educated on lifestyle modifications of diet and exercise which may reduce obesity.      Patient have been counseled extensively about nutrition and exercise. Other issues discussed during this visit include: low cholesterol diet, weight control and daily exercise, foot care, annual eye examinations at Ophthalmology, importance of adherence with medications and regular follow-up. We also discussed long term complications of uncontrolled diabetes and hypertension.   3 months hyperlipidemia  The patient was given clear instructions to go to ER or return to medical center if symptoms don't improve, worsen or new problems develop. The patient verbalized understanding. The patient was told to call to get lab results if they haven't heard anything in the next week.   This note has been created with Education officer, environmental. Any transcriptional errors are unintentional.   Grayce Sessions, NP 07/23/2023, 1:28 PM

## 2023-07-17 LAB — CMP14+EGFR
ALT: 20 IU/L (ref 0–44)
AST: 18 IU/L (ref 0–40)
Albumin: 5 g/dL (ref 4.3–5.2)
Alkaline Phosphatase: 107 IU/L (ref 44–121)
BUN/Creatinine Ratio: 11 (ref 9–20)
BUN: 13 mg/dL (ref 6–20)
Bilirubin Total: 0.5 mg/dL (ref 0.0–1.2)
CO2: 23 mmol/L (ref 20–29)
Calcium: 10 mg/dL (ref 8.7–10.2)
Chloride: 101 mmol/L (ref 96–106)
Creatinine, Ser: 1.15 mg/dL (ref 0.76–1.27)
Globulin, Total: 2.9 g/dL (ref 1.5–4.5)
Glucose: 111 mg/dL — ABNORMAL HIGH (ref 70–99)
Potassium: 4.3 mmol/L (ref 3.5–5.2)
Sodium: 140 mmol/L (ref 134–144)
Total Protein: 7.9 g/dL (ref 6.0–8.5)
eGFR: 88 mL/min/{1.73_m2} (ref >59)

## 2023-07-17 LAB — LIPID PANEL
Chol/HDL Ratio: 6.6 ratio — ABNORMAL HIGH (ref 0.0–5.0)
Cholesterol, Total: 226 mg/dL — ABNORMAL HIGH (ref 100–199)
HDL: 34 mg/dL — ABNORMAL LOW (ref >39)
LDL Chol Calc (NIH): 170 mg/dL — ABNORMAL HIGH (ref 0–99)
Triglycerides: 118 mg/dL (ref 0–149)
VLDL Cholesterol Cal: 22 mg/dL (ref 5–40)

## 2023-07-17 LAB — CBC WITH DIFFERENTIAL/PLATELET
Basophils Absolute: 0.1 10*3/uL (ref 0.0–0.2)
Basos: 1 %
EOS (ABSOLUTE): 0.2 10*3/uL (ref 0.0–0.4)
Eos: 2 %
Hematocrit: 52.3 % — ABNORMAL HIGH (ref 37.5–51.0)
Hemoglobin: 17.3 g/dL (ref 13.0–17.7)
Immature Grans (Abs): 0 10*3/uL (ref 0.0–0.1)
Immature Granulocytes: 0 %
Lymphocytes Absolute: 3.4 10*3/uL — ABNORMAL HIGH (ref 0.7–3.1)
Lymphs: 38 %
MCH: 30.4 pg (ref 26.6–33.0)
MCHC: 33.1 g/dL (ref 31.5–35.7)
MCV: 92 fL (ref 79–97)
Monocytes Absolute: 0.7 10*3/uL (ref 0.1–0.9)
Monocytes: 8 %
Neutrophils Absolute: 4.6 10*3/uL (ref 1.4–7.0)
Neutrophils: 51 %
Platelets: 479 10*3/uL — ABNORMAL HIGH (ref 150–450)
RBC: 5.7 x10E6/uL (ref 4.14–5.80)
RDW: 12.9 % (ref 11.6–15.4)
WBC: 9 10*3/uL (ref 3.4–10.8)

## 2023-07-17 LAB — TSH+FREE T4
Free T4: 1.38 ng/dL (ref 0.82–1.77)
TSH: 2.22 u[IU]/mL (ref 0.450–4.500)

## 2023-07-17 LAB — HEMOGLOBIN A1C
Est. average glucose Bld gHb Est-mCnc: 105 mg/dL
Hgb A1c MFr Bld: 5.3 % (ref 4.8–5.6)

## 2023-07-18 ENCOUNTER — Other Ambulatory Visit (INDEPENDENT_AMBULATORY_CARE_PROVIDER_SITE_OTHER): Payer: Self-pay | Admitting: Primary Care

## 2023-07-18 DIAGNOSIS — E782 Mixed hyperlipidemia: Secondary | ICD-10-CM

## 2023-07-18 MED ORDER — ROSUVASTATIN CALCIUM 40 MG PO TABS: 40.0000 mg | ORAL_TABLET | Freq: Every day | ORAL | 3 refills | Status: DC

## 2023-07-20 ENCOUNTER — Encounter (INDEPENDENT_AMBULATORY_CARE_PROVIDER_SITE_OTHER): Payer: Self-pay

## 2023-07-22 NOTE — Telephone Encounter (Signed)
 I called and spoke with Mom. She needs FMLA form to be completed for 12 weeks out now, and in the future she will need 2 be off 2 days per week if needed to care for [place]. She asked for the form to be faxed to Grac[employer] Nurse at [phone]. TG

## 2023-07-22 NOTE — Telephone Encounter (Signed)
 Mom called stated that she is needing a update on FMLA paperwork. [phone].

## 2023-07-23 ENCOUNTER — Encounter (INDEPENDENT_AMBULATORY_CARE_PROVIDER_SITE_OTHER): Payer: Self-pay | Admitting: Primary Care

## 2023-07-27 NOTE — Telephone Encounter (Signed)
 Mom([name]) has called back stating that forms have been received, but will be sent back. Mom is wanting a call back from Inetta Fermo so that she can let Inetta Fermo know what HR has stated.

## 2023-07-27 NOTE — Telephone Encounter (Signed)
 Contacted patients mother.  Verified patients name and DOB as well as mothers name.   Informed mom of Mrs. Inetta Fermo being out of the office.  Mom stated that she would prefer to speak to Mrs, Inetta Fermo directly. Mom is willing to wait until tomorrow to speak with Mrs. Inetta Fermo.   SS, CCMA

## 2023-07-28 ENCOUNTER — Telehealth (INDEPENDENT_AMBULATORY_CARE_PROVIDER_SITE_OTHER): Payer: Self-pay | Admitting: Family

## 2023-07-28 NOTE — Telephone Encounter (Signed)
  Name of who is calling: Tama Headings Relationship to Patient: mom  Best contact number: [phone]  Provider they see: Inetta Fermo  Reason for call: mom wants Inetta Fermo to call her back to discuss ppwk needed for pt     PRESCRIPTION REFILL ONLY  Name of prescription:  Pharmacy:

## 2023-07-28 NOTE — Telephone Encounter (Signed)
 I called and spoke with Mom. She needs a letter for her employer that she can work 3 days per week. She has applied for FMLA and needs the amendment in place. She asked for the letter to be faxed to Grac[employer] Nurse at [phone]. I wrote the letter and will fax it as requested. TG

## 2023-09-27 ENCOUNTER — Telehealth (INDEPENDENT_AMBULATORY_CARE_PROVIDER_SITE_OTHER): Payer: Self-pay | Admitting: Family

## 2023-09-27 NOTE — Telephone Encounter (Signed)
 Who's calling (name and relationship to patient) : Fronie Jewett; mom   Best contact number: [phone]  Provider they see: Lyndol Santee, NP   Reason for call: Mom called in stating that  FMLA papers will be resent over to the office. Mom is requesting for Brian Campanile to call her back, before she send the papers back to her job.    Call ID:      PRESCRIPTION REFILL ONLY  Name of prescription:  Pharmacy:

## 2023-09-28 NOTE — Telephone Encounter (Signed)
 Mom called back to follow up, wanting to make sure [name] received fax, and for [name] to also contact her before send FMLA papers back.

## 2023-09-28 NOTE — Telephone Encounter (Signed)
 Attempted to contact patients mother. Mother unable to be reached.  LVM informing her that the paperwork was not received also provided our fax number on that voicemail.  SS, CCMA

## 2023-09-28 NOTE — Telephone Encounter (Signed)
 Mom is calling to follow up on a note that was left earlier. She would like a callback at [phone].

## 2023-09-29 NOTE — Telephone Encounter (Signed)
 I called and let Mom know that I have not yet received the FMLA form. She will ask her employer to send it to me again. TG

## 2023-10-07 NOTE — Telephone Encounter (Signed)
 I received the FMLA form today, completed it and returned it to her employer. TG

## 2023-10-08 NOTE — Telephone Encounter (Signed)
 Mom called back regarding paperwork, she states that everything was done correctly she is just needing a date adjusted. She says that it should be until the end of the school year for when she returns to work. She would like a call back from [place], says she will provide more details then. [phone].

## 2023-10-11 NOTE — Telephone Encounter (Signed)
 Updated FMLA form for Mom to be out on leave until school starts in August sent to employer.

## 2023-12-06 ENCOUNTER — Other Ambulatory Visit (INDEPENDENT_AMBULATORY_CARE_PROVIDER_SITE_OTHER): Payer: Self-pay | Admitting: Family

## 2023-12-06 DIAGNOSIS — G40309 Generalized idiopathic epilepsy and epileptic syndromes, not intractable, without status epilepticus: Secondary | ICD-10-CM

## 2024-01-03 ENCOUNTER — Other Ambulatory Visit (INDEPENDENT_AMBULATORY_CARE_PROVIDER_SITE_OTHER): Payer: Self-pay | Admitting: Family

## 2024-01-03 DIAGNOSIS — G40309 Generalized idiopathic epilepsy and epileptic syndromes, not intractable, without status epilepticus: Secondary | ICD-10-CM

## 2024-01-12 ENCOUNTER — Emergency Department (HOSPITAL_COMMUNITY): Payer: MEDICAID

## 2024-01-12 ENCOUNTER — Other Ambulatory Visit: Payer: Self-pay

## 2024-01-12 ENCOUNTER — Encounter (HOSPITAL_COMMUNITY): Payer: Self-pay | Admitting: *Deleted

## 2024-01-12 ENCOUNTER — Emergency Department (HOSPITAL_COMMUNITY)
Admission: EM | Admit: 2024-01-12 | Discharge: 2024-01-12 | Disposition: A | Discharge status: Home / Self Care | Payer: MEDICAID

## 2024-01-12 DIAGNOSIS — U071 COVID-19: Secondary | ICD-10-CM | POA: Insufficient documentation

## 2024-01-12 DIAGNOSIS — G40909 Epilepsy, unspecified, not intractable, without status epilepticus: Secondary | ICD-10-CM | POA: Diagnosis not present

## 2024-01-12 DIAGNOSIS — R059 Cough, unspecified: Secondary | ICD-10-CM | POA: Diagnosis present

## 2024-01-12 DIAGNOSIS — R799 Abnormal finding of blood chemistry, unspecified: Secondary | ICD-10-CM | POA: Insufficient documentation

## 2024-01-12 LAB — URINALYSIS, ROUTINE W REFLEX MICROSCOPIC
Bacteria, UA: NONE SEEN
Bilirubin Urine: NEGATIVE
Glucose, UA: NEGATIVE mg/dL
Ketones, ur: 20 mg/dL — AB
Leukocytes,Ua: NEGATIVE
Nitrite: NEGATIVE
Protein, ur: 30 mg/dL — AB
Specific Gravity, Urine: 1.021 (ref 1.005–1.030)
pH: 5 (ref 5.0–8.0)

## 2024-01-12 LAB — COMPREHENSIVE METABOLIC PANEL WITH GFR
ALT: 38 U/L (ref 0–44)
AST: 29 U/L (ref 15–41)
Albumin: 4.5 g/dL (ref 3.5–5.0)
Alkaline Phosphatase: 91 U/L (ref 38–126)
Anion gap: 13 (ref 5–15)
BUN: 13 mg/dL (ref 6–20)
CO2: 24 mmol/L (ref 22–32)
Calcium: 9 mg/dL (ref 8.9–10.3)
Chloride: 100 mmol/L (ref 98–111)
Creatinine, Ser: 1.02 mg/dL (ref 0.61–1.24)
GFR, Estimated: >60 mL/min (ref >60)
Glucose, Bld: 113 mg/dL — ABNORMAL HIGH (ref 70–99)
Potassium: 3.8 mmol/L (ref 3.5–5.1)
Sodium: 137 mmol/L (ref 135–145)
Total Bilirubin: 1.1 mg/dL (ref 0.0–1.2)
Total Protein: 8.1 g/dL (ref 6.5–8.1)

## 2024-01-12 LAB — CBC WITH DIFFERENTIAL/PLATELET
Abs Immature Granulocytes: 0.02 K/uL (ref 0.00–0.07)
Basophils Absolute: 0.1 K/uL (ref 0.0–0.1)
Basophils Relative: 1 %
Eosinophils Absolute: 0.1 K/uL (ref 0.0–0.5)
Eosinophils Relative: 2 %
HCT: 49.2 % (ref 39.0–52.0)
Hemoglobin: 17.4 g/dL — ABNORMAL HIGH (ref 13.0–17.0)
Immature Granulocytes: 0 %
Lymphocytes Relative: 27 %
Lymphs Abs: 2.3 K/uL (ref 0.7–4.0)
MCH: 31 pg (ref 26.0–34.0)
MCHC: 35.4 g/dL (ref 30.0–36.0)
MCV: 87.5 fL (ref 80.0–100.0)
Monocytes Absolute: 1.2 K/uL — ABNORMAL HIGH (ref 0.1–1.0)
Monocytes Relative: 14 %
Neutro Abs: 5 K/uL (ref 1.7–7.7)
Neutrophils Relative %: 56 %
Platelets: 363 K/uL (ref 150–400)
RBC: 5.62 MIL/uL (ref 4.22–5.81)
RDW: 12.2 % (ref 11.5–15.5)
WBC: 8.7 K/uL (ref 4.0–10.5)
nRBC: 0 % (ref 0.0–0.2)

## 2024-01-12 LAB — RESP PANEL BY RT-PCR (RSV, FLU A&B, COVID)  RVPGX2
Influenza A by PCR: NEGATIVE
Influenza B by PCR: NEGATIVE
Resp Syncytial Virus by PCR: NEGATIVE
SARS Coronavirus 2 by RT PCR: POSITIVE — AB

## 2024-01-12 LAB — CBG MONITORING, ED: Glucose-Capillary: 104 mg/dL — ABNORMAL HIGH (ref 70–99)

## 2024-01-12 MED ORDER — SODIUM CHLORIDE 0.9 % IV BOLUS
1000.0000 mL | Freq: Once | INTRAVENOUS | Status: AC
  Administered 2024-01-12: 1000 mL via INTRAVENOUS

## 2024-01-12 MED ORDER — ONDANSETRON HCL 4 MG PO TABS: 4.0000 mg | ORAL_TABLET | Freq: Three times a day (TID) | ORAL | 0 refills | Status: DC | PRN

## 2024-01-12 NOTE — ED Provider Notes (Signed)
 [facility]ERGENCY DEPARTMENT AT [facility] Provider Note   CSN: [id number] Arrival date & time: 01/12/24  1739     Patient presents with: Seizures and Cough   [name] is a 33 y.o. male who complains of decreased oral intake over the last 24 to 36 hours.  He has a history of intellectual delay as well as seizure disorder, does take Lamictal  and Keppra  for seizure management.  Further taken rosuvastatin  for hyperlipidemia as well as his propranolol .  Upon arrival to the ED, he apparently had an absence seizure where he appeared glazed over.  According to his mother who accompanies him to the ED today.  Patient does endorse a sore throat.    Seizures Cough Associated symptoms: sore throat        Prior to Admission medications   Medication Sig Start Date End Date Taking? Authorizing Provider  acetaminophen  (TYLENOL ) 500 MG tablet Take 1 tablet (500 mg total) by mouth every 8 (eight) hours as needed for mild pain or fever. 01/15/20   Lenon Marien CROME, MD  felbamate  (FELBATOL ) 600 MG tablet TAKE ONE TABLET BY MOUTH EVERY MORNING ONE-HALF TABLET EVERY NOON, AND TAKE ONE TABLET AT BEDTIME 01/03/24   Marianna City, NP  lamoTRIgine  (LAMICTAL ) 150 MG tablet Take 1 tablet (150 mg total) by mouth 2 (two) times daily. 07/11/23   Marianna City, NP  lamoTRIgine  (LAMICTAL ) 25 MG tablet Take 1 tablet (25 mg total) by mouth 2 (two) times daily. 07/11/23   Marianna City, NP  levETIRAcetam  (KEPPRA ) 500 MG tablet Take 1 tablet (500 mg total) by mouth 2 (two) times daily. 07/11/23   Marianna City, NP  predniSONE  (DELTASONE ) 5 MG tablet Take 1 tablet (5 mg total) by mouth daily. 12/06/23   Marianna City, NP  propranolol  (INDERAL ) 20 MG tablet Take 1 tablet (20 mg total) by mouth at bedtime. 07/11/23   Marianna City, NP  rosuvastatin  (CRESTOR ) 40 MG tablet Take 1 tablet (40 mg total) by mouth daily. 07/18/23   Celestia Rosaline SQUIBB, NP  traZODone  (DESYREL ) 50 MG tablet Take 1  tablet (50 mg total) by mouth at bedtime. 07/11/23   Marianna City, NP    Allergies: Dilantin [phenytoin sodium extended] and Chloral hydrate    Review of Systems  HENT:  Positive for sore throat.   Respiratory:  Positive for cough.   Neurological:  Positive for seizures.  All other systems reviewed and are negative.   Updated Vital Signs BP (!) 141/85 (BP Location: Right Arm)   Pulse 71   Temp 98.7 F (37.1 C) (Oral)   Resp 19   Ht 5' 6 (1.676 m)   Wt 90 kg   SpO2 99%   BMI 32.02 kg/m   Physical Exam Vitals and nursing note reviewed.  Constitutional:      General: He is awake. He is not in acute distress.    Appearance: Normal appearance. He is well-developed and well-groomed.     Comments: Has developmental delay, at baseline answers and 1-2 word phrases.  Per family, patient is at his baseline with this.  HENT:     Head: Normocephalic and atraumatic.     Mouth/Throat:     Mouth: Mucous membranes are moist.     Pharynx: Oropharynx is clear. Uvula midline. Posterior oropharyngeal erythema present.  Eyes:     Extraocular Movements: Extraocular movements intact.     Conjunctiva/sclera: Conjunctivae normal.     Pupils: Pupils are equal, round, and reactive to light.  Cardiovascular:     Rate and Rhythm: Normal rate and regular rhythm.     Pulses: Normal pulses.     Heart sounds: Normal heart sounds. No murmur heard.    No friction rub. No gallop.  Pulmonary:     Effort: Pulmonary effort is normal.     Breath sounds: Normal breath sounds.  Abdominal:     General: Abdomen is flat. Bowel sounds are normal.     Palpations: Abdomen is soft.  Musculoskeletal:        General: Normal range of motion.     Cervical back: Normal range of motion and neck supple.     Right lower leg: No edema.     Left lower leg: No edema.  Skin:    General: Skin is warm and dry.     Capillary Refill: Capillary refill takes less than 2 seconds.  Neurological:     General: No focal  deficit present.     Mental Status: He is alert. Mental status is at baseline.  Psychiatric:        Mood and Affect: Mood normal.        Behavior: Behavior is cooperative.     (all labs ordered are listed, but only abnormal results are displayed) Labs Reviewed  COMPREHENSIVE METABOLIC PANEL WITH GFR - Abnormal; Notable for the following components:      Result Value   Glucose, Bld 113 (*)    All other components within normal limits  CBC WITH DIFFERENTIAL/PLATELET - Abnormal; Notable for the following components:   Hemoglobin 17.4 (*)    Monocytes Absolute 1.2 (*)    All other components within normal limits  CBG MONITORING, ED - Abnormal; Notable for the following components:   Glucose-Capillary 104 (*)    All other components within normal limits  RESP PANEL BY RT-PCR (RSV, FLU A&B, COVID)  RVPGX2  URINALYSIS, ROUTINE W REFLEX MICROSCOPIC  LEVETIRACETAM  LEVEL  LAMOTRIGINE  LEVEL    EKG: None  Radiology: CT HEAD WO CONTRAST Result Date: 01/12/2024 CLINICAL DATA:  Seizure disorder, clinical change, decreased oral intake EXAM: CT HEAD WITHOUT CONTRAST TECHNIQUE: Contiguous axial images were obtained from the base of the skull through the vertex without intravenous contrast. RADIATION DOSE REDUCTION: This exam was performed according to the departmental dose-optimization program which includes automated exposure control, adjustment of the mA and/or kV according to patient size and/or use of iterative reconstruction technique. COMPARISON:  None Available. FINDINGS: Brain: Normal anatomic configuration. No abnormal intra or extra-axial mass lesion or fluid collection. No abnormal mass effect or midline shift. No evidence of acute intracranial hemorrhage or infarct. Ventricular size is normal. Cerebellum unremarkable. Vascular: Unremarkable Skull: Intact Sinuses/Orbits: Paranasal sinuses are clear. Orbits are unremarkable. Other: Mastoid air cells and middle ear cavities are clear.  IMPRESSION: 1. No acute intracranial abnormality. Electronically Signed   By: Dorethia Molt M.D.   On: 01/12/2024 19:39     Procedures   Medications Ordered in the ED - No data to display                                  Medical Decision Making Amount and/or Complexity of Data Reviewed Labs: ordered. Radiology: ordered.   Medical Decision Making:   STEDMAN SUMMERVILLE is a 33 y.o. male who presented to the ED today with cough as well as decreased oral intake and reported absence seizure.  Detailed above.  Additional history discussed with patient's family/caregivers.  External chart has been reviewed including previous labs and neurology consult. Patient's presentation is complicated by their history of seizure disorder and developmental delay.  Patient placed on continuous vitals and telemetry monitoring while in ED which was reviewed periodically.  Complete initial physical exam performed, notably the patient  was alert and oriented to his baseline.  No notable exam findings.    Reviewed and confirmed nursing documentation for past medical history, family history, social history.    Initial Assessment:   With the patient's presentation of cough along with decreased oral intake, consider differential diagnosis of acute viral syndrome, COVID/flu/RSV,  Initial Plan:  Provide IV fluid bolus due to decreased p.o. intake Screening labs including CBC and Metabolic panel to evaluate for infectious or metabolic etiology of disease.  Urinalysis with reflex culture ordered to evaluate for UTI or relevant urologic/nephrologic pathology.  CT of the head to evaluate for acute on chronic seizures Assess Keppra  and lamotrigine  levels. Objective evaluation as below reviewed   Initial Study Results:   Laboratory  All laboratory results reviewed without evidence of clinically relevant pathology.   Exceptions include: None  Radiology:  All images reviewed independently. Agree with radiology  report at this time.   CT HEAD WO CONTRAST Result Date: 01/12/2024 CLINICAL DATA:  Seizure disorder, clinical change, decreased oral intake EXAM: CT HEAD WITHOUT CONTRAST TECHNIQUE: Contiguous axial images were obtained from the base of the skull through the vertex without intravenous contrast. RADIATION DOSE REDUCTION: This exam was performed according to the departmental dose-optimization program which includes automated exposure control, adjustment of the mA and/or kV according to patient size and/or use of iterative reconstruction technique. COMPARISON:  None Available. FINDINGS: Brain: Normal anatomic configuration. No abnormal intra or extra-axial mass lesion or fluid collection. No abnormal mass effect or midline shift. No evidence of acute intracranial hemorrhage or infarct. Ventricular size is normal. Cerebellum unremarkable. Vascular: Unremarkable Skull: Intact Sinuses/Orbits: Paranasal sinuses are clear. Orbits are unremarkable. Other: Mastoid air cells and middle ear cavities are clear. IMPRESSION: 1. No acute intracranial abnormality. Electronically Signed   By: Dorethia Molt M.D.   On: 01/12/2024 19:39    Reassessment and Plan:   Lab workup thus far does not show any acute abnormalities, provided with IV fluid bolus to offset poor p.o. fluid intake.  CT imaging did not show any acute abnormalities at this time.  At this time pending Keppra  and lamotrigine  levels as well as respiratory panel swab which was obtained secondary to sore throat and acute change in his condition.  Further awaiting results of urinalysis as this was delayed secondary to patient's delay in ability to urinate.  Anticipate patient being stable for discharge if remainder of lab evaluation returns within normal limits, otherwise pending results of workup for ultimate disposition.  Care handoff to RONAL Pereyra, MD       Final diagnoses:  None    ED Discharge Orders     None          Myriam Dorn BROCKS,  [place] 01/12/24 2155    Pereyra Bernarda SQUIBB, DO 01/12/24 2328

## 2024-01-12 NOTE — ED Provider Triage Note (Signed)
 Emergency Medicine Provider Triage Evaluation Note  RAJAH TAGLIAFERRO , a 33 y.o. male  was evaluated in triage.  Pt complains of decreased oral intake over the last 24 to 36 hours.  He has a history of intellectual delay as well as seizure disorder, does take Lamictal  and Keppra  for seizure management.  Further taken rosuvastatin  for hyperlipidemia as well as his propranolol .  Upon arrival to the ED, he apparently had an absence seizure where he appeared glazed over.  According to his mother who accompanies him to the ED today.  Review of Systems  Positive: As above Negative:   Physical Exam  BP 133/87 (BP Location: Right Arm)   Pulse 80   Temp 98.6 F (37 C)   Resp 17   SpO2 92%  Gen:   Awake, no distress   Resp:  Normal effort  MSK:   Moves extremities without difficulty  Other:  Low verbal response at baseline however he does follow commands and is able to answer simple questions with 1-2 word answers.  Medical Decision Making  Medically screening exam initiated at 6:01 PM.  Appropriate orders placed.  KHOURY SIEMON was informed that the remainder of the evaluation will be completed by another provider, this initial triage assessment does not replace that evaluation, and the importance of remaining in the ED until their evaluation is complete.  Based on possible acute onset of chronic seizures, will begin lab workup for seizure disorder as well as initial imaging with head CT.   Myriam Dorn BROCKS, [place] 01/12/24 [phone]

## 2024-01-12 NOTE — ED Triage Notes (Signed)
 The pt has not been feeling well for several days cough not eating or drinking very much  he has absent seizures and the mother thinks he has one as he entered the ed  at present alert  does not speak normally.  He's alert and responds one word conversation

## 2024-01-12 NOTE — ED Provider Notes (Signed)
 Patient's care assumed by myself from PA Gilliam.  Physical Exam  BP (!) 141/85 (BP Location: Right Arm)   Pulse 71   Temp 98.7 F (37.1 C) (Oral)   Resp 19   Ht 5' 6 (1.676 m)   Wt 90 kg   SpO2 99%   BMI 32.02 kg/m   Physical Exam  Procedures  Procedures  ED Course / MDM    Medical Decision Making Amount and/or Complexity of Data Reviewed Labs: ordered. Radiology: ordered.   Patient COVID-positive.  Lightly resulting in patient's 24 hours of decreased appetite.  Recommend symptomatic treatment.  Safer discharge.  Patient in no distress and overall condition improved here in the ED. Detailed discussions were had with the patient regarding current findings, and need for close f/u with PCP or on call doctor. The patient has been instructed to return immediately if the symptoms worsen in any way for re-evaluation. Patient verbalized understanding and is in agreement with current care plan. All questions answered prior to discharge.     Daniel Bernarda SQUIBB, DO 01/12/24 2237

## 2024-01-12 NOTE — Discharge Instructions (Addendum)
 Patient was diagnosed with COVID.  Rest, increase fluids, increase vitamin C, return to emergency department for any airway difficulties.  Follow-up with PCP as needed.

## 2024-01-13 LAB — LEVETIRACETAM LEVEL: Levetiracetam Lvl: <2 ug/mL — ABNORMAL LOW (ref 10.0–40.0)

## 2024-01-13 LAB — LAMOTRIGINE LEVEL: Lamotrigine Lvl: 3 ug/mL (ref 2.0–20.0)

## 2024-01-26 ENCOUNTER — Encounter (INDEPENDENT_AMBULATORY_CARE_PROVIDER_SITE_OTHER): Payer: MEDICAID | Admitting: Primary Care

## 2024-01-26 ENCOUNTER — Ambulatory Visit: Payer: Self-pay

## 2024-01-26 NOTE — Telephone Encounter (Signed)
 Message routed to [facility] staff.

## 2024-01-26 NOTE — Telephone Encounter (Signed)
 FYI Only or Action Required?: FYI only for provider.  Patient was last seen in primary care on 07/15/2023 by Celestia Rosaline SQUIBB, NP.  Called Nurse Triage reporting Advice Only. .  Triage Disposition: Information or Advice Only Call  Patient/caregiver understands and will follow disposition?: Yes    Reason for Disposition  General information question, no triage required and triager able to answer question  Answer Assessment - Initial Assessment Questions This RN spoke with the patient's mom [name]. She states she would like to transfer care for her son to the same office as her new PCP. Appointment made by this RN.     1. REASON FOR CALL: What is the main reason for your call? or How can I best help you?     Patient's mom requesting to transfer care for her son.  2. SYMPTOMS : Do you have any symptoms?      None currently, per patient's mom he tested positive for Covid on 01/12/2024.  Protocols used: Information Only Call - No Triage-A-AH

## 2024-02-01 ENCOUNTER — Inpatient Hospital Stay: Payer: MEDICAID | Admitting: Family

## 2024-02-15 ENCOUNTER — Encounter (INDEPENDENT_AMBULATORY_CARE_PROVIDER_SITE_OTHER): Payer: MEDICAID | Admitting: Primary Care

## 2024-02-15 ENCOUNTER — Encounter: Payer: Self-pay | Admitting: Family

## 2024-02-15 ENCOUNTER — Ambulatory Visit: Payer: MEDICAID | Admitting: Family

## 2024-02-15 VITALS — BP 138/84 | HR 67 | Temp 98.0°F | Resp 19 | Ht 66.0 in | Wt 189.4 lb

## 2024-02-15 DIAGNOSIS — F5101 Primary insomnia: Secondary | ICD-10-CM | POA: Diagnosis not present

## 2024-02-15 DIAGNOSIS — R569 Unspecified convulsions: Secondary | ICD-10-CM

## 2024-02-15 DIAGNOSIS — G250 Essential tremor: Secondary | ICD-10-CM | POA: Diagnosis not present

## 2024-02-15 DIAGNOSIS — Z23 Encounter for immunization: Secondary | ICD-10-CM

## 2024-02-15 DIAGNOSIS — E782 Mixed hyperlipidemia: Secondary | ICD-10-CM

## 2024-02-15 DIAGNOSIS — R6250 Unspecified lack of expected normal physiological development in childhood: Secondary | ICD-10-CM

## 2024-02-15 DIAGNOSIS — Z7689 Persons encountering health services in other specified circumstances: Secondary | ICD-10-CM

## 2024-02-15 NOTE — Progress Notes (Signed)
 Provider: Roxan Plough FNP-C   [name], Roxan BROCKS, NP  Patient Care Team: Maylie Ashton, Roxan BROCKS, NP as PCP - General (Family Medicine)  Extended Emergency Contact Information Primary Emergency Contact: Acosta,Cynthia Address: [address]          [place], [place] [address] [place] Home Phone: [phone] Mobile Phone: [phone] Relation: Mother  Code Status:  Full Code  Goals of care: Advanced Directive information    02/15/2024    2:05 PM  Advanced Directives  Does Patient Have a Medical Advance Directive? No  Would patient like information on creating a medical advance directive? No - Patient declined     Chief Complaint  Patient presents with   Establish Care    New patient.    Discussed the use of AI scribe software for clinical note transcription with the patient, who gave verbal consent to proceed.  History of Present Illness   [name] is a 33 year old male with seizures and high cholesterol who presents to establish care. He is accompanied by his legal guardian, who is also his mother.  He has a history of seizures, with the most recent seizure activity occurring in September during a COVID-19 infection. He experiences grand mal seizures and episodes with eye rolling. He is currently taking Keppra  500 mg twice daily for seizure management and prednisone  5 mg daily, which he has been on for over ten years. He experiences essential tremors, managed with propranolol  20 mg daily at bedtime. No recent grand mal seizures.  He has high cholesterol and is on rosuvastatin  40 mg daily and fenofibrate 600 mg daily. His diet has been adjusted to reduce fried and processed foods. His weight has decreased from 198 lbs to 189 lbs. He enjoys eating vegetables and salmon as part of his dietary regimen.  He is very active, participating in a day program and engaging in sports such as basketball, flag football, and kickball. He recently won a championship  in basketball. He also enjoys playing video games at home.  He experiences sleep disturbances, often staying up late to play video games, affecting his morning routine. He takes trazodone  50 mg at bedtime to aid sleep. His guardian notes occasional red eyes in the morning due to late nights.  He has a history of surgery for acid reflux, specifically a Nissen fundoplication, with no current pain or gastrointestinal symptoms.  No vision changes, but his guardian is concerned about his ability to recognize letters. No issues with balance, though he has tremors in both hands.  He is currently taking Tylenol  500 mg as needed for pain, with a maximum of one every eight hours. He also has a prescription for Lomotil 150 mg twice daily and Zofran  4 mg for nausea, though he has not needed Zofran  recently.    Past Medical History:  Diagnosis Date   Asthma    Intellectual delay    Seizures [facility])    Past Surgical History:  Procedure Laterality Date   NISSEN FUNDOPLICATION  April 2009    Allergies  Allergen Reactions   Dilantin [Phenytoin Sodium Extended] Other (See Comments)    hallucinations   Chloral Hydrate Hives and Rash    Allergies as of 02/15/2024       Reactions   Dilantin [phenytoin Sodium Extended] Other (See Comments)   hallucinations   Chloral Hydrate Hives, Rash        Medication List        Accurate as of February 15, 2024  3:14 PM. If you have any questions, ask your nurse or doctor.          STOP taking these medications    ondansetron  4 MG tablet Commonly known as: ZOFRAN  Stopped by: Rudie Sermons C Caulder Wehner       TAKE these medications    acetaminophen  500 MG tablet Commonly known as: TYLENOL  Take 1 tablet (500 mg total) by mouth every 8 (eight) hours as needed for mild pain or fever.   felbamate  600 MG tablet Commonly known as: FELBATOL  TAKE ONE TABLET BY MOUTH EVERY MORNING ONE-HALF TABLET EVERY NOON, AND TAKE ONE TABLET AT BEDTIME   lamoTRIgine  150 MG  tablet Commonly known as: LAMICTAL  Take 1 tablet (150 mg total) by mouth 2 (two) times daily.   lamoTRIgine  25 MG tablet Commonly known as: LAMICTAL  Take 1 tablet (25 mg total) by mouth 2 (two) times daily.   levETIRAcetam  500 MG tablet Commonly known as: KEPPRA  Take 1 tablet (500 mg total) by mouth 2 (two) times daily.   predniSONE  5 MG tablet Commonly known as: DELTASONE  Take 1 tablet (5 mg total) by mouth daily.   propranolol  20 MG tablet Commonly known as: INDERAL  Take 1 tablet (20 mg total) by mouth at bedtime.   rosuvastatin  40 MG tablet Commonly known as: CRESTOR  Take 1 tablet (40 mg total) by mouth daily.   traZODone  50 MG tablet Commonly known as: DESYREL  Take 1 tablet (50 mg total) by mouth at bedtime.        Review of Systems  Unable to perform ROS: Other (Information provided by POA)  Constitutional:  Negative for appetite change, chills, fatigue, fever and unexpected weight change.  HENT:  Negative for congestion, dental problem, ear discharge, ear pain, facial swelling, hearing loss, nosebleeds, postnasal drip, rhinorrhea, sinus pressure, sinus pain, sneezing, sore throat, tinnitus and trouble swallowing.   Eyes:  Negative for pain, discharge, redness, itching and visual disturbance.  Respiratory:  Negative for cough, chest tightness, shortness of breath and wheezing.   Cardiovascular:  Negative for chest pain, palpitations and leg swelling.  Gastrointestinal:  Negative for abdominal distention, abdominal pain, blood in stool, constipation, diarrhea, nausea and vomiting.  Endocrine: Negative for cold intolerance, heat intolerance, polydipsia, polyphagia and polyuria.  Genitourinary:  Negative for difficulty urinating, dysuria, flank pain, frequency and urgency.  Musculoskeletal:  Negative for arthralgias, back pain, gait problem, joint swelling, myalgias, neck pain and neck stiffness.  Skin:  Negative for color change, pallor, rash and wound.  Neurological:   Positive for tremors. Negative for dizziness, syncope, speech difficulty, weakness, light-headedness, numbness and headaches.  Hematological:  Does not bruise/bleed easily.  Psychiatric/Behavioral:  Negative for agitation, behavioral problems, confusion, hallucinations, self-injury, sleep disturbance and suicidal ideas. The patient is not nervous/anxious.     Immunization History  Administered Date(s) Administered   Influenza, Seasonal, Injecte, Preservative Fre 02/15/2024   Influenza,inj,Quad PF,6+ Mos 06/02/2017   Tdap 12/06/2017   Pertinent  Health Maintenance Due  Topic Date Due   Influenza Vaccine  Completed      12/24/2020    8:58 PM 04/29/2021   10:04 AM 12/25/2021    8:27 PM 12/26/2021    8:32 AM 02/15/2024    2:05 PM  Fall Risk  Falls in the past year?  0   0  Was there an injury with Fall?     0  Fall Risk Category Calculator     0  (RETIRED) Patient Fall Risk Level Low fall risk  Low fall risk  Low fall risk  Low fall risk    Patient at Risk for Falls Due to     No Fall Risks  Fall risk Follow up     Falls evaluation completed     Data saved with a previous flowsheet row definition   Functional Status Survey:    Vitals:   02/15/24 1409  BP: 138/84  Pulse: 67  Resp: 19  Temp: 98 F (36.7 C)  SpO2: 98%  Weight: 189 lb 6.4 oz (85.9 kg)  Height: 5' 6 (1.676 m)   Body mass index is 30.57 kg/m. Physical Exam  VITALS: P- 61, BP- 138/84, SaO2- 98% MEASUREMENTS: Weight- 189.0. GENERAL: Alert, cooperative, well developed, no acute distress. HEENT: Normocephalic, normal oropharynx, moist mucous membranes, tympanic membranes normal bilaterally, nose normal, no sinus tenderness. NECK: Neck supple, full range of motion. CHEST: Clear to auscultation bilaterally, no wheezes, rhonchi, or crackles. CARDIOVASCULAR: Normal heart rate and rhythm, S1 and S2 normal without murmurs. ABDOMEN: Soft, non-tender, non-distended, without organomegaly, normal bowel  sounds. EXTREMITIES: No cyanosis, edema, or swelling. NEUROLOGICAL: Cranial nerves II-XII grossly intact except unable to touch tongue to roof of mouth, moves all extremities without gross motor or sensory deficit, motor strength 5/5 in all extremities, reflexes normal. Bilateral hand tremors SKIN: No rash,no lesion or erythema   PSYCHIATRY/BEHAVIORAL: Mood stable    Labs reviewed: Recent Labs    07/15/23 1154 01/12/24 1808  NA 140 137  K 4.3 3.8  CL 101 100  CO2 23 24  GLUCOSE 111* 113*  BUN 13 13  CREATININE 1.15 1.02  CALCIUM  10.0 9.0   Recent Labs    07/15/23 1154 01/12/24 1808  AST 18 29  ALT 20 38  ALKPHOS 107 91  BILITOT 0.5 1.1  PROT 7.9 8.1  ALBUMIN 5.0 4.5   Recent Labs    07/15/23 1154 01/12/24 1808  WBC 9.0 8.7  NEUTROABS 4.6 5.0  HGB 17.3 17.4*  HCT 52.3* 49.2  MCV 92 87.5  PLT 479* 363   Lab Results  Component Value Date   TSH 2.220 07/15/2023   Lab Results  Component Value Date   HGBA1C 5.3 07/15/2023   Lab Results  Component Value Date   CHOL 226 (H) 07/15/2023   HDL 34 (L) 07/15/2023   LDLCALC 170 (H) 07/15/2023   TRIG 118 07/15/2023   CHOLHDL 6.6 (H) 07/15/2023    Significant Diagnostic Results in last 30 days:  No results found.  Assessment/Plan  Seizure disorder Seizure disorder with grand mal and other types, last seizure in September during COVID-19 diagnosis. Currently managed with Keppra  500 mg twice daily, no recent grand mal seizures. - Continue Keppra  500 mg twice daily and Felbatol   - Discuss with neurologist regarding continued prednisone  use for seizure management - Check Keppra  levels to ensure therapeutic range - continue to follow up with Neurologist   Essential tremor Chronic essential tremor affecting both hands, exacerbated by anxiety. Managed with propranolol  20 mg daily at bedtime. - Continue propranolol  20 mg daily at bedtime  Hyperlipidemia Hyperlipidemia managed with rosuvastatin  40 mg daily and  fenofibrate. Dietary modifications include reduced intake of fried and processed foods. Recent weight loss indicates positive lifestyle changes. - Continue rosuvastatin  40 mg daily - Continue fenofibrate - Monitor cholesterol levels and liver function every six months - Encourage continued dietary modifications and regular exercise  Insomnia  - continue on Trazodone   - Sleep Hygiene advised   Develop Delay disorder  - continue  with supportive care   Family/ staff Communication: Reviewed plan of care with patient and POA verbalized understanding   Labs/tests ordered:  - CBC with Differential/Platelet - CMP with eGFR([facility]) - TSH - Lipid panel  Next Appointment : Return in about 6 months (around 08/15/2024) for medical mangement of chronic issues.SABRA   Spent 45 minutes of Face to face and non-face to face with patient  >50% time spent counseling; reviewing medical record; tests; labs; documentation and developing future plan of care.  Roxan JAYSON Plough, NP

## 2024-02-16 ENCOUNTER — Encounter (INDEPENDENT_AMBULATORY_CARE_PROVIDER_SITE_OTHER): Payer: MEDICAID | Admitting: Primary Care

## 2024-02-16 ENCOUNTER — Telehealth (INDEPENDENT_AMBULATORY_CARE_PROVIDER_SITE_OTHER): Payer: Self-pay | Admitting: Family

## 2024-02-16 NOTE — Telephone Encounter (Signed)
  Name of who is calling: Montie Millard Relationship to Patient: Mom  Best contact number: [phone]  Provider they see: Ellouise Bollman   Reason for call: Mom called in asking if her son was still needing to take the prednisone . She is requesting a call back.      PRESCRIPTION REFILL ONLY  Name of prescription:  Pharmacy:

## 2024-02-17 ENCOUNTER — Ambulatory Visit: Payer: Self-pay | Admitting: Family

## 2024-02-17 DIAGNOSIS — R569 Unspecified convulsions: Secondary | ICD-10-CM

## 2024-02-17 NOTE — Telephone Encounter (Signed)
 Would you let Mom know that [name] should continue to take the Prednisone  until I see him again. Please schedule an appointment with me to discuss further with Mom. Next available is fine.

## 2024-02-17 NOTE — Telephone Encounter (Signed)
 Contacted patients mother.  Verified patients name and DOB as well as mothers name.   I relayed the previous message to mom. Mom verbalized understanding of this.  Scheduled follow up.   SS, CCMA

## 2024-02-18 LAB — COMPLETE METABOLIC PANEL WITHOUT GFR
AG Ratio: 1.9 (calc) (ref 1.0–2.5)
ALT: 55 U/L — ABNORMAL HIGH (ref 9–46)
AST: 19 U/L (ref 10–40)
Albumin: 5 g/dL (ref 3.6–5.1)
Alkaline phosphatase (APISO): 81 U/L (ref 36–130)
BUN: 11 mg/dL (ref 7–25)
CO2: 28 mmol/L (ref 20–32)
Calcium: 9.7 mg/dL (ref 8.6–10.3)
Chloride: 103 mmol/L (ref 98–110)
Creat: 0.97 mg/dL (ref 0.60–1.26)
Globulin: 2.7 g/dL (ref 1.9–3.7)
Glucose, Bld: 103 mg/dL — ABNORMAL HIGH (ref 65–99)
Potassium: 4.4 mmol/L (ref 3.5–5.3)
Sodium: 137 mmol/L (ref 135–146)
Total Bilirubin: 0.6 mg/dL (ref 0.2–1.2)
Total Protein: 7.7 g/dL (ref 6.1–8.1)

## 2024-02-18 LAB — CBC WITH DIFFERENTIAL/PLATELET
Absolute Lymphocytes: 2474 {cells}/uL (ref 850–3900)
Absolute Monocytes: 614 {cells}/uL (ref 200–950)
Basophils Absolute: 84 {cells}/uL (ref 0–200)
Basophils Relative: 0.9 %
Eosinophils Absolute: 112 {cells}/uL (ref 15–500)
Eosinophils Relative: 1.2 %
HCT: 47.5 % (ref 38.5–50.0)
Hemoglobin: 16.1 g/dL (ref 13.2–17.1)
MCH: 31.8 pg (ref 27.0–33.0)
MCHC: 33.9 g/dL (ref 32.0–36.0)
MCV: 93.9 fL (ref 80.0–100.0)
MPV: 9.7 fL (ref 7.5–12.5)
Monocytes Relative: 6.6 %
Neutro Abs: 6017 {cells}/uL (ref 1500–7800)
Neutrophils Relative %: 64.7 %
Platelets: 393 Thousand/uL (ref 140–400)
RBC: 5.06 Million/uL (ref 4.20–5.80)
RDW: 12.8 % (ref 11.0–15.0)
Total Lymphocyte: 26.6 %
WBC: 9.3 Thousand/uL (ref 3.8–10.8)

## 2024-02-18 LAB — LEVETIRACETAM, IMMUNOASSAY: LEVETIRACETAM, IMMUNOASSAY: 10 ug/mL (ref 6.0–46.0)

## 2024-02-18 LAB — LIPID PANEL
Cholesterol: 120 mg/dL (ref <200)
HDL: 38 mg/dL — ABNORMAL LOW (ref >=40)
LDL Cholesterol (Calc): 66 mg/dL
Non-HDL Cholesterol (Calc): 82 mg/dL (ref <130)
Total CHOL/HDL Ratio: 3.2 (calc) (ref <5.0)
Triglycerides: 84 mg/dL (ref <150)

## 2024-02-18 LAB — HEMOGLOBIN A1C
Hgb A1c MFr Bld: 5.1 % (ref <5.7)
Mean Plasma Glucose: 100 mg/dL
eAG (mmol/L): 5.5 mmol/L

## 2024-02-18 LAB — TEST AUTHORIZATION

## 2024-02-18 LAB — TSH: TSH: 0.62 m[IU]/L (ref 0.40–4.50)

## 2024-03-08 ENCOUNTER — Ambulatory Visit (INDEPENDENT_AMBULATORY_CARE_PROVIDER_SITE_OTHER): Payer: MEDICAID | Admitting: Family

## 2024-03-08 ENCOUNTER — Encounter (INDEPENDENT_AMBULATORY_CARE_PROVIDER_SITE_OTHER): Payer: Self-pay | Admitting: Family

## 2024-03-08 VITALS — BP 128/70 | HR 80 | Ht 66.14 in | Wt 191.0 lb

## 2024-03-08 DIAGNOSIS — F71 Moderate intellectual disabilities: Secondary | ICD-10-CM | POA: Diagnosis not present

## 2024-03-08 DIAGNOSIS — F801 Expressive language disorder: Secondary | ICD-10-CM | POA: Diagnosis not present

## 2024-03-08 DIAGNOSIS — G40309 Generalized idiopathic epilepsy and epileptic syndromes, not intractable, without status epilepticus: Secondary | ICD-10-CM

## 2024-03-08 DIAGNOSIS — Z636 Dependent relative needing care at home: Secondary | ICD-10-CM

## 2024-03-08 DIAGNOSIS — G4700 Insomnia, unspecified: Secondary | ICD-10-CM | POA: Diagnosis not present

## 2024-03-08 DIAGNOSIS — Z7952 Long term (current) use of systemic steroids: Secondary | ICD-10-CM

## 2024-03-08 MED ORDER — PREDNISOLONE SODIUM PHOSPHATE 15 MG/5ML PO SOLN: ORAL | 0 refills | Status: DC

## 2024-03-08 NOTE — Patient Instructions (Addendum)
 These are instructions on how to taper and discontinue the Prednisone : Week #1 - Give 4.4ml every day for 7 days Week #2 - Give 3.8ml every day for 7 days Week #3 - Give 3.49ml every day for 7 days Week #4 - Give 2.78ml every day for 7 days Week #5 - Give 2.0ml every day for 7 days Week #6 - Give 1.62ml every day for 7 days Week #7 - Give 0.8ml every day for 7 days Week #8 - Give 0.4ml every day for 7 days Week #9 - STOP the medication  Let me know if [name] has any seizures or if you have any concerns.   Continue the other seizure medications as prescribed  Please plan to return for follow up in 4 months or sooner if needed

## 2024-03-08 NOTE — Progress Notes (Unsigned)
 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 He was diagnosed with GERD and a Nissan fundoplication procedure stopped the vomiting. He also has organic gait disorder, essential tremor and stuttering. He is taking and tolerating Prednisone , Felbatol , Lamictal , and Keppra , and has remained largely seizure free for some time. He has been prescribed Propranolol  for essential tremor and Trazodone  for sleep.  Today's concerns: He has remained seizure free since his last visit Mom is interested in him tapering off Prednisone . He has been taking Prednisone  5mg  since he was a very young child. He had been prescribed it following a diagnosis of infantile spasms.  Mom was prescribed a Medrol  dose pack for an illness and was surprised to be diagnosed with diabetes shortly thereafter. Her experience with the steroid has made her interested in [name] stopping the Prednisone  Mom admits to other stressors to the family since his last visit in that they had to move in with her daughter when her rental home was no longer available and she could not afford higher rent in places that she could find. That also resulted in the family dog having to go to foster care and has been greatly missed. Mom is also unable to work at this time due to her own health problems.  [name] has been otherwise generally healthy since he was last seen. No health concerns today other than previously mentioned.  Review of systems: Please see  HPI for neurologic and other pertinent review of systems. Otherwise all other systems were reviewed and were negative.  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 He has been admitted to [facility] with recurrent seizures. He was hospitalized November 08, 2004 with involuntary movements that were choreiform in nature. He has significant cognitive impairments, pervasive developmental delays in many domains, but no focal or lateralized neurologic findings.   MRI performed October 11, 2002 was normal. EEG Oct 05 2002 showed diffuse background slowing and multifocal sharply contoured slow-wave is principally in the central, parietal and temporal regions, right greater than left  Surgical history: Past Surgical History:  Procedure Laterality Date   ANTI-REFLUX     SURGERY   NISSEN FUNDOPLICATION  08/10/2007     Family history: family history includes Asthma in  his mother; Diabetes in his maternal grandmother and mother; GER disease in his mother; Heart disease in his maternal grandmother; Leukemia in his maternal grandmother; Thyroid disease in his mother and sister.   Social  history: Social History   Socioeconomic History   Marital status: Single    Spouse name: Not on file   Number of children: Not on file   Years of education: Not on file   Highest education level: Not on file  Occupational History   Not on file  Tobacco Use   Smoking status: Never    Passive exposure: Never   Smokeless tobacco: Never  Substance and Sexual Activity   Alcohol use: No    Alcohol/week: 0.0 standard drinks of alcohol   Drug use: No   Sexual activity: Never  Other Topics Concern   Not on file  Social History Narrative   [name] refuses to attend the [employer].    He lives with his mother and mother's boyfriend.    He enjoys basketball, riding his bike, and watching wrestling.    Social Drivers of Corporate Investment Banker Strain: Not on file  Food Insecurity: Not on file  Transportation Needs: Not on file  Physical Activity: Not on file  Stress: Not on file  Social Connections: Not on file  Intimate Partner Violence: Not on file    Past/failed meds: Copied from previous record: Dilantin - hallucinations    Allergies: Allergies  Allergen Reactions   Dilantin [Phenytoin Sodium Extended] Other (See Comments)    hallucinations   Chloral Hydrate Hives and Rash    Immunizations: Immunization History  Administered Date(s) Administered   Influenza, Seasonal, Injecte, Preservative Fre 02/15/2024   Influenza,inj,Quad PF,6+ Mos 06/02/2017   Tdap 12/06/2017   Diagnostics/Screenings: Copied from previous record: 06/01/17 - rEEG - normal   Physical Exam: BP 128/70 (BP Location: Left Arm, Patient Position: Sitting, Cuff Size: Large)   Pulse 80   Ht 5' 6.14 (1.68 m)   Wt 191 lb (86.6 kg)   BMI 30.70 kg/m   General: well developed, well nourished man, seated on exam table, in no evident distress Head: normocephalic and atraumatic. No dysmorphic features. Neck: supple Musculoskeletal: No skeletal deformities or obvious scoliosis Skin: no rashes  or neurocutaneous lesions  Neurologic Exam Mental Status: Awake and fully alert.  Limited spech. Variable eye contact. Had difficulty following instructions for examination. Cranial Nerves: Turns to localize faces, objects and sounds in the periphery. Facial sensation intact.  Face, tongue, palate move normally and symmetrically. Motor: Normal functional bulk, tone and strength Sensory: Withdrawal x 4 Coordination: No dysmetria with reach for objects Gait and Station: Arises from chair, without difficulty. Stance is normal.  Gait demonstrates normal stride length and balance.   Impression: Generalized convulsive epilepsy (HCC)  Long term systemic steroid user - Plan: predniSONE  (DELTASONE ) 1 MG tablet, DISCONTINUED: prednisoLONE (ORAPRED) 15 MG/5ML solution  Caregiver stress - Plan: AMB Referral [facility]BCI Care Management  Generalized nonconvulsive epilepsy (HCC)  Moderate intellectual disabilities  Expressive speech disorder  Insomnia, unspecified type   Recommendations for plan of care: The patient's previous Epic records were reviewed. No recent diagnostic studies to be reviewed with the patient. I talked with Mom at length about tapering the Prednisone  and gave her options for doing this. We elected to taper by using 1mg  tablets as it would be easier for Mom than using prednisolone suspension. Mom was given written instructions on how to taper the medication.  Plan until next visit:  Taper Prednisone  as instructed.  Continue other medications as prescribed  Referral placed to care management for stressors in the family Call if seizures occur or for other questions or concerns Return in about 4 months (around 07/08/2024).  The medication list was reviewed and reconciled. No changes were made in the prescribed medications today. A complete medication list was provided to the patient.  Orders Placed This Encounter  Procedures   AMB Referral VBCI Care Management    Referral Priority:    Routine    Referral Type:   Consultation    Referral Reason:   Care Coordination    Number of Visits Requested:   1   Allergies as of 03/08/2024       Reactions   Dilantin [phenytoin Sodium Extended] Other (See Comments)   hallucinations   Chloral Hydrate Hives, Rash        Medication List        Accurate as of March 08, 2024 11:59 PM. If you have any questions, ask your nurse or doctor.          acetaminophen  500 MG tablet Commonly known as: TYLENOL  Take 1 tablet (500 mg total) by mouth every 8 (eight) hours as needed for mild pain or fever.   felbamate  600 MG tablet Commonly known as: FELBATOL  TAKE ONE TABLET BY MOUTH EVERY MORNING ONE-HALF TABLET EVERY NOON, AND TAKE ONE TABLET AT BEDTIME   lamoTRIgine  150 MG tablet Commonly known as: LAMICTAL  Take 1 tablet (150 mg total) by mouth 2 (two) times daily.   lamoTRIgine  25 MG tablet Commonly known as: LAMICTAL  Take 1 tablet (25 mg total) by mouth 2 (two) times daily.   levETIRAcetam  500 MG tablet Commonly known as: KEPPRA  Take 1 tablet (500 mg total) by mouth 2 (two) times daily.   predniSONE  1 MG tablet Commonly known as: DELTASONE  Give as directed. What changed:  medication strength how much to take how to take this when to take this additional instructions Changed by: [name]   propranolol  20 MG tablet Commonly known as: INDERAL  Take 1 tablet (20 mg total) by mouth at bedtime.   rosuvastatin  40 MG tablet Commonly known as: CRESTOR  Take 1 tablet (40 mg total) by mouth daily.   traZODone  50 MG tablet Commonly known as: DESYREL  Take 1 tablet (50 mg total) by mouth at bedtime.      I discussed this patient's care with Dr Waddell to develop this assessment and plan.  Total time spent with the patient was 40 minutes, of which 50% or more was spent in counseling and coordination of care.  Daniel Bollman NP-[name] [facility] [address] [place], [place] [address] Ph. [phone] Fax [phone]

## 2024-03-09 ENCOUNTER — Encounter (INDEPENDENT_AMBULATORY_CARE_PROVIDER_SITE_OTHER): Payer: Self-pay

## 2024-03-09 MED ORDER — PREDNISONE 1 MG PO TABS: ORAL_TABLET | ORAL | 0 refills | Status: AC

## 2024-03-10 ENCOUNTER — Encounter (INDEPENDENT_AMBULATORY_CARE_PROVIDER_SITE_OTHER): Payer: Self-pay | Admitting: Family

## 2024-03-10 ENCOUNTER — Telehealth: Payer: Self-pay

## 2024-03-10 DIAGNOSIS — Z636 Dependent relative needing care at home: Secondary | ICD-10-CM | POA: Insufficient documentation

## 2024-03-10 DIAGNOSIS — Z7952 Long term (current) use of systemic steroids: Secondary | ICD-10-CM | POA: Insufficient documentation

## 2024-03-10 NOTE — Progress Notes (Signed)
 Complex Care Management Note Care Guide Note  03/10/2024 Name: [name] MRN: [id number] DOB: 07-28-1990   Complex Care Management Outreach Attempts: An unsuccessful telephone outreach was attempted today to offer the patient information about available complex care management services.  Follow Up Plan:  Additional outreach attempts will be made to offer the patient complex care management information and services.   Encounter Outcome:  No Answer  Jeoffrey Buffalo , RMA     [place]  [facility], [facility] Guide  Direct Dial: [phone]  Website: [email or web address]

## 2024-03-13 NOTE — Progress Notes (Signed)
 Complex Care Management Note  Care Guide Note 03/13/2024 Name: MATILDE MARKIE MRN: [id number] DOB: Sep 19, 1990  MAGUIRE SIME is a 33 y.o. year old male who sees Ngetich, Dinah C, NP for primary care. I reached out to Mabel JINNY Coe by phone today to offer complex care management services.  Mr. Hartlage was given information about Complex Care Management services today including:   The Complex Care Management services include support from the care team which includes your Nurse Care Manager, Clinical Social Worker, or Pharmacist.  The Complex Care Management team is here to help remove barriers to the health concerns and goals most important to you. Complex Care Management services are voluntary, and the patient may decline or stop services at any time by request to their care team member.   Complex Care Management Consent Status: Patient agreed to services and verbal consent obtained.   Follow up plan:  Telephone appointment with complex care management team member scheduled for:  BSW 03/15/2024  Encounter Outcome:  Patient Scheduled  Jeoffrey Buffalo , RMA       [facility], [facility] Guide  Direct Dial: [phone]  Website: [email or web address]

## 2024-03-15 ENCOUNTER — Other Ambulatory Visit: Payer: MEDICAID | Admitting: Licensed Clinical Social Worker

## 2024-03-15 NOTE — Patient Instructions (Signed)
 Visit Information  Thank you for taking time to visit with me today.   Tailored Plan Medicaid On November 09, 2022 some people on [place] Medicaid will move to a new kind of Medicaid health plan called a Tailored Plan. Tailored Plans cover your doctor visits, prescription drugs, and health care services.    If your [place] Medicaid will move to a Tailored Plan, you should have gotten a letter and welcome packet. If you're not sure, call your [place] Medicaid Enrollment Broker at [phone] and ask.  Check out these free materials, in Spanish and English, to learn more about your Tailored Plan: [email or web address]/Tailored-Plans/Toolkit  Tailored Care Management Services  TCM services are available to you now. If you are a Tailored Plan member or will be and want information about Tailored Care Management Services including rides to appointments and community and home services, call the Care Management provider for your county of residence:    [facility] [place], [place])  Member Services: [phone] Behavioral Health Crisis Line: [phone]) [phone], [place], [place], [place], [place])  Member Services: [phone] Behavioral Health Crisis Line: [phone]     Please call the Suicide and Crisis Lifeline: 988 go to [facility] [address], [place] [phone]) call 911 if you are experiencing a Mental Health or Behavioral Health Crisis or need someone to talk to.  SW will call with update on the assigned Case manager with [name] Daniel Maranda [name], PhD [facility], [facility] Social Worker Direct Dial: [phone]  Fax: [phone]

## 2024-03-15 NOTE — Patient Outreach (Signed)
 Dr. Maranda -  I received a notice that you had reached out to [facility] earlier today concerning Yuya Vanwingerden (DOB 1990/05/14).  I am his assigned Care Manager here at [facility] and my contact information is listed below.  I had returned your call and left a voice mail and this email is just to ensure that you have my direct contact information.  Please feel free to contact me at your convenience.   *Please use the cell number as it rings directly to me, the phone number listed will route you through the call center.  Respectfully,  Glendia Kitchen Care Manager I/DD [facility] [email or web address]   Cell: [phone] P: [phone] Fax: [phone]  Member & Recipient Service Line: [phone]  Provider Support Service Line: [phone]   Good afternoon,   We received your PCP/Provider Request for Care Manager Name or Assignment Referral Form. The member Tamario Heal 11/13/1990,  is currently assigned to Glendia Kitchen at [facility]. The care manager or a member of the care team will contact you.   Thank you Waldon Ellen       [facility] Population Health Operations Coordinator Pronouns: She, Her, Her   [email or web address]  P [phone] F [phone]

## 2024-03-15 NOTE — Patient Outreach (Signed)
 03/15/2024  Sw spoke with the patients mother/guardian [name], SW explained the reason for the call and that the patient is a [facility] patient and that the SW will contact [facility] to contact the patient with Case manager, and get back with the patient either by today or by the end of the week. SW also notified that the LCSW has canceled the 04/11/2024 phone visit.    SW will call back once the response form [facility] is received regarding who the CM assigned.    Daniel CHARM Maranda [name], PhD [facility], [facility] Social Worker Direct Dial: [phone]  Fax: [phone]

## 2024-03-15 NOTE — Patient Outreach (Signed)
 SW Received an email form [facility] and from the CM Scott Royle and he stated that he has been working with the family for about 9 years and just had a face to air cabin crew. SW proceeded to call the patients mother/guardian and explain that Mr. Clemens is the assigned CM, the mother stated yes she is familiar with Mr. Glendia, she thought that were talking about another CM. SW explained to contact Mr. Royle for any SDOH needs.  SW will cancel any future CCM [facility] appointments  Tobias CHARM Maranda [name], PhD [facility], [facility] Social Worker Direct Dial: [phone]  Fax: [phone]

## 2024-03-20 ENCOUNTER — Other Ambulatory Visit: Payer: MEDICAID

## 2024-03-20 DIAGNOSIS — R569 Unspecified convulsions: Secondary | ICD-10-CM

## 2024-03-21 ENCOUNTER — Ambulatory Visit: Payer: Self-pay | Admitting: Family

## 2024-03-21 ENCOUNTER — Other Ambulatory Visit: Payer: Self-pay | Admitting: Family

## 2024-03-21 DIAGNOSIS — E782 Mixed hyperlipidemia: Secondary | ICD-10-CM

## 2024-03-21 MED ORDER — ROSUVASTATIN CALCIUM 20 MG PO TABS: 20.0000 mg | ORAL_TABLET | Freq: Every day | ORAL | 1 refills | Status: AC

## 2024-03-21 NOTE — Telephone Encounter (Signed)
 Rosuvasatin medication is pended/ medication wasn't prescribed by pcp is it okay to send to pharmacy ?

## 2024-03-22 ENCOUNTER — Other Ambulatory Visit: Payer: MEDICAID | Admitting: Licensed Clinical Social Worker

## 2024-03-22 LAB — HEPATIC FUNCTION PANEL
AG Ratio: 1.8 (calc) (ref 1.0–2.5)
ALT: 92 U/L — ABNORMAL HIGH (ref 9–46)
AST: 46 U/L — ABNORMAL HIGH (ref 10–40)
Albumin: 5.2 g/dL — ABNORMAL HIGH (ref 3.6–5.1)
Alkaline phosphatase (APISO): 85 U/L (ref 36–130)
Bilirubin, Direct: 0.1 mg/dL (ref 0.0–0.2)
Globulin: 2.9 g/dL (ref 1.9–3.7)
Indirect Bilirubin: 0.6 mg/dL (ref 0.2–1.2)
Total Bilirubin: 0.7 mg/dL (ref 0.2–1.2)
Total Protein: 8.1 g/dL (ref 6.1–8.1)

## 2024-03-22 LAB — LEVETIRACETAM, IMMUNOASSAY: LEVETIRACETAM, IMMUNOASSAY: 14.4 ug/mL (ref 6.0–46.0)

## 2024-03-29 ENCOUNTER — Other Ambulatory Visit (INDEPENDENT_AMBULATORY_CARE_PROVIDER_SITE_OTHER): Payer: Self-pay | Admitting: Family

## 2024-03-29 DIAGNOSIS — G250 Essential tremor: Secondary | ICD-10-CM

## 2024-03-29 DIAGNOSIS — G4700 Insomnia, unspecified: Secondary | ICD-10-CM

## 2024-03-29 DIAGNOSIS — G40309 Generalized idiopathic epilepsy and epileptic syndromes, not intractable, without status epilepticus: Secondary | ICD-10-CM

## 2024-04-11 ENCOUNTER — Telehealth: Payer: MEDICAID | Admitting: Licensed Clinical Social Worker

## 2024-06-06 ENCOUNTER — Telehealth (INDEPENDENT_AMBULATORY_CARE_PROVIDER_SITE_OTHER): Payer: Self-pay | Admitting: Family

## 2024-06-06 NOTE — Telephone Encounter (Signed)
"  °  Name of who is calling:  Montie Millard Relationship to Patient: MOM  Best contact number: [phone]  Provider they see: Ellouise Bollman  Reason for call: Mom is calling in for a new prescription request for AREOFLOW     PRESCRIPTION REFILL ONLY  Name of prescription: Whips, Gloves, Pads  Pharmacy:   "

## 2024-06-09 NOTE — Telephone Encounter (Signed)
 Mom called asking for an update

## 2024-06-15 NOTE — Telephone Encounter (Signed)
 Following up - orders were sent to [facility] last week. Please let Mom know.

## 2024-06-15 NOTE — Telephone Encounter (Signed)
 Contacted patients mother.  Verified patients name and DOB as well as mothers name.   I informed mom of the paperwork being sent last week.   Mom verbalized understanding of this.   SS, CCMA

## 2024-07-12 ENCOUNTER — Ambulatory Visit (INDEPENDENT_AMBULATORY_CARE_PROVIDER_SITE_OTHER): Payer: MEDICAID | Admitting: Family

## 2024-08-14 ENCOUNTER — Ambulatory Visit: Payer: MEDICAID | Admitting: Family
